# Patient Record
Sex: Male | Born: 1965
Health system: Southern US, Community
[De-identification: ages and names within clinical notes are randomized; demographics above are authoritative.]

## PROBLEM LIST (undated history)

## (undated) DIAGNOSIS — M1990 Unspecified osteoarthritis, unspecified site: Secondary | ICD-10-CM

## (undated) DIAGNOSIS — R079 Chest pain, unspecified: Secondary | ICD-10-CM

## (undated) DIAGNOSIS — F419 Anxiety disorder, unspecified: Secondary | ICD-10-CM

## (undated) DIAGNOSIS — Z8719 Personal history of other diseases of the digestive system: Secondary | ICD-10-CM

## (undated) DIAGNOSIS — R0600 Dyspnea, unspecified: Secondary | ICD-10-CM

## (undated) DIAGNOSIS — K219 Gastro-esophageal reflux disease without esophagitis: Secondary | ICD-10-CM

## (undated) HISTORY — PX: KNEE ARTHROSCOPY: SHX127

## (undated) HISTORY — PX: ANKLE SURGERY: SHX546

## (undated) HISTORY — PX: HIP SURGERY: SHX245

## (undated) HISTORY — PX: FOOT SURGERY: SHX648

## (undated) HISTORY — PX: URETHRA SURGERY: SHX824

---

## 1998-07-06 ENCOUNTER — Emergency Department (HOSPITAL_COMMUNITY): Admission: EM | Admit: 1998-07-06 | Discharge: 1998-07-06 | Payer: Self-pay | Admitting: Cardiology

## 1998-07-06 ENCOUNTER — Encounter: Payer: Self-pay | Admitting: Emergency Medicine

## 2000-09-18 ENCOUNTER — Encounter: Admission: RE | Admit: 2000-09-18 | Discharge: 2000-09-18 | Payer: Self-pay | Admitting: Family Medicine

## 2002-06-09 ENCOUNTER — Emergency Department (HOSPITAL_COMMUNITY): Admission: EM | Admit: 2002-06-09 | Discharge: 2002-06-10 | Payer: Self-pay | Admitting: Emergency Medicine

## 2002-08-18 ENCOUNTER — Emergency Department (HOSPITAL_COMMUNITY): Admission: EM | Admit: 2002-08-18 | Discharge: 2002-08-18 | Payer: Self-pay | Admitting: Emergency Medicine

## 2002-11-16 ENCOUNTER — Emergency Department (HOSPITAL_COMMUNITY): Admission: EM | Admit: 2002-11-16 | Discharge: 2002-11-16 | Payer: Self-pay | Admitting: Emergency Medicine

## 2002-11-16 ENCOUNTER — Encounter: Payer: Self-pay | Admitting: Emergency Medicine

## 2003-04-05 ENCOUNTER — Emergency Department (HOSPITAL_COMMUNITY): Admission: EM | Admit: 2003-04-05 | Discharge: 2003-04-05 | Payer: Self-pay | Admitting: Emergency Medicine

## 2003-04-05 ENCOUNTER — Encounter: Payer: Self-pay | Admitting: Emergency Medicine

## 2003-06-10 ENCOUNTER — Emergency Department (HOSPITAL_COMMUNITY): Admission: EM | Admit: 2003-06-10 | Discharge: 2003-06-10 | Payer: Self-pay | Admitting: *Deleted

## 2003-08-31 ENCOUNTER — Emergency Department (HOSPITAL_COMMUNITY): Admission: EM | Admit: 2003-08-31 | Discharge: 2003-08-31 | Payer: Self-pay | Admitting: Emergency Medicine

## 2003-10-09 ENCOUNTER — Ambulatory Visit (HOSPITAL_COMMUNITY): Admission: RE | Admit: 2003-10-09 | Discharge: 2003-10-09 | Payer: Self-pay | Admitting: *Deleted

## 2003-10-20 ENCOUNTER — Ambulatory Visit (HOSPITAL_COMMUNITY): Admission: RE | Admit: 2003-10-20 | Discharge: 2003-10-20 | Payer: Self-pay | Admitting: Orthopedic Surgery

## 2003-11-07 ENCOUNTER — Emergency Department (HOSPITAL_COMMUNITY): Admission: EM | Admit: 2003-11-07 | Discharge: 2003-11-07 | Payer: Self-pay | Admitting: Emergency Medicine

## 2003-11-09 ENCOUNTER — Ambulatory Visit (HOSPITAL_COMMUNITY): Admission: RE | Admit: 2003-11-09 | Discharge: 2003-11-09 | Payer: Self-pay | Admitting: Gastroenterology

## 2003-11-09 HISTORY — PX: ESOPHAGOGASTRODUODENOSCOPY: SHX1529

## 2003-11-11 ENCOUNTER — Ambulatory Visit (HOSPITAL_COMMUNITY): Admission: RE | Admit: 2003-11-11 | Discharge: 2003-11-11 | Payer: Self-pay | Admitting: Gastroenterology

## 2003-11-29 ENCOUNTER — Emergency Department (HOSPITAL_COMMUNITY): Admission: EM | Admit: 2003-11-29 | Discharge: 2003-11-29 | Payer: Self-pay | Admitting: Emergency Medicine

## 2004-03-01 ENCOUNTER — Emergency Department (HOSPITAL_COMMUNITY): Admission: EM | Admit: 2004-03-01 | Discharge: 2004-03-01 | Payer: Self-pay | Admitting: Emergency Medicine

## 2004-07-20 ENCOUNTER — Encounter: Admission: RE | Admit: 2004-07-20 | Discharge: 2004-07-20 | Payer: Self-pay | Admitting: Family Medicine

## 2005-01-03 ENCOUNTER — Emergency Department (HOSPITAL_COMMUNITY): Admission: EM | Admit: 2005-01-03 | Discharge: 2005-01-03 | Payer: Self-pay | Admitting: Emergency Medicine

## 2005-02-01 ENCOUNTER — Emergency Department (HOSPITAL_COMMUNITY): Admission: EM | Admit: 2005-02-01 | Discharge: 2005-02-01 | Payer: Self-pay | Admitting: Emergency Medicine

## 2006-04-07 ENCOUNTER — Encounter: Admission: RE | Admit: 2006-04-07 | Discharge: 2006-04-07 | Payer: Self-pay | Admitting: Gastroenterology

## 2006-09-08 ENCOUNTER — Encounter: Admission: RE | Admit: 2006-09-08 | Discharge: 2006-09-08 | Payer: Self-pay | Admitting: Family Medicine

## 2006-10-23 ENCOUNTER — Ambulatory Visit: Payer: Self-pay | Admitting: Internal Medicine

## 2006-10-24 ENCOUNTER — Ambulatory Visit (HOSPITAL_COMMUNITY): Admission: RE | Admit: 2006-10-24 | Discharge: 2006-10-24 | Payer: Self-pay | Admitting: Internal Medicine

## 2006-11-25 ENCOUNTER — Ambulatory Visit: Payer: Self-pay | Admitting: Internal Medicine

## 2006-12-16 ENCOUNTER — Ambulatory Visit: Payer: Self-pay | Admitting: Internal Medicine

## 2008-02-29 ENCOUNTER — Emergency Department (HOSPITAL_COMMUNITY): Admission: EM | Admit: 2008-02-29 | Discharge: 2008-02-29 | Payer: Self-pay | Admitting: Emergency Medicine

## 2008-04-16 ENCOUNTER — Emergency Department (HOSPITAL_COMMUNITY): Admission: EM | Admit: 2008-04-16 | Discharge: 2008-04-16 | Payer: Self-pay | Admitting: Emergency Medicine

## 2010-07-21 ENCOUNTER — Encounter: Payer: Self-pay | Admitting: Gastroenterology

## 2010-11-13 NOTE — Assessment & Plan Note (Signed)
[facility]                             PULMONARY OFFICE NOTE   NAME:MCCARTYLamarcus, [name]                       MRN:          [id number]  DATE:12/16/2006                            DOB:          [birth year]    PROBLEMS:  1. Chest and back pain.  2. Leg discomfort.  3. Dyspnea.   HISTORY:  He says he is sleeping poorly because crawling feeling in his  legs gets oppressive when he relaxes and keeps him awake at night.  Complains of being tired all the time, tossing and turning, catches naps  in his car.  Still has back and bilateral rib pains.   MEDICATION:  None.   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   OBJECTIVE:  Weight is 227 pounds, BP 112/62, pulse 92, room air  saturation 99%.  He is alert, maybe just a little bit depressed affect.  He is not restless, there is no tremor.  Heart sounds are regular, chest  is quiet and clear.   An MRI of his spine without contrast on October 24, 2006, showed  degenerative disc disease from T4-5 through T9-10 with no significant  abnormalities.  There was also some arthritic change in the sternum.  Pulmonary function tests on May 27 was normal except for mild slowing of  small airway flows which did respond significantly to bronchodilator,  suggesting an asthma/bronchitis consistent with his smoking.   IMPRESSION:  1. Musculoskeletal chest wall pain with degenerative disc disease.  2. Restless legs.  3. Insomnia.  4. History of bladder cancer.  5. History Legg Perthes disease with surgery.   PLAN:  1. Smoking cessation was emphasized.  2. Sleep hygiene.  3. He will follow up with his primary physicians about the      degenerative arthritis in his spine and sternum, which I think      explain the chest pains.  4. Try sample of ReQuip 0.25 mg from one to three tablets at h.s. as      discussed.  Watch for effect on daytime tiredness.   Schedule  return 3 months, earlier p.r.n.     Clinton D. Maple Hudson, MD, Tonny Bollman, FACP  Electronically Signed    CDY/MedQ  DD: 12/16/2006  DT: 12/17/2006  Job #: [id number]   cc:   Marjory Lies, M.D.

## 2010-11-16 NOTE — Op Note (Signed)
NAME:  Ryan [name], Ryan [name]                          ACCOUNT NO.:  [id number]   MEDICAL RECORD NO.:  [id number]                   PATIENT TYPE:  AMB   LOCATION:  ENDO                                 FACILITY:  [facility]   PHYSICIAN:  John C. Madilyn Fireman, M.D.                 DATE OF BIRTH:  [birth year]   DATE OF PROCEDURE:  11/09/2003  DATE OF DISCHARGE:                                 OPERATIVE REPORT   PROCEDURE:  Esophagogastroduodenoscopy.   INDICATION FOR PROCEDURE:  A 3 week history of atypical chest and abdominal  pain with apparent negative cardiac work-up and thus far failure to respond  to a proton pump inhibitor.   DESCRIPTION OF PROCEDURE:  The patient was placed in the left lateral  decubitus position and placed on the pulse monitor with continuous low-flow  oxygen delivered by nasal cannula.  He was sedated with 100 mcg IV fentanyl  and 10 mg IV Versed.  The Olympus video endoscope was advanced under direct  vision into the oropharynx and esophagus.  The esophagus was straight and of  normal caliber with the squamocolumnar line at 38 cm.  There was no visible  hiatal hernia, ring, stricture, or other abnormality of the GE junction.  The stomach was entered, and a small amount of liquid secretions were  suctioned from the fundus.  Retroflexed view of the cardia was unremarkable.  The fundus, body, antrum, and pylorus all appeared normal.  The duodenum was  entered, and both the bulb and second portion were well-inspected and  appeared to be within normal limits.  The scope was then withdrawn, and the  patient returned to the recovery room in stable condition.  He tolerated the  procedure well, and there were no immediate complications.   IMPRESSION:  Normal endoscopy.   PLAN:  Will obtain ultrasound of the liver, gallbladder, and pancreas and  obtain amylase, lipase, and liver function tests.                                               John C. Madilyn Fireman, M.D.    JCH/MEDQ  D:   11/09/2003  T:  11/09/2003  Job:  [id number]

## 2010-11-16 NOTE — Op Note (Signed)
NAME:  Ryan [name], Ryan [name]                          ACCOUNT NO.:  [id number]   MEDICAL RECORD NO.:  [id number]                   PATIENT TYPE:  AMB   LOCATION:  DAY                                  FACILITY:  [facility]   PHYSICIAN:  Georges Lynch. Gioffre, M.D.             DATE OF BIRTH:  03-02-1966   DATE OF PROCEDURE:  10/20/2003  DATE OF DISCHARGE:                                 OPERATIVE REPORT   PREOPERATIVE DIAGNOSES:  1. Possible tear of the lateral meniscus.  2. Possible tear of the medial meniscus of his left knee.   POSTOPERATIVE DIAGNOSES:  1. Complete tear of the lateral meniscus left knee.  2. Partial tear of the medial meniscus left knee.  3. 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   Note, these findings were definitely different from what the MRI showed.  He  complained about a painful popping over the lateral joint and I think it was  due to his lateral meniscus.   OPERATION:  1. Diagnostic arthroscopy, left knee.  2. Lateral meniscectomy, left knee.  3. Partial medial meniscectomy, left knee.  4. Abrasion chondroplasty medial femoral condyle, left knee.   SURGEON:  Georges Lynch. Darrelyn Hillock, M.D.   ASSISTANT:  Nurse.   DESCRIPTION OF PROCEDURE:  Under general anesthesia, routine orthopedic prep  and draping of the left lower extremity was carried out.  He was given 1 g  of IV Ancef preop.  A small punctate incision was made in the suprapatellar  pouch, inflow cannula was inserted and the knee was distended with saline.  Following that, another small punctate incision was made in the  anterolateral joint space, the arthroscope was entered and a complete  diagnostic arthroscopy was carried out.  The suprapatellar pouch was fine  except for some synovitis. Medial joint he had a peripheral tear of the  medial meniscus. I introduced the shaver suction device and did a medial  meniscectomy.  He also had chondromalacia of his femur.  I did a slight  abrasion  chondroplasty.  The cruciate's were intact, the lateral joint was  the major marked.  He had a marked overgrowth of proliferative synovium in  the lateral gutter.  I first cleaned that out and then noticed that the  reason for that was he had a complete detachment of his lateral meniscus.  I  introduced the shaver suction device and practically did a complete lateral  meniscectomy except for the posterior horn which was intact.  I thoroughly  irrigated out the knee, removed all the fluid, looked up underneath his knee  prior to doing this up under the patella and I saw no patellar defect. I  thoroughly irrigated the knee out, removed the fluid, closed all three  punctate incisions with 3-0 nylon suture. Sterile dressings were applied  after we injected 20 mL of 0.5% Marcaine and epinephrine into the knee  joint.  He was  also given 30 mg of IV Toradol.  The  patient left the operating room in satisfactory condition. He will be on  Bufferin 2 a day as an anticoagulant for two weeks and will be on Mepergan  fortis for pain.  I will see him in 12-14 days in the office for a followup  or prior to if there is a problem.                                               Ronald A. Darrelyn Hillock, M.D.    RAG/MEDQ  D:  10/20/2003  T:  10/21/2003  Job:  [id number]

## 2010-11-16 NOTE — Assessment & Plan Note (Signed)
[facility]                             PULMONARY OFFICE NOTE   NAME:[name], [name]                       MRN:          [id number]  DATE:10/23/2006                            DOB:          01-29-1966    PROBLEM:  This is a 45 year old heavy smoker referred through the  courtesy of Dr. Doristine Counter with concerns about anterior chest pain and  dyspnea.   HISTORY:  Office notes from Dr. Mellody Life office are informative. The  patient has a history of 2-3 pack per day smoking and has had complaints  of chest and epigastric pain on a chronic basis. Bone scan had indicated  costochondritis. His most recent CAT scan of the chest on 09/08/2006  showed no acute findings and no findings to explain the patient's  symptoms. Bilateral gynecomastia was mentioned. Ryan [name] says that  he has been having anterior chest pain for about two years, apparently  in two closely related patterns. Originally, it would recur as a spasm  as a twinge just left of sternum and at onset it persisted nearly two  weeks, taking him out of work. He was evaluated at the [facility] emergency  room then. Since then, this pain has occasionally been similar, but more  intense and it is usually localized across the anterior chest wall just  under the breast level. It is never fully gone, but there are spasm  episodes when it is more intense. Laying down when the pain is active  may make it worse. He feels himself tender to touch across the anterior  chest muscles. Spasms of more intense pain may last two or three hours.  Eating does not seem to affect it. It is not clear that twisting makes a  difference and no medications seem to have helped. It has never been,  again, quite as bad as it was during the first two weeks and he thinks  that something happened at that time. He notices shortness of breath and  a sense that he tires easily. It is hard to be sure whether that  complaint is directly tied  to the episodes of chest pains. He does have  some back pains, mostly at mid-thoracic level. 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 He  does physical work which involves bending, lifting, and stretching.   MEDICATIONS:  No routine medications.   ALLERGIES:  No medication allergies.   REVIEW OF SYSTEMS:  Back pains, anterior chest pains, shortness of  breath with activity and at rest, non-productive cough, sore throats,  headaches. He denies depression, significant weight change, blood,  fever, adenopathy, edema, or hot joints.   PAST HISTORY:  Malignant cells from bladder (Dr. Virgina Evener).  Costochondritis. Legperthes disease of the left hip around age 54 that  was treated surgically and leaving him prone to hip and back pains.  Surgery for ankle, knee, and hip. Urologic surgery, mostly involving  bladder lavage and follow up after identification of malignant cells.  Endoscopy for reflux evaluation.   SOCIAL HISTORY:  He  says that he quit smoking four months ago, but had  averaged two packs per day before that, sometimes three packs per day on  weekends. He is married with 3 children. 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 This is physical work.   FAMILY HISTORY:  Heart disease.   OBJECTIVE:  VITAL SIGNS:  Weight 231 pounds, blood pressure 108/62,  pulse regular 60, room air saturation 98%.  GENERAL:  He is an alert, cooperative, and rather subdued man. He is  tall, not overweight for his height.  SKIN:  No rash.  ADENOPATHY:  None found at the neck or axilla.  HEENT:  Some missing teeth, oral mucosa is clear, nasal airway and  pharynx are unremarkable, there is no neck vein distension or strider.  CHEST:  No cough, wheeze, rales, or rhonchi.  HEART:  Sounds are regular without murmur or gallop. No rub.  ABDOMEN:  He indicates some tenderness to light  pressure across the  upper epigastrium and lower bilateral anterior chest wall with no  palpable hepatosplenomegaly. I could not determine that costochondral  joints were tender.  EXTREMITIES:  No cyanosis, clubbing, or edema.   IMPRESSION:  1. Chest pains seem to be part of a pattern of recurrent chest and      abdominal pains. The temptation is to blame this on depression and      a variety of wear and tear arthritic changes. It is just possible      that the anterior chest pains reflect nerve root irritation from      degenerative arthritic changes in the spine and I have suggested an      MRI of the thoracic spine. Beyond that, he has been evaluated      enough that I doubt we would find anything specific. He may end up      benefitting most from evaluation at a pain clinic and by a      psychiatrist treated for depression.  2. Complaint of dyspnea is hardly surprising given his long standing      heavy smoking history. His chest CAT scan with contrast on      09/08/2006 was unremarkable by report. It is indeed unfortunate that      he indicates that he has stopped smoking.   PLAN:  1. We are scheduling formal PFTs and I have suggested that we wait on      medications until those are completed.  2. I suggested an MRI of the thoracic spine and this has been approved      by his insurance screener. He may have a fair amount of      degenerative change and spurring in the thoracic spine contributing      to some of his pain. Otherwise, he may benefit most from some kind      of a stretching and relaxing program.  3. We are scheduling return in about 3 weeks or whenever his study      gets completed. I appreciate the chance to meet him and hope that I      can be of help in some way.     Clinton D. Maple Hudson, MD, Tonny Bollman, FACP  Electronically Signed    CDY/MedQ  DD: 10/23/2006  DT: 10/24/2006  Job #: [id number]  cc:   Marjory Lies, M.D.

## 2011-04-14 ENCOUNTER — Inpatient Hospital Stay (INDEPENDENT_AMBULATORY_CARE_PROVIDER_SITE_OTHER)
Admission: RE | Admit: 2011-04-14 | Discharge: 2011-04-14 | Disposition: A | Discharge status: Home / Self Care | Payer: BC Managed Care – PPO | Source: Ambulatory Visit | Attending: Family Medicine | Admitting: Family Medicine

## 2011-04-14 ENCOUNTER — Ambulatory Visit (INDEPENDENT_AMBULATORY_CARE_PROVIDER_SITE_OTHER): Payer: BC Managed Care – PPO

## 2011-04-14 DIAGNOSIS — J40 Bronchitis, not specified as acute or chronic: Secondary | ICD-10-CM

## 2011-04-14 DIAGNOSIS — H6980 Other specified disorders of Eustachian tube, unspecified ear: Secondary | ICD-10-CM

## 2011-09-26 ENCOUNTER — Emergency Department (HOSPITAL_COMMUNITY): Payer: BC Managed Care – PPO

## 2011-09-26 ENCOUNTER — Emergency Department (HOSPITAL_COMMUNITY)
Admission: EM | Admit: 2011-09-26 | Discharge: 2011-09-26 | Disposition: A | Discharge status: Home / Self Care | Payer: BC Managed Care – PPO | Attending: Emergency Medicine | Admitting: Emergency Medicine

## 2011-09-26 ENCOUNTER — Encounter (HOSPITAL_COMMUNITY): Payer: Self-pay | Admitting: Emergency Medicine

## 2011-09-26 DIAGNOSIS — S92309A Fracture of unspecified metatarsal bone(s), unspecified foot, initial encounter for closed fracture: Secondary | ICD-10-CM | POA: Insufficient documentation

## 2011-09-26 DIAGNOSIS — S92353A Displaced fracture of fifth metatarsal bone, unspecified foot, initial encounter for closed fracture: Secondary | ICD-10-CM

## 2011-09-26 DIAGNOSIS — F17200 Nicotine dependence, unspecified, uncomplicated: Secondary | ICD-10-CM | POA: Insufficient documentation

## 2011-09-26 MED ORDER — IBUPROFEN 800 MG PO TABS: 800.0000 mg | ORAL_TABLET | Freq: Three times a day (TID) | ORAL | Status: AC

## 2011-09-26 MED ORDER — OXYCODONE-ACETAMINOPHEN 5-325 MG PO TABS: 1.0000 | ORAL_TABLET | ORAL | Status: AC | PRN
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
## 2011-09-26 NOTE — ED Provider Notes (Signed)
Medical screening examination/treatment/procedure(s) were performed by non-physician practitioner and as supervising physician I was immediately available for consultation/collaboration.  Donnetta Hutching, MD 09/26/11 [phone]

## 2011-09-26 NOTE — Discharge Instructions (Signed)
You have a fracture of your 5th metatarsal in your foot. This type of fracture may need a surgical repair in order to heal. Make sure to keep foot elevated, no bearing weight, use crutches. Ice it several times a day. Ibuprofen and percocet for pain. Call  orthopedics today for a close follow up appointment next week.   Foot Fracture Your caregiver has diagnosed you as having a foot fracture (broken bone). Your foot has many bones. You have a fracture, or break, in one of these bones. In some cases, your doctor may put on a splint or removable fracture boot until the swelling in your foot has lessened. A cast may or may not be required. HOME CARE INSTRUCTIONS  If you do not have a cast or splint:  You may bear weight on your injured foot as tolerated or advised.   Do not put any weight on your injured foot for as long as directed by your caregiver. Slowly increase the amount of time you walk on the foot as the pain and swelling allows or as advised.   Use crutches until you can bear weight without pain. A gradual increase in weight bearing may help.   Apply ice to the injury for 15 to 20 minutes each hour while awake for the first 2 days. Put the ice in a plastic bag and place a towel between the bag of ice and your skin.   If an ace bandage (stretchy, elastic wrapping bandage) was applied, you may re-wrap it if ankle is more painful or your toes become cold and swollen.  If you have a cast or splint:  Use your crutches for as long as directed by your caregiver.   To lessen the swelling, keep the injured foot elevated on pillows while lying down or sitting. Elevate your foot above your heart.   Apply ice to the injury for 15 to 20 minutes each hour while awake for the first 2 days. Put the ice in a plastic bag and place a thin towel between the bag of ice and your cast.   Plaster or fiberglass cast:   Do not try to scratch the skin under the cast using a sharp or pointed object  down the cast.   Check the skin around the cast every day. You may put lotion on any red or sore areas.   Keep your cast clean and dry.   Plaster splint:   Wear the splint until you are seen for a follow-up examination.   You may loosen the elastic around the splint if your toes become numb, tingle, or turn blue or cold. Do not rest it on anything harder than a pillow in the first 24 hours.   Do not put pressure on any part of your splint. Use your crutches as directed.   Keep your splint dry. It can be protected during bathing with a plastic bag. Do not lower the splint into water.   If you have a fracture boot you may remove it to shower. Bear weight only as instructed by your caregiver.   Only take over-the-counter or prescription medicines for pain, discomfort, or fever as directed by your caregiver.  SEEK IMMEDIATE MEDICAL CARE IF:   Your cast gets damaged or breaks.   You have continued severe pain or more swelling than you did before the cast was put on.   Your skin or nails of your casted foot turn blue, gray, feel cold or numb.   There is  a bad smell from your cast.   There is severe pain with movement of your toes.   There are new stains and/or drainage coming from under the cast.  MAKE SURE YOU:   Understand these instructions.   Will watch your condition.   Will get help right away if you are not doing well or get worse.  Document Released: 06/14/2000 Document Revised: 06/06/2011 Document Reviewed: 07/21/2008 [facility] Patient Information 2012 [place], [place].

## 2011-11-14 ENCOUNTER — Other Ambulatory Visit: Payer: Self-pay | Admitting: Orthopedic Surgery

## 2011-11-14 DIAGNOSIS — M1990 Unspecified osteoarthritis, unspecified site: Secondary | ICD-10-CM

## 2011-11-14 DIAGNOSIS — M25559 Pain in unspecified hip: Secondary | ICD-10-CM

## 2011-11-18 ENCOUNTER — Ambulatory Visit
Admission: RE | Admit: 2011-11-18 | Discharge: 2011-11-18 | Disposition: A | Discharge status: Home / Self Care | Payer: BC Managed Care – PPO | Source: Ambulatory Visit | Attending: Orthopedic Surgery | Admitting: Orthopedic Surgery

## 2011-11-18 DIAGNOSIS — M1990 Unspecified osteoarthritis, unspecified site: Secondary | ICD-10-CM

## 2011-11-18 DIAGNOSIS — M25559 Pain in unspecified hip: Secondary | ICD-10-CM

## 2011-11-18 MED ORDER — IOHEXOL 180 MG/ML  SOLN
1.0000 mL | Freq: Once | INTRAMUSCULAR | Status: AC | PRN
  Administered 2011-11-18: 1 mL via INTRA_ARTICULAR

## 2011-11-18 MED ORDER — METHYLPREDNISOLONE ACETATE 40 MG/ML INJ SUSP (RADIOLOG
120.0000 mg | Freq: Once | INTRAMUSCULAR | Status: AC
  Administered 2011-11-18: 120 mg via INTRA_ARTICULAR

## 2011-11-27 ENCOUNTER — Ambulatory Visit (INDEPENDENT_AMBULATORY_CARE_PROVIDER_SITE_OTHER): Payer: BC Managed Care – PPO | Admitting: Family Medicine

## 2011-11-27 VITALS — BP 137/80 | HR 80 | Temp 98.4°F | Resp 18 | Ht 74.0 in | Wt 230.0 lb

## 2011-11-27 DIAGNOSIS — T1590XA Foreign body on external eye, part unspecified, unspecified eye, initial encounter: Secondary | ICD-10-CM

## 2011-11-27 DIAGNOSIS — T1500XA Foreign body in cornea, unspecified eye, initial encounter: Secondary | ICD-10-CM

## 2011-11-27 MED ORDER — ERYTHROMYCIN 5 MG/GM OP OINT: TOPICAL_OINTMENT | Freq: Four times a day (QID) | OPHTHALMIC | Status: AC

## 2011-11-27 NOTE — Progress Notes (Signed)
  Patient Name: Ryan [name] Date of Birth: 17-Aug-1965 Medical Record Number: [id number] Gender: male Date of Encounter: 11/27/2011  History of Present Illness:  Ryan [name] is a 46 y.o. very pleasant male patient who presents with the following:  He was grinding an exhaust pipe and felt something go into his left eye- he was wearing his glasses but the piece went around the glasses frame.  No contacts used.  He thought he had washed out the FB, but then when he went out in the light he noted eye irritation and could still see a "tiny speck".  His vision seems normal, but he has photophobia to sunlight- indoor lights ok  There is no problem list on file for this patient.  No past medical history on file. No past surgical history on file. History  Substance Use Topics  . Smoking status: Former Smoker -- 1.0 packs/day    Types: Cigarettes    Quit date: 06/29/2011  . Smokeless tobacco: Not on file  . Alcohol Use: Yes   No family history on file. No Known Allergies  Medication list has been reviewed and updated.  Review of Systems: As per HPI- otherwise negative.   Physical Examination: Filed Vitals:   11/27/11 1344  BP: 137/80  Pulse: 80  Temp: 98.4 F (36.9 C)  TempSrc: Oral  Resp: 18  Height: 6\' 2"  (1.88 m)  Weight: 230 lb (104.327 kg)    Body mass index is 29.53 kg/(m^2).   GEN: WDWN, NAD, Non-toxic, Alert & Oriented x 3 HEENT: Atraumatic, Normocephalic.  PEERL, EOMI. Fundoscopic exam wnl. Placed proparacaine drops in left eye.  Examined- noted tiny speck of foreign matter on cornea over pupil.  Removed with q- tip.  Placed fluorescin dye- no other abnormality noted.   Ears and Nose: No external deformity. EXTR: No clubbing/cyanosis/edema NEURO: Normal gait.  PSYCH: Normally interactive. Conversant. Not depressed or anxious appearing.  Calm demeanor.    Assessment and Plan: 1. Eye foreign body  erythromycin [facility]) ophthalmic ointment   FB in eye-  removed as above.  Start erythromycin ointment to prevent infection and lubricate eye.  As it was a metallic FB encouraged him to RTC for a recheck in 24-48 hours, especially if symptoms not 100% resolved.  He is UTD on tetanus shot per his report.

## 2011-12-10 ENCOUNTER — Encounter (HOSPITAL_COMMUNITY): Payer: Self-pay | Admitting: *Deleted

## 2011-12-10 ENCOUNTER — Emergency Department (HOSPITAL_COMMUNITY)
Admission: EM | Admit: 2011-12-10 | Discharge: 2011-12-10 | Disposition: A | Discharge status: Home / Self Care | Payer: BC Managed Care – PPO | Attending: Emergency Medicine | Admitting: Emergency Medicine

## 2011-12-10 ENCOUNTER — Emergency Department (HOSPITAL_COMMUNITY): Payer: BC Managed Care – PPO

## 2011-12-10 DIAGNOSIS — M542 Cervicalgia: Secondary | ICD-10-CM

## 2011-12-10 DIAGNOSIS — M79643 Pain in unspecified hand: Secondary | ICD-10-CM

## 2011-12-10 DIAGNOSIS — M7710 Lateral epicondylitis, unspecified elbow: Secondary | ICD-10-CM

## 2011-12-10 DIAGNOSIS — M79609 Pain in unspecified limb: Secondary | ICD-10-CM | POA: Insufficient documentation

## 2011-12-10 DIAGNOSIS — Z87891 Personal history of nicotine dependence: Secondary | ICD-10-CM | POA: Insufficient documentation

## 2011-12-10 MED ORDER — HYDROCODONE-ACETAMINOPHEN 5-325 MG PO TABS: 1.0000 | ORAL_TABLET | Freq: Four times a day (QID) | ORAL | Status: AC | PRN

## 2011-12-10 MED ORDER — NAPROXEN 375 MG PO TABS: 375.0000 mg | ORAL_TABLET | Freq: Two times a day (BID) | ORAL | Status: AC

## 2011-12-10 MED ORDER — KETOROLAC TROMETHAMINE 60 MG/2ML IM SOLN
60.0000 mg | Freq: Once | INTRAMUSCULAR | Status: AC
  Administered 2011-12-10: 60 mg via INTRAMUSCULAR
  Filled 2011-12-10: qty 2

## 2011-12-10 NOTE — ED Notes (Signed)
[id number]<YQ> Expected date:<BR> Expected time:<BR> Means of arrival:<BR> Comments:<BR> 55 yom ankle pain

## 2011-12-10 NOTE — ED Provider Notes (Signed)
Medical screening examination/treatment/procedure(s) were performed by non-physician practitioner and as supervising physician I was immediately available for consultation/collaboration.   Lyanne Co, MD 12/10/11 1310

## 2011-12-10 NOTE — ED Notes (Signed)
Pt states "I've had carpal tunnel problems with this hand before but for the last 3 days, I've been meaning to call the orthopaedic but haven't, it hurts from the elbow down"; pt indicates right arm, CMS <2 sec, warm; pt states "it's hard to move my fingers"

## 2011-12-10 NOTE — Discharge Instructions (Signed)
Lateral Epicondylitis (Tennis Elbow)  Lateral epicondylitis involves inflammation and pain around the outer portion of the elbow. The pain is caused by inflammation of the tendons in the forearm that bring back (extend) the wrist. Lateral epicondylittis is also called tennis elbow, because it is very common in tennis players. However, it may occur in any individual who extends the wrist repetitively. If lateral epicondylitis is left untreated, it may become a chronic problem. SYMPTOMS   Pain, tenderness, and inflammation on the outer (lateral) side of the elbow.   Pain or weakness with gripping activities.   Pain that increases with wrist twisting motions (playing tennis, using a screwdriver, opening a door or a jar).   Pain with lifting objects, including a coffee cup.  CAUSES  Lateral epicondylitis is caused by inflammation of the tendons that extend the wrist. Causes of injury may include:  Repetitive stress and strain on the muscles and tendons that extend the wrist.   Sudden change in activity level or intensity.   Incorrect grip in racquet sports.   Incorrect grip size of racquet (often too large).   Incorrect hitting position or technique (usually backhand, leading with the elbow).   Using a racket that is too heavy.  RISK INCREASES WITH:  Sports or occupations that require repetitive and/or strenuous forearm and wrist movements (tennis, squash, racquetball, carpentry).   Poor wrist and forearm strength and flexibility.   Failure to warm up properly before activity.   Resuming activity before healing, rehabilitation, and conditioning are complete.  PREVENTION   Warm up and stretch properly before activity.   Maintain physical fitness:   Strength, flexibility, and endurance.   Cardiovascular fitness.   Wear and use properly fitted equipment.   Learn and use proper technique and have a coach correct improper technique.   Wear a tennis elbow (counterforce) brace.    PROGNOSIS  The course of this condition depends on the degree of the injury. If treated properly, acute cases (symptoms lasting less than 4 weeks) are often resolved in 2 to 6 weeks. Chronic (longer lasting cases) often resolve in 3 to 6 months, but may require physical therapy. RELATED COMPLICATIONS   Frequently recurring symptoms, resulting in a chronic problem. Properly treating the problem the first time decreases frequency of recurrence.   Chronic inflammation, scarring tendon degeneration, and partial tendon tear, requiring surgery.   Delayed healing or resolution of symptoms.  TREATMENT  Treatment first involves the use of ice and medicine, to reduce pain and inflammation. Strengthening and stretching exercises may help reduce discomfort, if performed regularly. These exercises may be performed at home, if the condition is an acute injury. Chronic cases may require a referral to a physical therapist for evaluation and treatment. Your caregiver may advise a corticosteroid injection, to help reduce inflammation. Rarely, surgery is needed. MEDICATION  If pain medicine is needed, nonsteroidal anti-inflammatory medicines (aspirin and ibuprofen), or other minor pain relievers (acetaminophen), are often advised.   Do not take pain medicine for 7 days before surgery.   Prescription pain relievers may be given, if your caregiver thinks they are needed. Use only as directed and only as much as you need.   Corticosteroid injections may be recommended. These injections should be reserved only for the most severe cases, because they can only be given a certain number of times.  HEAT AND COLD  Cold treatment (icing) should be applied for 10 to 15 minutes every 2 to 3 hours for inflammation and pain, and immediately  after activity that aggravates your symptoms. Use ice packs or an ice massage.   Heat treatment may be used before performing stretching and strengthening activities prescribed by your  caregiver, physical therapist, or athletic trainer. Use a heat pack or a warm water soak.  SEEK MEDICAL CARE IF: Symptoms get worse or do not improve in 2 weeks, despite treatment.

## 2011-12-10 NOTE — ED Provider Notes (Signed)
History     CSN: [id number]  Arrival date & time 12/10/11  1055   First MD Initiated Contact with Patient 12/10/11 1125      Chief Complaint  Patient presents with  . Arm Pain    (Consider location/radiation/quality/duration/timing/severity/associated sxs/prior treatment) HPI Comments: Patient with a history of multiple orthopedic problems including hip knee ankle and foot surgery presents emergency department with a chief complaint of right arm pain.  Patient states that his elbow kills him with any range of motion activities and that he has numbness and tingling of the extremity as well as pain in his right hand.  Patient has a history of carpal tunnel syndrome of the right hand has never been operated on.  Patient denies any weakness of the extremity but states that he's in able to do things do to pain.  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  Onset of symptoms have been 2-3 years however symptoms became unbearable today while at work this morning.  Patient denies any fevers, night sweats, chills, swelling, erythema, warmth of joint, or weakness.  Patient states that he has some cervical neck pain as well but that this has been occurring for the last 8 years and is being followed by [facility] orthopedics.  Patient is a 46 y.o. male presenting with arm pain. The history is provided by the patient.  Arm Pain Associated symptoms include arthralgias and myalgias. Pertinent negatives include no joint swelling.    History reviewed. No pertinent past medical history.  Past Surgical History  Procedure Date  . Foot surgery     right  . Ankle surgery     reconstruction to muscle  . Knee surgery     left arthroscopy  . Hip surgery     left    No family history on file.  History  Substance Use Topics  . Smoking status: Former Smoker -- 1.0 packs/day    Quit date: 06/29/2011  . Smokeless tobacco: Not on file  . Alcohol Use:  Yes     ocassionally      Review of Systems  Musculoskeletal: Positive for myalgias, back pain and arthralgias. Negative for joint swelling and gait problem.  All other systems reviewed and are negative.    Allergies  Review of patient's allergies indicates no known allergies.  Home Medications   Current Outpatient Rx  Name Route Sig Dispense Refill  . HYDROCODONE-ACETAMINOPHEN 5-325 MG PO TABS Oral Take 1 tablet by mouth every 6 (six) hours as needed. For pain    . ZOLPIDEM TARTRATE 5 MG PO TABS Oral Take 5 mg by mouth at bedtime as needed.    Marland Kitchen HYDROCODONE-ACETAMINOPHEN 5-325 MG PO TABS Oral Take 1 tablet by mouth every 6 (six) hours as needed for pain. 15 tablet 0  . NAPROXEN 375 MG PO TABS Oral Take 1 tablet (375 mg total) by mouth 2 (two) times daily. 20 tablet 0    BP 116/79  Pulse 66  Temp(Src) 98.1 F (36.7 C) (Oral)  Resp 19  Wt 240 lb (108.863 kg)  SpO2 98%  Physical Exam  Nursing note and vitals reviewed. Constitutional: He is oriented to person, place, and time. He appears well-developed and well-nourished. No distress.  HENT:  Head: Normocephalic and atraumatic.  Eyes: Conjunctivae and EOM are normal.  Neck: Normal range of motion.  Pulmonary/Chest: Effort normal.  Musculoskeletal:       Right elbow: He exhibits no swelling, no  effusion, no deformity and no laceration. tenderness found. Lateral epicondyle tenderness noted.       Right wrist: Normal. He exhibits no tenderness (negative Phalen sign), no bony tenderness, no swelling and no effusion.       Right hand: He exhibits decreased range of motion. He exhibits no tenderness, no bony tenderness, normal capillary refill and no swelling. normal sensation noted. Normal strength noted.  Neurological: He is alert and oriented to person, place, and time.  Skin: Skin is warm and dry. No rash noted. He is not diaphoretic.  Psychiatric: He has a normal mood and affect. His behavior is normal.    ED Course    Procedures (including critical care time)  Labs Reviewed - No data to display Dg Cervical Spine Complete  12/10/2011  *RADIOLOGY REPORT*  Clinical Data: Pain on right side of neck for year and a half. Pain travels into right arm and fingers.  No injury.  CERVICAL SPINE - COMPLETE 4+ VIEW  Comparison: None.  Findings: Cervical spine is imaged from the skull base through the inferior endplate of C6.  The C7 and T1 vertebral bodies are obscured by the patient's shoulders on the lateral and swimmers views.  The cervical spine is normally aligned from the skull base through the C6 vertebral body.  There is moderate disc height narrowing with prominent osteophyte formation at C5-C6.  There is slight disc height narrowing especially posteriorly at C3-4.  The lateral masses of C1 and C2 are aligned.  There is mild bony neural foraminal narrowing on the right and C3- 4, C5-6 and C6-7.  There is mild bony neural foraminal narrowing on the left at C5-6 and C6-7.  No fracture is identified.  The prevertebral soft tissue contour is normal.  IMPRESSION:  1.  The C7 and T1 vertebral bodies are not visualized; they are obscured by the patient's shoulders. 2. Moderate disc height loss and osteophyte formation at C5-C6 and mild disc height loss at C3-C4. 3.  Bony neural foraminal narrowing on the right at C3-4, C5-6, and C6-7. 4.  Bony neural foraminal narrowing on the left at C5-6 and C6-7.  Original Report Authenticated By: Britta Mccreedy, M.D.     1. Lateral epicondylitis  of elbow   2. Hand pain       MDM  Lateral epicondylitis   Patient X-Ray negative for obvious fracture or dislocation of cervical spine. Pain managed in ED. Pt d/t chronic wrist, neck & elbow pain. Compression band recommended. Conservative therapy recommended and discussed. Patient will be dc home & is agreeable with above plan.         Jaci Carrel, [place] 12/10/11 1301

## 2012-03-20 ENCOUNTER — Ambulatory Visit (HOSPITAL_COMMUNITY)
Admission: RE | Admit: 2012-03-20 | Discharge: 2012-03-20 | Disposition: A | Discharge status: Home / Self Care | Payer: BC Managed Care – PPO | Source: Ambulatory Visit | Attending: Orthopedic Surgery | Admitting: Orthopedic Surgery

## 2012-03-20 ENCOUNTER — Other Ambulatory Visit (HOSPITAL_COMMUNITY): Payer: Self-pay | Admitting: Orthopedic Surgery

## 2012-03-20 DIAGNOSIS — M25562 Pain in left knee: Secondary | ICD-10-CM

## 2012-03-20 DIAGNOSIS — M25569 Pain in unspecified knee: Secondary | ICD-10-CM | POA: Insufficient documentation

## 2012-03-20 DIAGNOSIS — Z1389 Encounter for screening for other disorder: Secondary | ICD-10-CM | POA: Insufficient documentation

## 2013-02-09 ENCOUNTER — Encounter (HOSPITAL_COMMUNITY): Payer: Self-pay | Admitting: Pharmacy Technician

## 2013-02-15 NOTE — Progress Notes (Signed)
LOV note 11/02/12 Dr. Link Snuffer on chart, Chest x-ray 11/02/12 on chart

## 2013-02-15 NOTE — Patient Instructions (Addendum)
20 HARRIE CAZAREZ  02/15/2013   Your procedure is scheduled on:02/23/13   Report to [facility] at 5:15 AM.  Call this number if you have problems the morning of surgery [phone]   Remember:   Do not eat food or drink liquids After Midnight.     Take these medicines the morning of surgery with A SIP OF WATER: no meds to take   Do not wear jewelry, make-up or nail polish.  Do not wear lotions, powders, or perfumes. You may wear deodorant.  Do not shave 48 hours prior to surgery. Men may shave face and neck.  Do not bring valuables to the hospital.  Contacts, dentures or bridgework may not be worn into surgery.  Leave suitcase in the car. After surgery it may be brought to your room.  For patients admitted to the hospital, checkout time is 11:00 AM the day of discharge.    Please read over the following fact sheets that you were given: MRSA Information, incentive spirometry fact sheet, blood fact sheet. Cain Sieve, RN  pre op nurse call if needed [phone]    FAILURE TO FOLLOW THESE INSTRUCTIONS MAY RESULT IN CANCELLATION OF YOUR SURGERY   Patient Signature: ___________________________________________

## 2013-02-17 ENCOUNTER — Encounter (HOSPITAL_COMMUNITY): Payer: Self-pay

## 2013-02-17 ENCOUNTER — Encounter (HOSPITAL_COMMUNITY)
Admission: RE | Admit: 2013-02-17 | Discharge: 2013-02-17 | Disposition: A | Discharge status: Home / Self Care | Payer: BC Managed Care – PPO | Source: Ambulatory Visit | Attending: Orthopedic Surgery | Admitting: Orthopedic Surgery

## 2013-02-17 DIAGNOSIS — Z01812 Encounter for preprocedural laboratory examination: Secondary | ICD-10-CM | POA: Insufficient documentation

## 2013-02-17 DIAGNOSIS — I498 Other specified cardiac arrhythmias: Secondary | ICD-10-CM | POA: Insufficient documentation

## 2013-02-17 DIAGNOSIS — Z01810 Encounter for preprocedural cardiovascular examination: Secondary | ICD-10-CM | POA: Insufficient documentation

## 2013-02-17 HISTORY — DX: Chest pain, unspecified: R07.9

## 2013-02-17 HISTORY — DX: Unspecified osteoarthritis, unspecified site: M19.90

## 2013-02-17 HISTORY — DX: Gastro-esophageal reflux disease without esophagitis: K21.9

## 2013-02-17 HISTORY — DX: Personal history of other diseases of the digestive system: Z87.19

## 2013-02-17 LAB — URINALYSIS, ROUTINE W REFLEX MICROSCOPIC
Bilirubin Urine: NEGATIVE
Hgb urine dipstick: NEGATIVE
Protein, ur: NEGATIVE mg/dL
Urobilinogen, UA: 0.2 mg/dL (ref 0.0–1.0)

## 2013-02-17 LAB — BASIC METABOLIC PANEL
Calcium: 9.9 mg/dL (ref 8.4–10.5)
GFR calc non Af Amer: >90 mL/min (ref >90)
Glucose, Bld: 117 mg/dL — ABNORMAL HIGH (ref 70–99)
Sodium: 140 mEq/L (ref 135–145)

## 2013-02-17 LAB — CBC
MCH: 32.2 pg (ref 26.0–34.0)
Platelets: 279 10*3/uL (ref 150–400)
RBC: 4.56 MIL/uL (ref 4.22–5.81)
WBC: 8.2 10*3/uL (ref 4.0–10.5)

## 2013-02-17 LAB — APTT: aPTT: 29 seconds (ref 24–37)

## 2013-02-17 LAB — PROTIME-INR: Prothrombin Time: 12.9 seconds (ref 11.6–15.2)

## 2013-02-17 LAB — SURGICAL PCR SCREEN: MRSA, PCR: NEGATIVE

## 2013-02-18 NOTE — H&P (Signed)
TOTAL HIP ADMISSION H&P  Patient is admitted for left total hip arthroplasty, anterior approach.  Subjective:  Chief Complaint: Left hip OA / pain  HPI: Ryan [name], 47 y.o. male, has a history of pain and functional disability in the left hip(s) due to arthritis and patient has failed non-surgical conservative treatments for greater than 12 weeks to include NSAID's and/or analgesics, corticosteriod injections, use of assistive devices and activity modification.  Onset of symptoms was gradual starting >10 years ago with rapidlly worsening for the last 2 years. The patient noted prior procedures of the hip to include multiple procedures as a child on the left hip(s).  Patient currently rates pain in the left hip at 10 out of 10 with activity. Patient has night pain, worsening of pain with activity and weight bearing, trendelenberg gait, pain that interfers with activities of daily living, pain with passive range of motion and crepitus. Patient has evidence of periarticular osteophytes and joint space narrowing by imaging studies. This condition presents safety issues increasing the risk of falls.  There is no current active signs of infection.  Risks, benefits and expectations were discussed with the patient. Patient understand the risks, benefits and expectations and wishes to proceed with surgery.   D/C Plans:   Home with HHPT  Post-op Meds:     Rx given for ASA, Robaxin, Iron, Colace and MiraLax  Tranexamic Acid:   To be given  Decadron:    To be given  FYI:    ASA post-op  Norco post-op    Past Medical History  Diagnosis Date  . Chest pain since 02-11-2013    pt thinks associated with reflux  . H/O hiatal hernia   . GERD (gastroesophageal reflux disease)   . Arthritis     oa    Past Surgical History  Procedure Laterality Date  . Foot surgery      right  . Ankle surgery Right     reconstruction to muscle  . Knee surgery      left arthroscopy x 2  . Hip surgery  age 16   left, done x 2  . Urethra surgery   38yrs ago    for scar tissue    No Known Allergies   History  Substance Use Topics  . Smoking status: Current Every Day Smoker -- 1.50 packs/day for 25 years    Types: Cigarettes  . Smokeless tobacco: Never Used  . Alcohol Use: Yes     Comment: ocassionally      Review of Systems  Constitutional: Negative.   HENT: Negative.   Eyes: Negative.   Respiratory: Negative.   Cardiovascular: Negative.   Gastrointestinal: Positive for heartburn.  Genitourinary: Negative.   Musculoskeletal: Positive for joint pain.  Skin: Negative.   Neurological: Negative.   Endo/Heme/Allergies: Negative.   Psychiatric/Behavioral: Negative.     Objective:  Physical Exam  Constitutional: He is oriented to person, place, and time. He appears well-developed and well-nourished.  HENT:  Head: Normocephalic and atraumatic.  Mouth/Throat: Oropharynx is clear and moist.  Eyes: Pupils are equal, round, and reactive to light.  Neck: Neck supple. No JVD present. No tracheal deviation present. No thyromegaly present.  Cardiovascular: Normal rate, regular rhythm, normal heart sounds and intact distal pulses.   Respiratory: Effort normal and breath sounds normal. No stridor. No respiratory distress. He has no wheezes.  GI: Soft. There is no tenderness. There is no guarding.  Musculoskeletal:       Left hip: He  exhibits decreased range of motion, decreased strength, tenderness, bony tenderness and crepitus. He exhibits no swelling, no deformity and no laceration.  Lymphadenopathy:    He has no cervical adenopathy.  Neurological: He is alert and oriented to person, place, and time.  Skin: Skin is warm and dry.  Psychiatric: He has a normal mood and affect.     Labs:  Estimated body mass index is 30.80 kg/(m^2) as calculated from the following:   Height as of 11/27/11: 6\' 2"  (1.88 m).   Weight as of 12/10/11: 108.863 kg (240 lb).   Imaging Review Plain radiographs  demonstrate severe degenerative joint disease of the left hip(s). The bone quality appears to be good for age and reported activity level.  Assessment/Plan:  End stage arthritis, left hip(s)  The patient history, physical examination, clinical judgement of the provider and imaging studies are consistent with end stage degenerative joint disease of the left hip(s) and total hip arthroplasty is deemed medically necessary. The treatment options including medical management, injection therapy, arthroscopy and arthroplasty were discussed at length. The risks and benefits of total hip arthroplasty were presented and reviewed. The risks due to aseptic loosening, infection, stiffness, dislocation/subluxation,  thromboembolic complications and other imponderables were discussed.  The patient acknowledged the explanation, agreed to proceed with the plan and consent was signed. Patient is being admitted for inpatient treatment for surgery, pain control, PT, OT, prophylactic antibiotics, VTE prophylaxis, progressive ambulation and ADL's and discharge planning.The patient is planning to be discharged home with home health services.     Anastasio Auerbach Ineze Serrao   PAC  02/18/2013, 12:07 PM

## 2013-02-22 NOTE — Anesthesia Preprocedure Evaluation (Addendum)
Anesthesia Evaluation  Patient identified by MRN, date of birth, ID band Patient awake    Reviewed: Allergy & Precautions, H&P , NPO status , Patient's Chart, lab work & pertinent test results  Airway Mallampati: II TM Distance: >3 FB Neck ROM: Full    Dental no notable dental hx.    Pulmonary neg pulmonary ROS,  breath sounds clear to auscultation  Pulmonary exam normal       Cardiovascular Exercise Tolerance: Good negative cardio ROS  Rhythm:Regular Rate:Normal  ECG: SB 52   Neuro/Psych negative neurological ROS  negative psych ROS   GI/Hepatic Neg liver ROS, hiatal hernia, GERD-  Medicated,  Endo/Other  negative endocrine ROS  Renal/GU negative Renal ROS  negative genitourinary   Musculoskeletal negative musculoskeletal ROS (+)   Abdominal   Peds negative pediatric ROS (+)  Hematology negative hematology ROS (+)   Anesthesia Other Findings   Reproductive/Obstetrics negative OB ROS                          Anesthesia Physical Anesthesia Plan  ASA: I  Anesthesia Plan: General   Post-op Pain Management:    Induction: Intravenous  Airway Management Planned: Oral ETT  Additional Equipment:   Intra-op Plan:   Post-operative Plan: Extubation in OR  Informed Consent: I have reviewed the patients History and Physical, chart, labs and discussed the procedure including the risks, benefits and alternatives for the proposed anesthesia with the patient or authorized representative who has indicated his/her understanding and acceptance.   Dental advisory given  Plan Discussed with: CRNA  Anesthesia Plan Comments: (Discussed risks/benefits of general versus spinal. Patient prefers general.)      Anesthesia Quick Evaluation

## 2013-02-23 ENCOUNTER — Inpatient Hospital Stay (HOSPITAL_COMMUNITY): Payer: BC Managed Care – PPO

## 2013-02-23 ENCOUNTER — Encounter (HOSPITAL_COMMUNITY): Payer: Self-pay | Admitting: Anesthesiology

## 2013-02-23 ENCOUNTER — Ambulatory Visit (HOSPITAL_COMMUNITY): Payer: BC Managed Care – PPO | Admitting: Anesthesiology

## 2013-02-23 ENCOUNTER — Encounter (HOSPITAL_COMMUNITY): Payer: Self-pay | Admitting: *Deleted

## 2013-02-23 ENCOUNTER — Inpatient Hospital Stay (HOSPITAL_COMMUNITY)
Admission: RE | Admit: 2013-02-23 | Discharge: 2013-02-24 | DRG: 818 | Disposition: A | Discharge status: Home Health | Payer: BC Managed Care – PPO | Source: Ambulatory Visit | Attending: Orthopedic Surgery | Admitting: Orthopedic Surgery

## 2013-02-23 ENCOUNTER — Ambulatory Visit (HOSPITAL_COMMUNITY): Payer: BC Managed Care – PPO

## 2013-02-23 ENCOUNTER — Encounter (HOSPITAL_COMMUNITY)
Admission: RE | Disposition: A | Discharge status: Home Health | Payer: Self-pay | Source: Ambulatory Visit | Attending: Orthopedic Surgery

## 2013-02-23 DIAGNOSIS — D500 Iron deficiency anemia secondary to blood loss (chronic): Secondary | ICD-10-CM | POA: Diagnosis not present

## 2013-02-23 DIAGNOSIS — Z96649 Presence of unspecified artificial hip joint: Secondary | ICD-10-CM

## 2013-02-23 DIAGNOSIS — F17200 Nicotine dependence, unspecified, uncomplicated: Secondary | ICD-10-CM | POA: Diagnosis present

## 2013-02-23 DIAGNOSIS — M169 Osteoarthritis of hip, unspecified: Principal | ICD-10-CM | POA: Diagnosis present

## 2013-02-23 DIAGNOSIS — D62 Acute posthemorrhagic anemia: Secondary | ICD-10-CM | POA: Diagnosis not present

## 2013-02-23 DIAGNOSIS — Z01810 Encounter for preprocedural cardiovascular examination: Secondary | ICD-10-CM

## 2013-02-23 DIAGNOSIS — E663 Overweight: Secondary | ICD-10-CM

## 2013-02-23 DIAGNOSIS — K219 Gastro-esophageal reflux disease without esophagitis: Secondary | ICD-10-CM | POA: Diagnosis present

## 2013-02-23 DIAGNOSIS — Z6828 Body mass index (BMI) 28.0-28.9, adult: Secondary | ICD-10-CM

## 2013-02-23 DIAGNOSIS — M1610 Unilateral primary osteoarthritis, unspecified hip: Principal | ICD-10-CM | POA: Diagnosis present

## 2013-02-23 DIAGNOSIS — Z01812 Encounter for preprocedural laboratory examination: Secondary | ICD-10-CM

## 2013-02-23 HISTORY — PX: TOTAL HIP ARTHROPLASTY: SHX124

## 2013-02-23 LAB — TYPE AND SCREEN: Antibody Screen: NEGATIVE

## 2013-02-23 SURGERY — ARTHROPLASTY, HIP, TOTAL, ANTERIOR APPROACH
Anesthesia: General | Site: Hip | Laterality: Left | Wound class: Clean

## 2013-02-23 MED ORDER — ALUM & MAG HYDROXIDE-SIMETH 200-200-20 MG/5ML PO SUSP: 30.0000 mL | ORAL | Status: DC | PRN

## 2013-02-23 MED ORDER — MENTHOL 3 MG MT LOZG: 1.0000 | LOZENGE | OROMUCOSAL | Status: DC | PRN

## 2013-02-23 MED ORDER — LIDOCAINE HCL (CARDIAC) 20 MG/ML IV SOLN
INTRAVENOUS | Status: DC | PRN
  Administered 2013-02-23: 50 mg via INTRAVENOUS

## 2013-02-23 MED ORDER — DIPHENHYDRAMINE HCL 12.5 MG/5ML PO ELIX
25.0000 mg | ORAL_SOLUTION | Freq: Four times a day (QID) | ORAL | Status: DC | PRN
  Administered 2013-02-23 (×2): 25 mg via ORAL
  Filled 2013-02-23 (×2): qty 5
  Filled 2013-02-23: qty 10

## 2013-02-23 MED ORDER — ZOLPIDEM TARTRATE 5 MG PO TABS
5.0000 mg | ORAL_TABLET | Freq: Every evening | ORAL | Status: DC | PRN
  Administered 2013-02-23: 5 mg via ORAL
  Filled 2013-02-23: qty 1

## 2013-02-23 MED ORDER — TRANEXAMIC ACID 100 MG/ML IV SOLN
1000.0000 mg | Freq: Once | INTRAVENOUS | Status: AC
  Administered 2013-02-23: 1000 mg via INTRAVENOUS
  Filled 2013-02-23: qty 10

## 2013-02-23 MED ORDER — FERROUS SULFATE 325 (65 FE) MG PO TABS
325.0000 mg | ORAL_TABLET | Freq: Three times a day (TID) | ORAL | Status: DC
  Administered 2013-02-23 – 2013-02-24 (×2): 325 mg via ORAL
  Filled 2013-02-23 (×6): qty 1

## 2013-02-23 MED ORDER — HYDROMORPHONE HCL PF 1 MG/ML IJ SOLN
INTRAMUSCULAR | Status: AC
  Administered 2013-02-23: 1 mg via INTRAVENOUS
  Filled 2013-02-23: qty 1

## 2013-02-23 MED ORDER — METHOCARBAMOL 500 MG PO TABS
500.0000 mg | ORAL_TABLET | Freq: Four times a day (QID) | ORAL | Status: DC | PRN
  Administered 2013-02-23 – 2013-02-24 (×3): 500 mg via ORAL
  Filled 2013-02-23 (×3): qty 1

## 2013-02-23 MED ORDER — SENNA 8.6 MG PO TABS
1.0000 | ORAL_TABLET | Freq: Two times a day (BID) | ORAL | Status: DC
  Administered 2013-02-23 – 2013-02-24 (×2): 8.6 mg via ORAL
  Filled 2013-02-23 (×3): qty 1

## 2013-02-23 MED ORDER — DEXAMETHASONE SODIUM PHOSPHATE 10 MG/ML IJ SOLN
10.0000 mg | Freq: Once | INTRAMUSCULAR | Status: AC
  Administered 2013-02-24: 10 mg via INTRAVENOUS
  Filled 2013-02-23: qty 1

## 2013-02-23 MED ORDER — DEXAMETHASONE SODIUM PHOSPHATE 10 MG/ML IJ SOLN
10.0000 mg | Freq: Once | INTRAMUSCULAR | Status: AC
  Administered 2013-02-23: 10 mg via INTRAVENOUS

## 2013-02-23 MED ORDER — NICOTINE 21 MG/24HR TD PT24
21.0000 mg | MEDICATED_PATCH | Freq: Every day | TRANSDERMAL | Status: DC
  Administered 2013-02-23 – 2013-02-24 (×2): 21 mg via TRANSDERMAL
  Filled 2013-02-23 (×2): qty 1

## 2013-02-23 MED ORDER — PANTOPRAZOLE SODIUM 40 MG PO TBEC
40.0000 mg | DELAYED_RELEASE_TABLET | Freq: Every day | ORAL | Status: DC
  Administered 2013-02-23 – 2013-02-24 (×2): 40 mg via ORAL
  Filled 2013-02-23 (×3): qty 1

## 2013-02-23 MED ORDER — HYDROMORPHONE HCL PF 1 MG/ML IJ SOLN
0.5000 mg | INTRAMUSCULAR | Status: DC | PRN
  Administered 2013-02-23 – 2013-02-24 (×4): 1 mg via INTRAVENOUS
  Filled 2013-02-23 (×4): qty 1

## 2013-02-23 MED ORDER — STERILE WATER FOR IRRIGATION IR SOLN
Status: DC | PRN
  Administered 2013-02-23: 3000 mL

## 2013-02-23 MED ORDER — CEFAZOLIN SODIUM-DEXTROSE 2-3 GM-% IV SOLR
2.0000 g | Freq: Four times a day (QID) | INTRAVENOUS | Status: AC
  Administered 2013-02-23 (×2): 2 g via INTRAVENOUS
  Filled 2013-02-23 (×2): qty 50

## 2013-02-23 MED ORDER — DOCUSATE SODIUM 100 MG PO CAPS
100.0000 mg | ORAL_CAPSULE | Freq: Two times a day (BID) | ORAL | Status: DC
  Administered 2013-02-23 – 2013-02-24 (×2): 100 mg via ORAL

## 2013-02-23 MED ORDER — ONDANSETRON HCL 4 MG PO TABS: 4.0000 mg | ORAL_TABLET | Freq: Four times a day (QID) | ORAL | Status: DC | PRN

## 2013-02-23 MED ORDER — LACTATED RINGERS IV SOLN: INTRAVENOUS | Status: DC

## 2013-02-23 MED ORDER — 0.9 % SODIUM CHLORIDE (POUR BTL) OPTIME
TOPICAL | Status: DC | PRN
  Administered 2013-02-23: 1000 mL

## 2013-02-23 MED ORDER — LACTATED RINGERS IV SOLN
INTRAVENOUS | Status: DC | PRN
  Administered 2013-02-23 (×3): via INTRAVENOUS

## 2013-02-23 MED ORDER — HYDROMORPHONE HCL PF 1 MG/ML IJ SOLN
INTRAMUSCULAR | Status: DC | PRN
  Administered 2013-02-23: 0.5 mg via INTRAVENOUS
  Administered 2013-02-23: 1 mg via INTRAVENOUS
  Administered 2013-02-23 (×3): 0.5 mg via INTRAVENOUS
  Administered 2013-02-23: 1 mg via INTRAVENOUS

## 2013-02-23 MED ORDER — MIDAZOLAM HCL 5 MG/5ML IJ SOLN
INTRAMUSCULAR | Status: DC | PRN
  Administered 2013-02-23: 2 mg via INTRAVENOUS

## 2013-02-23 MED ORDER — HYDROCODONE-ACETAMINOPHEN 7.5-325 MG PO TABS
1.0000 | ORAL_TABLET | ORAL | Status: DC
  Administered 2013-02-23: 2 via ORAL
  Filled 2013-02-23: qty 2
  Filled 2013-02-23: qty 1

## 2013-02-23 MED ORDER — CEFAZOLIN SODIUM-DEXTROSE 2-3 GM-% IV SOLR
2.0000 g | INTRAVENOUS | Status: AC
  Administered 2013-02-23: 2 g via INTRAVENOUS

## 2013-02-23 MED ORDER — FENTANYL CITRATE 0.05 MG/ML IJ SOLN
INTRAMUSCULAR | Status: DC | PRN
  Administered 2013-02-23: 100 ug via INTRAVENOUS
  Administered 2013-02-23 (×5): 50 ug via INTRAVENOUS

## 2013-02-23 MED ORDER — PROMETHAZINE HCL 25 MG/ML IJ SOLN: 6.2500 mg | INTRAMUSCULAR | Status: DC | PRN

## 2013-02-23 MED ORDER — SODIUM CHLORIDE 0.9 % IV SOLN
INTRAVENOUS | Status: DC
  Administered 2013-02-23 – 2013-02-24 (×2): via INTRAVENOUS
  Filled 2013-02-23 (×4): qty 1000

## 2013-02-23 MED ORDER — ROCURONIUM BROMIDE 100 MG/10ML IV SOLN
INTRAVENOUS | Status: DC | PRN
  Administered 2013-02-23: 40 mg via INTRAVENOUS

## 2013-02-23 MED ORDER — ASPIRIN EC 325 MG PO TBEC
325.0000 mg | DELAYED_RELEASE_TABLET | Freq: Two times a day (BID) | ORAL | Status: DC
  Administered 2013-02-23 – 2013-02-24 (×2): 325 mg via ORAL
  Filled 2013-02-23 (×5): qty 1

## 2013-02-23 MED ORDER — PHENOL 1.4 % MT LIQD
1.0000 | OROMUCOSAL | Status: DC | PRN
  Administered 2013-02-23: 1 via OROMUCOSAL
  Filled 2013-02-23: qty 177

## 2013-02-23 MED ORDER — POLYETHYLENE GLYCOL 3350 17 G PO PACK: 17.0000 g | PACK | Freq: Every day | ORAL | Status: DC | PRN

## 2013-02-23 MED ORDER — DEXTROSE 5 % IV SOLN
500.0000 mg | Freq: Four times a day (QID) | INTRAVENOUS | Status: DC | PRN
  Administered 2013-02-23: 500 mg via INTRAVENOUS
  Filled 2013-02-23: qty 5

## 2013-02-23 MED ORDER — ONDANSETRON HCL 4 MG/2ML IJ SOLN: 4.0000 mg | Freq: Four times a day (QID) | INTRAMUSCULAR | Status: DC | PRN

## 2013-02-23 MED ORDER — CEFAZOLIN SODIUM-DEXTROSE 2-3 GM-% IV SOLR
INTRAVENOUS | Status: AC
  Filled 2013-02-23: qty 50

## 2013-02-23 MED ORDER — HYDROMORPHONE HCL PF 1 MG/ML IJ SOLN
0.2500 mg | INTRAMUSCULAR | Status: DC | PRN
  Administered 2013-02-23 (×3): 0.5 mg via INTRAVENOUS

## 2013-02-23 MED ORDER — OXYCODONE-ACETAMINOPHEN 5-325 MG PO TABS
1.0000 | ORAL_TABLET | ORAL | Status: DC
Start: 2013-02-23 — End: 2013-02-24
  Administered 2013-02-23 – 2013-02-24 (×3): 2 via ORAL
  Filled 2013-02-23 (×3): qty 2

## 2013-02-23 MED ORDER — PROPOFOL 10 MG/ML IV BOLUS
INTRAVENOUS | Status: DC | PRN
  Administered 2013-02-23: 200 mg via INTRAVENOUS

## 2013-02-23 SURGICAL SUPPLY — 40 items
ADH SKN CLS APL DERMABOND .7 (GAUZE/BANDAGES/DRESSINGS) ×1
BAG SPEC THK2 15X12 ZIP CLS (MISCELLANEOUS) ×2
BAG ZIPLOCK 12X15 (MISCELLANEOUS) ×4 IMPLANT
BLADE SAW SGTL 18X1.27X75 (BLADE) ×2 IMPLANT
CAPT HIP PF COP ×1 IMPLANT
CLOTH BEACON ORANGE TIMEOUT ST (SAFETY) ×2 IMPLANT
DERMABOND ADVANCED (GAUZE/BANDAGES/DRESSINGS) ×1
DERMABOND ADVANCED .7 DNX12 (GAUZE/BANDAGES/DRESSINGS) ×1 IMPLANT
DRAPE C-ARM 42X120 X-RAY (DRAPES) ×2 IMPLANT
DRAPE STERI IOBAN 125X83 (DRAPES) ×2 IMPLANT
DRAPE U-SHAPE 47X51 STRL (DRAPES) ×6 IMPLANT
DRSG AQUACEL AG ADV 3.5X10 (GAUZE/BANDAGES/DRESSINGS) ×2 IMPLANT
DRSG TEGADERM 4X4.75 (GAUZE/BANDAGES/DRESSINGS) IMPLANT
DURAPREP 26ML APPLICATOR (WOUND CARE) ×2 IMPLANT
ELECT BLADE TIP CTD 4 INCH (ELECTRODE) ×2 IMPLANT
ELECT REM PT RETURN 9FT ADLT (ELECTROSURGICAL) ×2
ELECTRODE REM PT RTRN 9FT ADLT (ELECTROSURGICAL) ×1 IMPLANT
EVACUATOR 1/8 PVC DRAIN (DRAIN) IMPLANT
FACESHIELD LNG OPTICON STERILE (SAFETY) ×8 IMPLANT
GAUZE SPONGE 2X2 8PLY STRL LF (GAUZE/BANDAGES/DRESSINGS) ×1 IMPLANT
GLOVE BIOGEL PI IND STRL 7.5 (GLOVE) ×1 IMPLANT
GLOVE BIOGEL PI IND STRL 8 (GLOVE) ×1 IMPLANT
GLOVE BIOGEL PI INDICATOR 7.5 (GLOVE) ×1
GLOVE BIOGEL PI INDICATOR 8 (GLOVE) ×1
GLOVE ECLIPSE 8.0 STRL XLNG CF (GLOVE) ×2 IMPLANT
GLOVE ORTHO TXT STRL SZ7.5 (GLOVE) ×4 IMPLANT
GOWN BRE IMP PREV XXLGXLNG (GOWN DISPOSABLE) ×2 IMPLANT
GOWN STRL NON-REIN LRG LVL3 (GOWN DISPOSABLE) ×2 IMPLANT
KIT BASIN OR (CUSTOM PROCEDURE TRAY) ×2 IMPLANT
PACK TOTAL JOINT (CUSTOM PROCEDURE TRAY) ×2 IMPLANT
PADDING CAST COTTON 6X4 STRL (CAST SUPPLIES) ×2 IMPLANT
SPONGE GAUZE 2X2 STER 10/PKG (GAUZE/BANDAGES/DRESSINGS) ×1
SUCTION FRAZIER 12FR DISP (SUCTIONS) ×2 IMPLANT
SUT MNCRL AB 4-0 PS2 18 (SUTURE) ×2 IMPLANT
SUT VIC AB 1 CT1 36 (SUTURE) ×8 IMPLANT
SUT VIC AB 2-0 CT1 27 (SUTURE) ×4
SUT VIC AB 2-0 CT1 TAPERPNT 27 (SUTURE) ×2 IMPLANT
SUT VLOC 180 0 24IN GS25 (SUTURE) ×2 IMPLANT
TOWEL OR 17X26 10 PK STRL BLUE (TOWEL DISPOSABLE) ×4 IMPLANT
TRAY FOLEY CATH 14FRSI W/METER (CATHETERS) ×2 IMPLANT

## 2013-02-23 NOTE — Progress Notes (Signed)
X-ray results noted 

## 2013-02-23 NOTE — Interval H&P Note (Signed)
History and Physical Interval Note:  02/23/2013 6:54 AM  Ryan [name]  has presented today for surgery, with the diagnosis of LEFT HIP OA  The various methods of treatment have been discussed with the patient and family. After consideration of risks, benefits and other options for treatment, the patient has consented to  Procedure(s): LEFT TOTAL HIP ARTHROPLASTY ANTERIOR APPROACH (Left) as a surgical intervention .  The patient's history has been reviewed, patient examined, no change in status, stable for surgery.  I have reviewed the patient's chart and labs.  Questions were answered to the patient's satisfaction.     Shelda Pal

## 2013-02-23 NOTE — Transfer of Care (Signed)
Immediate Anesthesia Transfer of Care Note  Patient: Ryan [name]  Procedure(s) Performed: Procedure(s) (LRB): LEFT TOTAL HIP ARTHROPLASTY ANTERIOR APPROACH (Left)  Patient Location: PACU  Anesthesia Type: General  Level of Consciousness: sedated, patient cooperative and responds to stimulaton  Airway & Oxygen Therapy: Patient Spontanous Breathing and Patient connected to face mask oxgen  Post-op Assessment: Report given to PACU RN and Post -op Vital signs reviewed and stable  Post vital signs: Reviewed and stable  Complications: No apparent anesthesia complications

## 2013-02-23 NOTE — Progress Notes (Signed)
Utilization review completed.  

## 2013-02-23 NOTE — Op Note (Signed)
NAME:  Ryan [name]                ACCOUNT NO.: [id number]      MEDICAL RECORD NO.: [id number]      FACILITY:  [facility]      PHYSICIAN:  Durene Romans D  DATE OF BIRTH:  06-28-1966     DATE OF PROCEDURE:  02/23/2013                                 OPERATIVE REPORT         PREOPERATIVE DIAGNOSIS: Left  hip osteoarthritis.      POSTOPERATIVE DIAGNOSIS:  Left hip osteoarthritis.      PROCEDURE:  Left total hip replacement through an anterior approach   utilizing DePuy THR system, component size 58mm Gription pinnacle cup, a size 36+4 neutral   Altrex liner, a size 6 high offset Tri Lock stem with a 36+1.5 delta ceramic   ball.      SURGEON:  Madlyn Frankel. Charlann Boxer, M.D.      ASSISTANT:  Leilani Able, PA-C     ANESTHESIA:  General.      SPECIMENS:  None.      COMPLICATIONS:  None.      BLOOD LOSS:  400 cc     DRAINS:  One Hemovac.      INDICATION OF THE PROCEDURE:  Ryan [name] is a 47 y.o. male who had   presented to office for evaluation of left hip pain.  Radiographs revealed   progressive degenerative changes with bone-on-bone   articulation to the  hip joint.  He has had a history of previous left hip surgery with resultant coxa breva and coxa vara.  He has developed a painful limited range of   motion significantly affecting his overall quality of life.  The patient was failing to    respond to conservative measures, and at this point was ready   to proceed with more definitive measures.  The patient has noted progressive   degenerative changes in his hip, progressive problems and dysfunction   with regarding the hip prior to surgery.  Consent was obtained for   benefit of pain relief.  Specific risk of infection, DVT, component   failure, dislocation, need for revision surgery, as well discussion of   the anterior versus posterior approach were reviewed.  Consent was   obtained for benefit of anterior pain relief through an anterior   approach.     PROCEDURE IN DETAIL:  The patient was brought to operative theater.   Once adequate anesthesia, preoperative antibiotics, 2gm Ancef administered.   The patient was positioned supine on the OSI Hanna table.  Once adequate   padding of boney process was carried out, we had predraped out the hip, and  used fluoroscopy to confirm orientation of the pelvis and position.      The left hip was then prepped and draped from proximal iliac crest to   mid thigh with shower curtain technique.      Time-out was performed identifying the patient, planned procedure, and   extremity.     An incision was then made 2 cm distal and lateral to the   anterior superior iliac spine extending over the orientation of the   tensor fascia lata muscle and sharp dissection was carried down to the   fascia of the muscle and protractor placed in the soft  tissues.      The fascia was then incised.  The muscle belly was identified and swept   laterally and retractor placed along the superior neck.  Following   cauterization of the circumflex vessels and removing some pericapsular   fat, a second cobra retractor was placed on the inferior neck.  A third   retractor was placed on the anterior acetabulum after elevating the   anterior rectus.  A L-capsulotomy was along the line of the   superior neck to the trochanteric fossa, then extended proximally and   distally.  Tag sutures were placed and the retractors were then placed   intracapsular.  We then identified the trochanteric fossa and   orientation of my neck cut, confirmed this radiographically   and then made a neck osteotomy with the femur on traction.  The femoral   head was removed without difficulty or complication.  Traction was let   off and retractors were placed posterior and anterior around the   acetabulum.      The labrum and foveal tissue were debrided.  I began reaming with a 45mm   reamer and reamed up to 57 reamer with good bony bed preparation  and a 58 Gription    cup was chosen based a very sclerotic acetabular bone stock and the fact that he already was partially protrused.  The final 58mm Gription Pinnacle cup was then impacted under fluoroscopy  to confirm the depth of penetration and orientation with respect to   abduction.  A screw was placed followed by the hole eliminator.  The final   36+4 neutral Altrex liner was impacted with good visualized rim fit.  The cup was positioned anatomically within the acetabular portion of the pelvis.      At this point, the femur was rolled at 80 degrees.  Further capsule was   released off the inferior aspect of the femoral neck.  I then   released the superior capsule proximally.  The hook was placed laterally   along the femur and elevated manually and held in position with the bed   hook.  The leg was then extended and adducted with the leg rolled to 100   degrees of external rotation.  Once the proximal femur was fully   exposed, I used a box osteotome to set orientation.  I then began   broaching with the starting chili pepper broach and passed this by hand and then broached up to 6.  With the 6 broach in place I chose a high offset neck and did a trial reduction.  The offset was appropriate, leg lengths   appeared to be equal, confirmed radiographically.   Given these findings, I went ahead and dislocated the hip, repositioned all   retractors and positioned the right hip in the extended and abducted position.  The final 6 high offset Tri Lock stem was   chosen and it was impacted down to the level of neck cut.  Based on this   and the trial reduction, a 36+1.5 delta ceramic ball was chosen and   impacted onto a clean and dry trunnion, and the hip was reduced.  The   hip had been irrigated throughout the case again at this point.  I did   reapproximate the superior capsular leaflet to the anterior leaflet   using #1 Vicryl, placed a medium Hemovac drain deep.  The fascia of the    tensor fascia lata muscle was then reapproximated using #1  Vicryl.  The   remaining wound was closed with 2-0 Vicryl and running 4-0 Monocryl.   The hip was cleaned, dried, and dressed sterilely using Dermabond and   Aquacel dressing.  Drain site dressed separately.  She was then brought   to recovery room in stable condition tolerating the procedure well.    Leilani Able, PA-C was present for the entirety of the case involved from   preoperative positioning, perioperative retractor management, general   facilitation of the case, as well as primary wound closure as assistant.            Madlyn Frankel Charlann Boxer, M.D.            MDO/MEDQ  D:  04/23/2011  T:  04/23/2011  Job:  [id number]      Electronically Signed by Durene Romans M.D. on 04/29/2011 09:15:38 AM

## 2013-02-23 NOTE — Anesthesia Postprocedure Evaluation (Signed)
  Anesthesia Post-op Note  Patient: Ryan [name]  Procedure(s) Performed: Procedure(s) (LRB): LEFT TOTAL HIP ARTHROPLASTY ANTERIOR APPROACH (Left)  Patient Location: PACU  Anesthesia Type: General  Level of Consciousness: awake and alert   Airway and Oxygen Therapy: Patient Spontanous Breathing  Post-op Pain: mild  Post-op Assessment: Post-op Vital signs reviewed, Patient's Cardiovascular Status Stable, Respiratory Function Stable, Patent Airway and No signs of Nausea or vomiting  Last Vitals:  Filed Vitals:   02/23/13 1430  BP: 111/70  Pulse: 72  Temp: 37 C  Resp: 18    Post-op Vital Signs: stable   Complications: No apparent anesthesia complications

## 2013-02-23 NOTE — Progress Notes (Signed)
Portable AP Pelvis and Lateral Left Hip X-rays done. 

## 2013-02-23 NOTE — Progress Notes (Signed)
[facility] is providing the following services: RW and Commode  If patient discharges after hours, please call [phone].   Ryan [name] 02/23/2013, 2:30 PM

## 2013-02-23 NOTE — Evaluation (Signed)
Physical Therapy Evaluation Patient Details Name: Ryan [name] MRN: [id number] DOB: 1965-08-20 Today's Date: 02/23/2013 Time: 4098-1191 PT Time Calculation (min): 37 min  PT Assessment / Plan / Recommendation History of Present Illness  L THA direct anterior approach with ongoin history of pain for 10 years.   Clinical Impression  Pt present with decreased mobility status after LTHA to benefit from PT to help improve safety and return home at [facility] level and PRN S/MinA level from family.     PT Assessment  Patient needs continued PT services    Follow Up Recommendations  No PT follow up (may transition to OP per MD recommendation when necessary)    Does the patient have the potential to tolerate intense rehabilitation      Barriers to Discharge        Equipment Recommendations  Rolling walker with 5" wheels    Recommendations for Other Services     Frequency 7X/week    Precautions / Restrictions Precautions Precautions: None (direct anteriro hip replacement) Restrictions Weight Bearing Restrictions: No (WBAT LLE)   Pertinent Vitals/Pain Pt had received pain meds prior to [place] working with him, however pt anticipated a lot of pain with initial movment wincing quit a bit , however once we started moving pt was much more relaxed and appeared in less pain. When asked to rate pain he would not give a number and stated it doesn't hurt that much.       Mobility  Bed Mobility Bed Mobility: Supine to Sit;Sit to Supine Supine to Sit: 4: Min assist;With rails;HOB elevated Sit to Supine: Not Tested (comment) Details for Bed Mobility Assistance: assistance needed for LLE movment and pulling UE to upright position Transfers Transfers: Sit to Stand;Stand to Sit Sit to Stand: 3: Mod assist;From elevated surface;With upper extremity assist;From bed Stand to Sit: 4: Min guard;With armrests;To chair/3-in-1;With upper extremity assist Details for Transfer Assistance: cues for safety adn  hand placement with RW Ambulation/Gait Ambulation/Gait Assistance: 4: Min guard Ambulation Distance (Feet): 45 Feet Assistive device: Rolling walker Ambulation/Gait Assistance Details: cues for RW safety adn step to pattern with use of RW Gait Pattern: Step-to pattern Gait velocity: slow    Exercises Total Joint Exercises Ankle Circles/Pumps: AROM;Left;Supine;10 reps Quad Sets: AROM;Left;Supine;5 reps Heel Slides: AAROM;Left;Supine;10 reps (slow due to pain with intial few)   PT Diagnosis: Difficulty walking;Generalized weakness  PT Problem List: Decreased strength;Decreased activity tolerance;Decreased balance;Decreased knowledge of use of DME;Decreased mobility PT Treatment Interventions: DME instruction;Gait training;Stair training;Functional mobility training;Therapeutic activities;Therapeutic exercise;Patient/family education     PT Goals(Current goals can be found in the care plan section) Acute Rehab PT Goals Patient Stated Goal: to return home as soon as possible PT Goal Formulation: With patient Time For Goal Achievement: 03/02/13 Potential to Achieve Goals: Good  Visit Information  Last PT Received On: 02/23/13 Assistance Needed: +1 History of Present Illness: L THA direct anterior approach with ongoin history of pain for 10 years.        Prior Functioning  Home Living Family/patient expects to be discharged to:: Private residence Living Arrangements: Spouse/significant other;Children Available Help at Discharge: Family;Available PRN/intermittently Home Access: Stairs to enter Entrance Stairs-Number of Steps: 3 Entrance Stairs-Rails: None (can reach railing on porch to assist and have done them 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 ) Patient left: in chair;with call bell/phone within reach;with family/visitor present Nurse Communication: Mobility status  GP     Marella Bile 02/23/2013, 6:17 PM Marella Bile, PT Pager: [phone] 02/23/2013

## 2013-02-24 ENCOUNTER — Encounter (HOSPITAL_COMMUNITY): Payer: Self-pay | Admitting: Orthopedic Surgery

## 2013-02-24 DIAGNOSIS — D500 Iron deficiency anemia secondary to blood loss (chronic): Secondary | ICD-10-CM | POA: Diagnosis not present

## 2013-02-24 DIAGNOSIS — E663 Overweight: Secondary | ICD-10-CM

## 2013-02-24 LAB — BASIC METABOLIC PANEL
CO2: 25 mEq/L (ref 19–32)
Calcium: 8.6 mg/dL (ref 8.4–10.5)
Creatinine, Ser: 0.82 mg/dL (ref 0.50–1.35)
GFR calc Af Amer: >90 mL/min (ref >90)
GFR calc non Af Amer: >90 mL/min (ref >90)
Sodium: 139 mEq/L (ref 135–145)

## 2013-02-24 LAB — CBC
MCH: 32.5 pg (ref 26.0–34.0)
MCHC: 34.1 g/dL (ref 30.0–36.0)
MCV: 95.2 fL (ref 78.0–100.0)
Platelets: 242 10*3/uL (ref 150–400)
RBC: 3.57 MIL/uL — ABNORMAL LOW (ref 4.22–5.81)
RDW: 13 % (ref 11.5–15.5)

## 2013-02-24 MED ORDER — POLYETHYLENE GLYCOL 3350 17 G PO PACK: 17.0000 g | PACK | Freq: Every day | ORAL | Status: DC | PRN

## 2013-02-24 MED ORDER — HYDROMORPHONE HCL 2 MG PO TABS
2.0000 mg | ORAL_TABLET | ORAL | Status: DC | PRN
  Administered 2013-02-24 (×2): 2 mg via ORAL
  Filled 2013-02-24 (×2): qty 1

## 2013-02-24 MED ORDER — DSS 100 MG PO CAPS: 100.0000 mg | ORAL_CAPSULE | Freq: Two times a day (BID) | ORAL | Status: DC

## 2013-02-24 MED ORDER — HYDROMORPHONE HCL 2 MG PO TABS: 2.0000 mg | ORAL_TABLET | ORAL | Status: DC | PRN

## 2013-02-24 MED ORDER — ASPIRIN 325 MG PO TBEC: 325.0000 mg | DELAYED_RELEASE_TABLET | Freq: Two times a day (BID) | ORAL | Status: AC

## 2013-02-24 MED ORDER — FERROUS SULFATE 325 (65 FE) MG PO TABS: 325.0000 mg | ORAL_TABLET | Freq: Three times a day (TID) | ORAL | Status: DC

## 2013-02-24 MED ORDER — METHOCARBAMOL 500 MG PO TABS: 500.0000 mg | ORAL_TABLET | Freq: Four times a day (QID) | ORAL | Status: DC | PRN

## 2013-02-24 NOTE — Care Management Note (Signed)
    Page 1 of 1   02/24/2013     1:30:18 PM   CARE MANAGEMENT NOTE 02/24/2013  Patient:  Ryan [name], Ryan [name]   Account Number:  [id number]  Date Initiated:  02/24/2013  Documentation initiated by:  Colleen Can  Subjective/Objective Assessment:   dx left hip replacement-anterior approach    [facility]ev[facility]lander will provide HHpt services with start date of tomorrow     Action/Plan:   Home with HHpt services   Anticipated DC Date:  02/24/2013   Anticipated DC Plan:  HOME W HOME HEALTH SERVICES      DC Planning Services  CM consult      [facility] Choice  HOME HEALTH   Choice offered to / List presented to:          [facility] arranged  HH-2 PT      [facility] agency  [facility]   Status of service:  Completed, signed off Medicare Important Message given?   (If response is "NO", the following Medicare IM given date fields will be blank) Date Medicare IM given:   Date Additional Medicare IM given:    Discharge Disposition:  HOME W HOME HEALTH SERVICES  Per UR Regulation:  Reviewed for med. necessity/level of care/duration of stay  If discussed at Long Length of Stay Meetings, dates discussed:    Comments:

## 2013-02-24 NOTE — Progress Notes (Signed)
   Subjective: 1 Day Post-Op Procedure(s) (LRB): LEFT TOTAL HIP ARTHROPLASTY ANTERIOR APPROACH (Left)   Patient reports pain as mild, pain well controlled. No events throughout the night. Ready to be discharge home.   Objective:   VITALS:   Filed Vitals:   02/24/13 0542  BP: 103/69  Pulse: 74  Temp: 98.3 F (36.8 C)  Resp: 16    Neurovascular intact Dorsiflexion/Plantar flexion intact Incision: dressing C/[name]/I No cellulitis present Compartment soft  LABS  Recent Labs  02/24/13 0500  HGB 11.6*  HCT 34.0*  WBC 15.0*  PLT 242     Recent Labs  02/24/13 0500  NA 139  K 4.0  BUN 10  CREATININE 0.82  GLUCOSE 139*     Assessment/Plan: 1 Day Post-Op Procedure(s) (LRB): LEFT TOTAL HIP ARTHROPLASTY ANTERIOR APPROACH (Left) Foley cath [name]/c'ed Advance diet Up with therapy [name]/C IV fluids Discharge home with home health Follow up in 2 weeks at [facility]. Follow up with OLIN,Ryan [name] in 2 weeks.  Contact information:  [facility] [address] [place] [address] [phone]    Expected ABLA Treated with iron and will observe  Overweight (BMI 25-29.9) Estimated body mass index is 28.12 kg/(m^2) as calculated from the following:   Height as of this encounter: 6\' 3"  (1.905 m).   Weight as of this encounter: 102.059 kg (225 lb). Patient also counseled that weight may inhibit the healing process Patient counseled that losing weight will help with future health issues     Ryan [name]. Ryan [name]   PAC  02/24/2013, 10:14 AM

## 2013-02-24 NOTE — Progress Notes (Signed)
Physical Therapy Treatment Patient Details Name: Ryan [name] MRN: [id number] DOB: Apr 27, 1966 Today's Date: 02/24/2013 Time: 4098-1191 PT Time Calculation (min): 28 min  PT Assessment / Plan / Recommendation  History of Present Illness L THA direct anterior approach with ongoing history of pain for 10 years.    PT Comments   Pt doing well; DME delivered; pt ready for D/C from our standpoint  Follow Up Recommendations  Home health PT     Does the patient have the potential to tolerate intense rehabilitation     Barriers to Discharge        Equipment Recommendations       Recommendations for Other Services    Frequency 7X/week   Progress towards PT Goals Progress towards PT goals: Progressing toward goals  Plan Current plan remains appropriate    Precautions / Restrictions Precautions Precautions: None Restrictions Weight Bearing Restrictions: No   Pertinent Vitals/Pain No c/o pain    Mobility  Bed Mobility Bed Mobility: Not assessed Transfers Transfers: Sit to Stand;Stand to Sit Sit to Stand: 5: Supervision;4: Min guard Stand to Sit: 5: Supervision;6: Modified independent (Device/Increase time) Ambulation/Gait Ambulation/Gait Assistance: 5: Supervision Ambulation Distance (Feet): 250 Feet Assistive device: Rolling walker Ambulation/Gait Assistance Details: occasional cues for RW safety Gait Pattern: Step-to pattern;Step-through pattern General Gait Details: pt concerned about LLE feeling longer;     Exercises Total Joint Exercises Ankle Circles/Pumps: AROM;Both;10 reps Quad Sets: AROM;10 reps;Left Heel Slides: AAROM;AROM;Left;10 reps Hip ABduction/ADduction: AROM;Left;10 reps;AAROM   PT Diagnosis:    PT Problem List:   PT Treatment Interventions:     PT Goals (current goals can now be found in the care plan section) Acute Rehab PT Goals Patient Stated Goal: to return home as soon as possible Time For Goal Achievement: 03/02/13 Potential to Achieve  Goals: Good  Visit Information  Last PT Received On: 02/24/13 Assistance Needed: +1 History of Present Illness: L THA direct anterior approach with ongoing history of pain for 10 years.     Subjective Data  Patient Stated Goal: to return home as soon as possible   Cognition  Cognition Arousal/Alertness: Awake/alert Behavior During Therapy: WFL for tasks assessed/performed Overall Cognitive Status: Within Functional Limits for tasks assessed    Balance     End of Session PT - End of Session Activity Tolerance: Patient tolerated treatment well Patient left: in chair;with call bell/phone within reach;with family/visitor present Nurse Communication: Mobility status   GP     [facility] 02/24/2013, 10:30 AM

## 2013-02-24 NOTE — Progress Notes (Signed)
Utilization review completed.  

## 2013-02-26 NOTE — Discharge Summary (Signed)
Physician Discharge Summary  Patient ID: Ryan [name] MRN: [id number] DOB/AGE: 1965-10-24 47 y.o.  Admit date: 02/23/2013 Discharge date: 02/24/2013   Procedures:  Procedure(s) (LRB): LEFT TOTAL HIP ARTHROPLASTY ANTERIOR APPROACH (Left)  Attending Physician:  Dr. Durene Romans   Admission Diagnoses:   Left hip OA / pain  Discharge Diagnoses:  Principal Problem:   S/P left THA, AA Active Problems:   Expected blood loss anemia   Overweight (BMI 25.0-29.9)  Past Medical History  Diagnosis Date  . Chest pain since 02-11-2013    pt thinks associated with reflux  . H/O hiatal hernia   . GERD (gastroesophageal reflux disease)   . Arthritis     oa    HPI: Ryan [name], 47 y.o. male, has a history of pain and functional disability in the left hip(s) due to arthritis and patient has failed non-surgical conservative treatments for greater than 12 weeks to include NSAID's and/or analgesics, corticosteriod injections, use of assistive devices and activity modification. Onset of symptoms was gradual starting >10 years ago with rapidlly worsening for the last 2 years. The patient noted prior procedures of the hip to include multiple procedures as a child on the left hip(s). Patient currently rates pain in the left hip at 10 out of 10 with activity. Patient has night pain, worsening of pain with activity and weight bearing, trendelenberg gait, pain that interfers with activities of daily living, pain with passive range of motion and crepitus. Patient has evidence of periarticular osteophytes and joint space narrowing by imaging studies. This condition presents safety issues increasing the risk of falls. There is no current active signs of infection. Risks, benefits and expectations were discussed with the patient. Patient understand the risks, benefits and expectations and wishes to proceed with surgery.  PCP: Alysia Penna, MD   Discharged Condition: good  Hospital Course:  Patient  underwent the above stated procedure on 02/23/2013. Patient tolerated the procedure well and brought to the recovery room in good condition and subsequently to the floor.  POD #1 BP: 103/69 ; Pulse: 74 ; Temp: 98.3 F (36.8 C) ; Resp: 16 Pt's foley was removed, as well as the hemovac drain removed. IV was changed to a saline lock. Patient reports pain as mild, pain well controlled. No events throughout the night. Ready to be discharge home.  Neurovascular intact, dorsiflexion/plantar flexion intact, incision: dressing C/D/I, no cellulitis present and compartment soft.   LABS  Basename    HGB  11.6  HCT  34.0    Discharge Exam: General appearance: alert, cooperative and no distress Extremities: Homans sign is negative, no sign of DVT, no edema, redness or tenderness in the calves or thighs and no ulcers, gangrene or trophic changes  Disposition:   Home-Health Care Svc with follow up in 2 weeks   Follow-up Information   Follow up with Shelda Pal, MD. Schedule an appointment as soon as possible for a visit in 2 weeks.   Specialty:  Orthopedic Surgery   Contact information:   [address] [place] [address] [phone]       Discharge Orders   Future Orders Complete By Expires   Call MD / Call 911  As directed    Comments:     If you experience chest pain or shortness of breath, CALL 911 and be transported to the hospital emergency room.  If you develope a fever above 101 F, pus (white drainage) or increased drainage or redness at the wound,  or calf pain, call your surgeon's office.   Change dressing  As directed    Comments:     Maintain surgical dressing for 10-14 days, then replace with 4x4 guaze and tape. Keep the area dry and clean.   Constipation Prevention  As directed    Comments:     Drink plenty of fluids.  Prune juice may be helpful.  You may use a stool softener, such as Colace (over the counter) 100 mg twice a day.  Use MiraLax (over the  counter) for constipation as needed.   Diet - low sodium heart healthy  As directed    Discharge instructions  As directed    Comments:     Maintain surgical dressing for 10-14 days, then replace with gauze and tape. Keep the area dry and clean until follow up. Follow up in 2 weeks at [facility]. Call with any questions or concerns.   Increase activity slowly as tolerated  As directed    TED hose  As directed    Comments:     Use stockings (TED hose) for 2 weeks on both leg(s).  You may remove them at night for sleeping.   Weight bearing as tolerated  As directed         Medication List         aspirin 325 MG EC tablet  Take 1 tablet (325 mg total) by mouth 2 (two) times daily.     DSS 100 MG Caps  Take 100 mg by mouth 2 (two) times daily.     ferrous sulfate 325 (65 FE) MG tablet  Take 1 tablet (325 mg total) by mouth 3 (three) times daily after meals.     HYDROmorphone 2 MG tablet  Commonly known as:  DILAUDID  Take 1-2 tablets (2-4 mg total) by mouth every 4 (four) hours as needed for pain.     methocarbamol 500 MG tablet  Commonly known as:  ROBAXIN  Take 1 tablet (500 mg total) by mouth every 6 (six) hours as needed (muscle spasms).     omeprazole 20 MG capsule  Commonly known as:  PRILOSEC  Take 20 mg by mouth as needed.     polyethylene glycol packet  Commonly known as:  MIRALAX / GLYCOLAX  Take 17 g by mouth daily as needed.         Signed: Anastasio Auerbach. [employer]   PAC  02/26/2013, 11:51 AM

## 2013-09-13 ENCOUNTER — Encounter: Payer: Self-pay | Admitting: Emergency Medicine

## 2013-09-13 ENCOUNTER — Ambulatory Visit (INDEPENDENT_AMBULATORY_CARE_PROVIDER_SITE_OTHER): Payer: BC Managed Care – PPO | Admitting: Emergency Medicine

## 2013-09-13 VITALS — BP 126/78 | HR 80 | Temp 98.2°F | Resp 16 | Ht 75.0 in | Wt 232.0 lb

## 2013-09-13 DIAGNOSIS — T1501XA Foreign body in cornea, right eye, initial encounter: Secondary | ICD-10-CM

## 2013-09-13 DIAGNOSIS — H5710 Ocular pain, unspecified eye: Secondary | ICD-10-CM

## 2013-09-13 DIAGNOSIS — T1500XA Foreign body in cornea, unspecified eye, initial encounter: Secondary | ICD-10-CM

## 2013-09-13 DIAGNOSIS — T1590XA Foreign body on external eye, part unspecified, unspecified eye, initial encounter: Secondary | ICD-10-CM

## 2013-09-13 MED ORDER — ACETAMINOPHEN-CODEINE #3 300-30 MG PO TABS: 1.0000 | ORAL_TABLET | ORAL | Status: DC | PRN

## 2013-09-13 MED ORDER — TOBRAMYCIN 0.3 % OP SOLN: 1.0000 [drp] | OPHTHALMIC | Status: DC

## 2013-09-13 NOTE — Progress Notes (Signed)
[facility] [address], [place] [address] [phone]  Date:  09/13/2013   Name:  Ryan [name]   DOB:  [birth year]   MRN:  [id number]  PCP:  Alysia PennaHOLWERDA, SCOTT, MD    Chief Complaint: No chief complaint on file.   History of Present Illness:  Ryan [name] is a 48 y.o. very pleasant male patient who presents with the following:  Cutting metal with a torch this morning and has pain in right eye.  No visual symptoms. No other complaints.  Denies other complaint or health concern today.   Patient Active Problem List   Diagnosis Date Noted  . Expected blood loss anemia 02/24/2013  . Overweight (BMI 25.0-29.9) 02/24/2013  . S/P left THA, AA 02/23/2013    Past Medical History  Diagnosis Date  . Chest pain since 02-11-2013    pt thinks associated with reflux  . H/O hiatal hernia   . GERD (gastroesophageal reflux disease)   . Arthritis     oa    Past Surgical History  Procedure Laterality Date  . Foot surgery      right  . Ankle surgery Right     reconstruction to muscle  . Knee surgery      left arthroscopy x 2  . Hip surgery  age 9    left, done x 2  . Urethra surgery   4669yrs ago    for scar tissue  . Total hip arthroplasty Left 02/23/2013    Procedure: LEFT TOTAL HIP ARTHROPLASTY ANTERIOR APPROACH;  Surgeon: Shelda PalMatthew D Olin, MD;  Location: [facility];  Service: Orthopedics;  Laterality: Left;    History  Substance Use Topics  . Smoking status: Current Every Day Smoker -- 1.50 packs/day for 25 years    Types: Cigarettes  . Smokeless tobacco: Never Used  . Alcohol Use: Yes     Comment: ocassionally    No family history on file.  No Known Allergies  Medication list has been reviewed and updated.  Current Outpatient Prescriptions on File Prior to Visit  Medication Sig Dispense Refill  . docusate sodium 100 MG CAPS Take 100 mg by mouth 2 (two) times daily.  10 capsule  0  . ferrous sulfate 325 (65 FE) MG tablet Take 1 tablet (325 mg total)  by mouth 3 (three) times daily after meals.    3  . HYDROmorphone (DILAUDID) 2 MG tablet Take 1-2 tablets (2-4 mg total) by mouth every 4 (four) hours as needed for pain.  80 tablet  0  . methocarbamol (ROBAXIN) 500 MG tablet Take 1 tablet (500 mg total) by mouth every 6 (six) hours as needed (muscle spasms).      Marland Kitchen. omeprazole (PRILOSEC) 20 MG capsule Take 20 mg by mouth as needed.      . polyethylene glycol (MIRALAX / GLYCOLAX) packet Take 17 g by mouth daily as needed.  14 each  0   No current facility-administered medications on file prior to visit.    Review of Systems:  As per HPI, otherwise negative.    Physical Examination: There were no vitals filed for this visit. There were no vitals filed for this visit. There is no weight on file to calculate BMI. Ideal Body Weight:     GEN: WDWN, NAD, Non-toxic, Alert & Oriented x 3 HEENT: Atraumatic, Normocephalic.  Ears and Nose: No external deformity. EXTR: No clubbing/cyanosis/edema NEURO: Normal gait.  PSYCH: Normally interactive. Conversant. Not depressed or anxious appearing.  Calm demeanor.  RIGHT EYE:  Metallic embedded FB right cornea at 7:00.  No hyphema.    Assessment and Plan: Eye pain Embedded FB cornea Removed with 25 ga needle atraumatically Follow up tomorrow if continued pain tobrex  Signed,  Phillips Odor, MD

## 2013-09-13 NOTE — Patient Instructions (Signed)
Corneal Abrasion  The cornea is the clear covering at the front and center of the eye. When looking at the colored portion of the eye (iris), you are looking through the cornea. This very thin tissue is made up of many layers. The surface layer is a single layer of cells (corneal epithelium) and is one of the most sensitive tissues in the body. If a scratch or injury causes the corneal epithelium to come off, it is called a corneal abrasion. If the injury extends to the tissues below the epithelium, the condition is called a corneal ulcer.  CAUSES    Scratches.   Trauma.   Foreign body in the eye.  Some people have recurrences of abrasions in the area of the original injury even after it has healed (recurrent erosion syndrome). Recurrent erosion syndrome generally improves and goes away with time.  SYMPTOMS    Eye pain.   Difficulty or inability to keep the injured eye open.   The eye becomes very sensitive to light.   Recurrent erosions tend to happen suddenly, first thing in the morning, usually after waking up and opening the eye.  DIAGNOSIS   Your health care provider can diagnose a corneal abrasion during an eye exam. Dye is usually placed in the eye using a drop or a small paper strip moistened by your tears. When the eye is examined with a special light, the abrasion shows up clearly because of the dye.  TREATMENT    Small abrasions may be treated with antibiotic drops or ointment alone.   Usually a pressure patch is specially applied. Pressure patches prevent the eye from blinking, allowing the corneal epithelium to heal. A pressure patch also reduces the amount of pain present in the eye during healing. Most corneal abrasions heal within 2 3 days with no effect on vision.  If the abrasion becomes infected and spreads to the deeper tissues of the cornea, a corneal ulcer can result. This is serious because it can cause corneal scarring. Corneal scars interfere with light passing through the cornea  and cause a loss of vision in the involved eye.  HOME CARE INSTRUCTIONS   Use medicine or ointment as directed. Only take over-the-counter or prescription medicines for pain, discomfort, or fever as directed by your health care provider.   Do not drive or operate machinery while your eye is patched. Your ability to judge distances is impaired.   If your health care provider has given you a follow-up appointment, it is very important to keep that appointment. Not keeping the appointment could result in a severe eye infection or permanent loss of vision. If there is any problem keeping the appointment, let your health care provider know.  SEEK MEDICAL CARE IF:    You have pain, light sensitivity, and a scratchy feeling in one eye or both eyes.   Your pressure patch keeps loosening up, and you can blink your eye under the patch after treatment.   Any kind of discharge develops from the eye after treatment or if the lids stick together in the morning.   You have the same symptoms in the morning as you did with the original abrasion days, weeks, or months after the abrasion healed.  MAKE SURE YOU:    Understand these instructions.   Will watch your condition.   Will get help right away if you are not doing well or get worse.  Document Released: 06/14/2000 Document Revised: 04/07/2013 Document Reviewed: 02/22/2013  ExitCare Patient Information   2014 [employer].

## 2013-11-16 ENCOUNTER — Emergency Department (HOSPITAL_COMMUNITY): Payer: BC Managed Care – PPO

## 2013-11-16 ENCOUNTER — Emergency Department (HOSPITAL_COMMUNITY)
Admission: EM | Admit: 2013-11-16 | Discharge: 2013-11-16 | Disposition: A | Discharge status: Home / Self Care | Payer: BC Managed Care – PPO | Attending: Emergency Medicine | Admitting: Emergency Medicine

## 2013-11-16 ENCOUNTER — Encounter (HOSPITAL_COMMUNITY): Payer: Self-pay | Admitting: Emergency Medicine

## 2013-11-16 DIAGNOSIS — Z79899 Other long term (current) drug therapy: Secondary | ICD-10-CM | POA: Insufficient documentation

## 2013-11-16 DIAGNOSIS — IMO0002 Reserved for concepts with insufficient information to code with codable children: Secondary | ICD-10-CM | POA: Insufficient documentation

## 2013-11-16 DIAGNOSIS — K219 Gastro-esophageal reflux disease without esophagitis: Secondary | ICD-10-CM | POA: Insufficient documentation

## 2013-11-16 DIAGNOSIS — F17200 Nicotine dependence, unspecified, uncomplicated: Secondary | ICD-10-CM | POA: Insufficient documentation

## 2013-11-16 DIAGNOSIS — Z792 Long term (current) use of antibiotics: Secondary | ICD-10-CM | POA: Insufficient documentation

## 2013-11-16 DIAGNOSIS — J029 Acute pharyngitis, unspecified: Secondary | ICD-10-CM

## 2013-11-16 DIAGNOSIS — R0789 Other chest pain: Secondary | ICD-10-CM | POA: Insufficient documentation

## 2013-11-16 DIAGNOSIS — M1990 Unspecified osteoarthritis, unspecified site: Secondary | ICD-10-CM | POA: Insufficient documentation

## 2013-11-16 LAB — COMPREHENSIVE METABOLIC PANEL
ALBUMIN: 3.7 g/dL (ref 3.5–5.2)
ALT: 20 U/L (ref 0–53)
AST: 13 U/L (ref 0–37)
Alkaline Phosphatase: 73 U/L (ref 39–117)
BUN: 14 mg/dL (ref 6–23)
CHLORIDE: 104 meq/L (ref 96–112)
CO2: 23 meq/L (ref 19–32)
Calcium: 9.3 mg/dL (ref 8.4–10.5)
Creatinine, Ser: 0.83 mg/dL (ref 0.50–1.35)
GFR calc Af Amer: >90 mL/min (ref >90)
Glucose, Bld: 121 mg/dL — ABNORMAL HIGH (ref 70–99)
Potassium: 4.2 mEq/L (ref 3.7–5.3)
SODIUM: 140 meq/L (ref 137–147)
Total Protein: 6.6 g/dL (ref 6.0–8.3)

## 2013-11-16 LAB — CBC WITH DIFFERENTIAL/PLATELET
BASOS ABS: 0 10*3/uL (ref 0.0–0.1)
BASOS PCT: 0 % (ref 0–1)
Eosinophils Absolute: 0.2 10*3/uL (ref 0.0–0.7)
Eosinophils Relative: 2 % (ref 0–5)
HCT: 41.2 % (ref 39.0–52.0)
Hemoglobin: 14.4 g/dL (ref 13.0–17.0)
LYMPHS PCT: 20 % (ref 12–46)
Lymphs Abs: 1.9 10*3/uL (ref 0.7–4.0)
MCH: 32.8 pg (ref 26.0–34.0)
MCHC: 35 g/dL (ref 30.0–36.0)
MCV: 93.8 fL (ref 78.0–100.0)
Monocytes Absolute: 1.5 10*3/uL — ABNORMAL HIGH (ref 0.1–1.0)
Monocytes Relative: 16 % — ABNORMAL HIGH (ref 3–12)
NEUTROS ABS: 5.8 10*3/uL (ref 1.7–7.7)
NEUTROS PCT: 62 % (ref 43–77)
PLATELETS: 262 10*3/uL (ref 150–400)
RBC: 4.39 MIL/uL (ref 4.22–5.81)
RDW: 12.3 % (ref 11.5–15.5)
WBC: 9.3 10*3/uL (ref 4.0–10.5)

## 2013-11-16 LAB — I-STAT TROPONIN, ED: TROPONIN I, POC: 0.01 ng/mL (ref 0.00–0.08)

## 2013-11-16 LAB — RAPID STREP SCREEN (MED CTR MEBANE ONLY): Streptococcus, Group A Screen (Direct): NEGATIVE

## 2013-11-16 LAB — LIPASE, BLOOD: Lipase: 43 U/L (ref 11–59)

## 2013-11-16 MED ORDER — ESOMEPRAZOLE MAGNESIUM 40 MG PO CPDR: 40.0000 mg | DELAYED_RELEASE_CAPSULE | Freq: Every day | ORAL | Status: DC

## 2013-11-16 MED ORDER — GI COCKTAIL ~~LOC~~
30.0000 mL | Freq: Once | ORAL | Status: AC
  Administered 2013-11-16: 30 mL via ORAL
  Filled 2013-11-16: qty 30

## 2013-11-16 NOTE — ED Provider Notes (Signed)
CSN: [id number]     Arrival date & time 11/16/13  0701 History   First MD Initiated Contact with Patient 11/16/13 [phone]     Chief Complaint  Patient presents with  . Chest Pain  . Sore Throat     (Consider location/radiation/quality/duration/timing/severity/associated sxs/prior Treatment) HPI Comments: Patient is a 48 year old male with no significant past medical history. He presents with complaints of burning in the chest that has been worsening over the past 2 weeks. States this is worse at night and frequently wakes him up. He feels as though he is being smothered. He is now reporting a burning in his mouth and sore throat. He denies fevers or chills. He has a history of a hiatal hernia however this feels different. There are no exertional symptoms.  Patient is a 48 y.o. male presenting with chest pain. The history is provided by the patient.  Chest Pain Pain location:  Substernal area Pain quality: burning   Pain radiates to:  Does not radiate Pain radiates to the back: no   Pain severity:  Moderate Onset quality:  Gradual Duration:  2 weeks Timing:  Constant Progression:  Worsening Chronicity:  New Relieved by:  Nothing Worsened by:  Nothing tried Ineffective treatments:  None tried Associated symptoms: no fever and no palpitations     Past Medical History  Diagnosis Date  . Chest pain since 02-11-2013    pt thinks associated with reflux  . H/O hiatal hernia   . GERD (gastroesophageal reflux disease)   . Arthritis     oa   Past Surgical History  Procedure Laterality Date  . Foot surgery      right  . Ankle surgery Right     reconstruction to muscle  . Knee surgery      left arthroscopy x 2  . Hip surgery  age 17    left, done x 2  . Urethra surgery   489yrs ago    for scar tissue  . Total hip arthroplasty Left 02/23/2013    Procedure: LEFT TOTAL HIP ARTHROPLASTY ANTERIOR APPROACH;  Surgeon: Shelda PalMatthew D Olin, MD;  Location: [facility];  Service: Orthopedics;  Laterality:  Left;   No family history on file. History  Substance Use Topics  . Smoking status: Current Every Day Smoker -- 1.50 packs/day for 25 years    Types: Cigarettes  . Smokeless tobacco: Never Used  . Alcohol Use: Yes     Comment: ocassionally    Review of Systems  Constitutional: Negative for fever.  Cardiovascular: Positive for chest pain. Negative for palpitations.  All other systems reviewed and are negative.     Allergies  Review of patient's allergies indicates no known allergies.  Home Medications   Prior to Admission medications   Medication Sig Start Date End Date Taking? Authorizing Provider  acetaminophen-codeine (TYLENOL #3) 300-30 MG per tablet Take 1-2 tablets by mouth every 4 (four) hours as needed. 09/13/13   Phillips OdorJeffery Anderson, MD  docusate sodium 100 MG CAPS Take 100 mg by mouth 2 (two) times daily. 02/24/13   Genelle GatherMatthew Scott Babish, PA-C  ferrous sulfate 325 (65 FE) MG tablet Take 1 tablet (325 mg total) by mouth 3 (three) times daily after meals. 02/24/13   Genelle GatherMatthew Scott Babish, PA-C  HYDROmorphone (DILAUDID) 2 MG tablet Take 1-2 tablets (2-4 mg total) by mouth every 4 (four) hours as needed for pain. 02/24/13   Genelle GatherMatthew Scott Babish, PA-C  methocarbamol (ROBAXIN) 500 MG tablet Take 1 tablet (500 mg total)  by mouth every 6 (six) hours as needed (muscle spasms). 02/24/13   Genelle GatherMatthew Scott Babish, PA-C  omeprazole (PRILOSEC) 20 MG capsule Take 20 mg by mouth as needed.    Historical Provider, MD  polyethylene glycol (MIRALAX / GLYCOLAX) packet Take 17 g by mouth daily as needed. 02/24/13   Genelle GatherMatthew Scott Babish, PA-C  predniSONE (DELTASONE) 10 MG tablet Take 10 mg by mouth daily with breakfast.    Historical Provider, MD  tobramycin (TOBREX) 0.3 % ophthalmic solution Place 1 drop into the right eye every 4 (four) hours. 09/13/13   Phillips OdorJeffery Anderson, MD   BP 117/68  Temp(Src) 97.6 F (36.4 C) (Oral)  Resp 19  Ht 6\' 3"  (1.905 m)  Wt 228 lb (103.42 kg)  BMI 28.50 kg/m2  SpO2  99% Physical Exam  Nursing note and vitals reviewed. Constitutional: He is oriented to person, place, and time. He appears well-developed and well-nourished. No distress.  HENT:  Head: Normocephalic and atraumatic.  There is mild erythema of the posterior oropharynx without exudates.  Neck: Normal range of motion. Neck supple.  Cardiovascular: Normal rate, regular rhythm and normal heart sounds.   No murmur heard. Pulmonary/Chest: Effort normal and breath sounds normal. No respiratory distress. He has no wheezes.  Abdominal: Soft. Bowel sounds are normal.  Musculoskeletal: Normal range of motion. He exhibits no edema.  Lymphadenopathy:    He has no cervical adenopathy.  Neurological: He is alert and oriented to person, place, and time.  Skin: Skin is warm and dry. He is not diaphoretic.    ED Course  Procedures (including critical care time) Labs Review Labs Reviewed  RAPID STREP SCREEN  CBC WITH DIFFERENTIAL  COMPREHENSIVE METABOLIC PANEL  LIPASE, BLOOD  I-STAT TROPOININ, ED    Imaging Review No results found.   EKG Interpretation   Date/Time:  Tuesday Nov 16 2013 07:07:27 EDT Ventricular Rate:  62 PR Interval:  165 QRS Duration: 87 QT Interval:  380 QTC Calculation: 386 R Axis:   67 Text Interpretation:  Sinus rhythm ST elev, probable normal early repol  pattern No significant change since 02/17/13 Confirmed by DELOS  MD,  Lavar Rosenzweig ([id number]) on 11/16/2013 8:32:18 AM      MDM   Final diagnoses:  None    Patient is a 48 year old male who presents with complaints of intermittent chest pains for 2 weeks. He is now having burning in his throat and chest. Workup reveals no evidence for pneumonia, no evidence for a cardiac etiology, and negative strep test. He states his symptoms are worse at night and I believe this is related to acid reflux. He takes Prilosec intermittently however this does not seem to be helping. My recommendations are to start Nexium and followup  with gastroenterology if not improving in the next week. I see no other emergent pathology. Patient understands to return if his symptoms worsen or change.    Geoffery Lyonsouglas Aiyonna Lucado, MD 11/16/13 [phone]

## 2013-11-16 NOTE — ED Notes (Signed)
Pt states that he has been having intermittent chest pain x 2 weeks; pt states that the pain has been progressive and that it wakes him up sometimes at night; pt states that he feels like he has "cold in his chest" and also c/o sharp stabbing sensation that wakes him up at night with shortness of breath; pt states that he has to get up and lean over in order to regain his breath when these episodes happen; pt states that he also began to have a sore throat this am; pt also c/o abd pain with tenderness to umbilical area.

## 2013-11-16 NOTE — Discharge Instructions (Signed)
Nexium as prescribed.  Followup with gastroenterology if not improving in the next several days. The contact information has been provided in this discharge summary.  Return to the emergency department if you experience significant worsening of your symptoms, difficulty breathing or swallowing, or any other new and concerning symptoms.   Pharyngitis Pharyngitis is redness, pain, and swelling (inflammation) of your pharynx.  CAUSES  Pharyngitis is usually caused by infection. Most of the time, these infections are from viruses (viral) and are part of a cold. However, sometimes pharyngitis is caused by bacteria (bacterial). Pharyngitis can also be caused by allergies. Viral pharyngitis may be spread from person to person by coughing, sneezing, and personal items or utensils (cups, forks, spoons, toothbrushes). Bacterial pharyngitis may be spread from person to person by more intimate contact, such as kissing.  SIGNS AND SYMPTOMS  Symptoms of pharyngitis include:   Sore throat.   Tiredness (fatigue).   Low-grade fever.   Headache.  Joint pain and muscle aches.  Skin rashes.  Swollen lymph nodes.  Plaque-like film on throat or tonsils (often seen with bacterial pharyngitis). DIAGNOSIS  Your health care provider will ask you questions about your illness and your symptoms. Your medical history, along with a physical exam, is often all that is needed to diagnose pharyngitis. Sometimes, a rapid strep test is done. Other lab tests may also be done, depending on the suspected cause.  TREATMENT  Viral pharyngitis will usually get better in 3 4 days without the use of medicine. Bacterial pharyngitis is treated with medicines that kill germs (antibiotics).  HOME CARE INSTRUCTIONS   Drink enough water and fluids to keep your urine clear or pale yellow.   Only take over-the-counter or prescription medicines as directed by your health care provider:   If you are prescribed antibiotics,  make sure you finish them even if you start to feel better.   Do not take aspirin.   Get lots of rest.   Gargle with 8 oz of salt water ( tsp of salt per 1 qt of water) as often as every 1 2 hours to soothe your throat.   Throat lozenges (if you are not at risk for choking) or sprays may be used to soothe your throat. SEEK MEDICAL CARE IF:   You have large, tender lumps in your neck.  You have a rash.  You cough up green, yellow-brown, or bloody spit. SEEK IMMEDIATE MEDICAL CARE IF:   Your neck becomes stiff.  You drool or are unable to swallow liquids.  You vomit or are unable to keep medicines or liquids down.  You have severe pain that does not go away with the use of recommended medicines.  You have trouble breathing (not caused by a stuffy nose). MAKE SURE YOU:   Understand these instructions.  Will watch your condition.  Will get help right away if you are not doing well or get worse. Document Released: 06/17/2005 Document Revised: 04/07/2013 Document Reviewed: 02/22/2013 [facility] Patient Information 2014 [employer], [place].  Chest Pain (Nonspecific) It is often hard to give a specific diagnosis for the cause of chest pain. There is always a chance that your pain could be related to something serious, such as a heart attack or a blood clot in the lungs. You need to follow up with your caregiver for further evaluation. CAUSES   Heartburn.  Pneumonia or bronchitis.  Anxiety or stress.  Inflammation around your heart (pericarditis) or lung (pleuritis or pleurisy).  A blood clot in the  lung.  A collapsed lung (pneumothorax). It can develop suddenly on its own (spontaneous pneumothorax) or from injury (trauma) to the chest.  Shingles infection (herpes zoster virus). The chest wall is composed of bones, muscles, and cartilage. Any of these can be the source of the pain.  The bones can be bruised by injury.  The muscles or cartilage can be strained by  coughing or overwork.  The cartilage can be affected by inflammation and become sore (costochondritis). DIAGNOSIS  Lab tests or other studies, such as X-rays, electrocardiography, stress testing, or cardiac imaging, may be needed to find the cause of your pain.  TREATMENT   Treatment depends on what may be causing your chest pain. Treatment may include:  Acid blockers for heartburn.  Anti-inflammatory medicine.  Pain medicine for inflammatory conditions.  Antibiotics if an infection is present.  You may be advised to change lifestyle habits. This includes stopping smoking and avoiding alcohol, caffeine, and chocolate.  You may be advised to keep your head raised (elevated) when sleeping. This reduces the chance of acid going backward from your stomach into your esophagus.  Most of the time, nonspecific chest pain will improve within 2 to 3 days with rest and mild pain medicine. HOME CARE INSTRUCTIONS   If antibiotics were prescribed, take your antibiotics as directed. Finish them even if you start to feel better.  For the next few days, avoid physical activities that bring on chest pain. Continue physical activities as directed.  Do not smoke.  Avoid drinking alcohol.  Only take over-the-counter or prescription medicine for pain, discomfort, or fever as directed by your caregiver.  Follow your caregiver's suggestions for further testing if your chest pain does not go away.  Keep any follow-up appointments you made. If you do not go to an appointment, you could develop lasting (chronic) problems with pain. If there is any problem keeping an appointment, you must call to reschedule. SEEK MEDICAL CARE IF:   You think you are having problems from the medicine you are taking. Read your medicine instructions carefully.  Your chest pain does not go away, even after treatment.  You develop a rash with blisters on your chest. SEEK IMMEDIATE MEDICAL CARE IF:   You have increased  chest pain or pain that spreads to your arm, neck, jaw, back, or abdomen.  You develop shortness of breath, an increasing cough, or you are coughing up blood.  You have severe back or abdominal pain, feel nauseous, or vomit.  You develop severe weakness, fainting, or chills.  You have a fever. THIS IS AN EMERGENCY. Do not wait to see if the pain will go away. Get medical help at once. Call your local emergency services (911 in [place] Do not drive yourself to the hospital. MAKE SURE YOU:   Understand these instructions.  Will watch your condition.  Will get help right away if you are not doing well or get worse. Document Released: 03/27/2005 Document Revised: 09/09/2011 Document Reviewed: 01/21/2008 [facility] Patient Information 2014 [place], [place].

## 2013-11-18 LAB — CULTURE, GROUP A STREP

## 2014-04-15 ENCOUNTER — Other Ambulatory Visit: Payer: Self-pay | Admitting: Internal Medicine

## 2014-04-15 DIAGNOSIS — R0789 Other chest pain: Secondary | ICD-10-CM

## 2014-04-18 ENCOUNTER — Ambulatory Visit
Admission: RE | Admit: 2014-04-18 | Discharge: 2014-04-18 | Disposition: A | Discharge status: Home / Self Care | Payer: BC Managed Care – PPO | Source: Ambulatory Visit | Attending: Internal Medicine | Admitting: Internal Medicine

## 2014-04-18 DIAGNOSIS — R0789 Other chest pain: Secondary | ICD-10-CM

## 2014-04-18 MED ORDER — IOHEXOL 350 MG/ML SOLN
125.0000 mL | Freq: Once | INTRAVENOUS | Status: AC | PRN
  Administered 2014-04-18: 125 mL via INTRAVENOUS

## 2014-05-13 ENCOUNTER — Ambulatory Visit: Payer: BC Managed Care – PPO | Admitting: Cardiovascular Disease

## 2014-06-01 ENCOUNTER — Encounter: Payer: Self-pay | Admitting: Cardiovascular Disease

## 2014-06-15 ENCOUNTER — Telehealth: Payer: Self-pay | Admitting: Cardiovascular Disease

## 2014-06-15 NOTE — Telephone Encounter (Signed)
[facility] appointment with Dr. Elease HashimotoNahser on 12.17.15 at 9:45 a.m: Given to the chart prep team on 12.16.15:djc

## 2014-06-16 ENCOUNTER — Ambulatory Visit: Payer: BC Managed Care – PPO | Admitting: Cardiovascular Disease

## 2014-06-29 ENCOUNTER — Encounter: Payer: Self-pay | Admitting: Cardiovascular Disease

## 2014-08-11 ENCOUNTER — Encounter: Payer: Self-pay | Admitting: Physician Assistant

## 2014-08-11 ENCOUNTER — Ambulatory Visit (INDEPENDENT_AMBULATORY_CARE_PROVIDER_SITE_OTHER): Payer: Self-pay | Admitting: Family Medicine

## 2014-08-11 VITALS — BP 140/86 | HR 80 | Temp 98.2°F | Resp 16

## 2014-08-11 DIAGNOSIS — T1501XA Foreign body in cornea, right eye, initial encounter: Secondary | ICD-10-CM

## 2014-08-11 MED ORDER — POLYMYXIN B-TRIMETHOPRIM 10000-0.1 UNIT/ML-% OP SOLN: 1.0000 [drp] | Freq: Four times a day (QID) | OPHTHALMIC | Status: DC

## 2014-08-11 MED ORDER — CIPROFLOXACIN HCL 0.3 % OP SOLN: 1.0000 [drp] | OPHTHALMIC | Status: DC

## 2014-08-11 NOTE — Progress Notes (Signed)
   Subjective:    Patient ID: Particia Latherroy S [name], male    DOB: Mar 09, 1966, 49 y.o.   MRN: [id number]  No chief complaint on file.  Medications, allergies, past medical history, surgical history, family history, social history and problem list reviewed and updated.  HPI  [age] yom presents with foreign body sensation right eye.   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 Did not notice any loss of vision. The next morning he awoke and pain/irritation right eye was still there. Went to work today but eye continued to bother him so presented here.   Has had moderate amnt photophobia past day.   Review of Systems No double vision, loss of vision, blurry vision, ha.     Objective:   Physical Exam  Constitutional: He is oriented to person, place, and time. He appears well-developed and well-nourished.  Non-toxic appearance. He does not have a sickly appearance. He does not appear ill. No distress.  BP 140/86 mmHg  Pulse 80  Temp(Src) 98.2 F (36.8 C) (Oral)  Resp 16  SpO2 96%   Eyes: EOM are normal. Pupils are equal, round, and reactive to light. Lids are everted and swept, no foreign bodies found.  Slit lamp exam:      The right eye shows corneal abrasion, foreign body and fluorescein uptake. The right eye shows no anterior chamber bulge.  Microscopic piece foreign body material just medial to pupil. Slight stromal reaction around foreign body. 2mm linear line increased uptake distal to pupil. No rust ring after removal.   Neurological: He is alert and oriented to person, place, and time.  Psychiatric: He has a normal mood and affect. His speech is normal and behavior is normal.   Pre-exam vision screen:  Visual Acuity Screening   Right eye Left eye Both eyes  Without correction: 20/40 20/15-2 20/15-2  With correction:      Foreign body removed with gentle swiping motion with no complication.      Assessment & Plan:   [age] yom presents with foreign  body sensation right eye.   Foreign body of cornea, right, initial encounter - Plan: trimethoprim-polymyxin b (POLYTRIM) ophthalmic solution, Ambulatory referral to Ophthalmology --foreign body removed --slight stromal reaction around foreign body after removal, no rust ring --decreased visual acuity right eye prior to removal --abx drops (polytrim dc, changed to cipro drops) --plan to refer to Dr Lucious [place]' tomorrow am to see pt tomorrow  Ryan [name] M. Alizah Sills, PA-C Physician Assistant-Certified [facility]  08/11/2014 7:32 PM

## 2014-08-11 NOTE — Patient Instructions (Signed)
Please use the eyedrops 4 times per day.  Dr Lucious [place] office will be in contact with you tomorrow to see you to ensure everything looks ok after today. Please let us know if they do not get in touch with you.

## 2014-08-12 NOTE — Progress Notes (Signed)
Patient discussed and examined with Mr. Agustin CreeMcVeigh. Procedure reviewed and discussed with pt with consent obtained.  No complications with removal of likley metallic FB, and although stromal reaction noted, I did not see apparent rust ring, but optho to see next day for closer look and with decreased visual acuity.  Agree with assessment and plan of care per note by Mr. McVeigh.

## 2015-02-07 ENCOUNTER — Encounter (HOSPITAL_COMMUNITY): Payer: Self-pay | Admitting: Emergency Medicine

## 2015-02-07 ENCOUNTER — Emergency Department (HOSPITAL_COMMUNITY): Payer: 59

## 2015-02-07 ENCOUNTER — Emergency Department (HOSPITAL_COMMUNITY)
Admission: EM | Admit: 2015-02-07 | Discharge: 2015-02-07 | Disposition: A | Discharge status: Home / Self Care | Payer: 59 | Attending: Emergency Medicine | Admitting: Emergency Medicine

## 2015-02-07 DIAGNOSIS — R079 Chest pain, unspecified: Secondary | ICD-10-CM | POA: Diagnosis present

## 2015-02-07 DIAGNOSIS — R042 Hemoptysis: Secondary | ICD-10-CM | POA: Diagnosis not present

## 2015-02-07 DIAGNOSIS — K219 Gastro-esophageal reflux disease without esophagitis: Secondary | ICD-10-CM | POA: Insufficient documentation

## 2015-02-07 DIAGNOSIS — Z8739 Personal history of other diseases of the musculoskeletal system and connective tissue: Secondary | ICD-10-CM | POA: Insufficient documentation

## 2015-02-07 DIAGNOSIS — Z720 Tobacco use: Secondary | ICD-10-CM | POA: Diagnosis not present

## 2015-02-07 DIAGNOSIS — R0602 Shortness of breath: Secondary | ICD-10-CM | POA: Diagnosis not present

## 2015-02-07 LAB — BASIC METABOLIC PANEL
ANION GAP: 9 (ref 5–15)
BUN: 14 mg/dL (ref 6–20)
CALCIUM: 9.5 mg/dL (ref 8.9–10.3)
CHLORIDE: 108 mmol/L (ref 101–111)
CO2: 22 mmol/L (ref 22–32)
Creatinine, Ser: 0.92 mg/dL (ref 0.61–1.24)
GFR calc non Af Amer: >60 mL/min (ref >60)
Glucose, Bld: 117 mg/dL — ABNORMAL HIGH (ref 65–99)
Potassium: 4.3 mmol/L (ref 3.5–5.1)
SODIUM: 139 mmol/L (ref 135–145)

## 2015-02-07 LAB — CBC
HEMATOCRIT: 41.1 % (ref 39.0–52.0)
Hemoglobin: 14.1 g/dL (ref 13.0–17.0)
MCH: 32.2 pg (ref 26.0–34.0)
MCHC: 34.3 g/dL (ref 30.0–36.0)
MCV: 93.8 fL (ref 78.0–100.0)
PLATELETS: 238 10*3/uL (ref 150–400)
RBC: 4.38 MIL/uL (ref 4.22–5.81)
RDW: 12.5 % (ref 11.5–15.5)
WBC: 6.4 10*3/uL (ref 4.0–10.5)

## 2015-02-07 LAB — I-STAT TROPONIN, ED: TROPONIN I, POC: 0.02 ng/mL (ref 0.00–0.08)

## 2015-02-07 MED ORDER — ALBUTEROL SULFATE HFA 108 (90 BASE) MCG/ACT IN AERS
2.0000 | INHALATION_SPRAY | Freq: Once | RESPIRATORY_TRACT | Status: AC
  Administered 2015-02-07: 2 via RESPIRATORY_TRACT
  Filled 2015-02-07: qty 6.7

## 2015-02-07 MED ORDER — OMEPRAZOLE 20 MG PO CPDR: 20.0000 mg | DELAYED_RELEASE_CAPSULE | Freq: Two times a day (BID) | ORAL | Status: DC

## 2015-02-07 NOTE — ED Notes (Signed)
Pt long HX of chest pain. Seen by PCP for presenting complaint however today worse. Epigastric pain only. Increased with eating. Increased nausea. States depression with chronic illness

## 2015-02-07 NOTE — ED Notes (Signed)
Pt c/o chest pain, SOB, hemoptysis, and hoarseness of voice onset this morning.

## 2015-02-07 NOTE — ED Notes (Signed)
Went to collect blood samples,RN was in the process of collecting them

## 2015-02-07 NOTE — ED Provider Notes (Signed)
CSN: [id number]     Arrival date & time 02/07/15  0822 History   First MD Initiated Contact with Patient 02/07/15 [phone]     Chief Complaint  Patient presents with  . Chest Pain  . Hemoptysis     (Consider location/radiation/quality/duration/timing/severity/associated sxs/prior Treatment) HPI Comments: 49yo M w/ PMH including GERD who p/w chest pain and cough. Patient states that he has a two-year history of daily, constant chest pain that is central, worse in the morning, and improves throughout the day. He has been seen by his PCP several times and he is currently on a PPI. He states that he occasionally feels short of breath and this is chronic. He stopped smoking 2 weeks ago and has had a dry cough. The cough has been worsening despite stopping smoking. The cough has been productive of clear phlegm but it was pink tinged at this morning. No large volume hemoptysis. He states that he woke up with hoarseness today but he denies any sore throat or other upper respiratory infection symptoms. No fevers or weight loss. He is frustrated because he continues to have this chest pain and has reflux symptoms immediately after he eats despite taking his medications. He was on Nexium but it became very expensive so he switched to twice daily over-the-counter Prilosec.   Patient is a 49 y.o. male presenting with chest pain. The history is provided by the patient.  Chest Pain   Past Medical History  Diagnosis Date  . Chest pain since 02-11-2013    pt thinks associated with reflux  . H/O hiatal hernia   . GERD (gastroesophageal reflux disease)   . Arthritis     oa   Past Surgical History  Procedure Laterality Date  . Foot surgery      right  . Ankle surgery Right     reconstruction to muscle  . Knee surgery      left arthroscopy x 2  . Hip surgery  age 8    left, done x 2  . Urethra surgery   53yrs ago    for scar tissue  . Total hip arthroplasty Left 02/23/2013    Procedure: LEFT TOTAL HIP  ARTHROPLASTY ANTERIOR APPROACH;  Surgeon: Shelda Pal, MD;  Location: [facility];  Service: Orthopedics;  Laterality: Left;   No family history on file. History  Substance Use Topics  . Smoking status: Current Every Day Smoker -- 1.50 packs/day for 25 years    Types: Cigarettes  . Smokeless tobacco: Never Used  . Alcohol Use: Yes     Comment: ocassionally    Review of Systems  Cardiovascular: Positive for chest pain.    10 Systems reviewed and are negative for acute change except as noted in the HPI.   Allergies  Review of patient's allergies indicates no known allergies.  Home Medications   Prior to Admission medications   Medication Sig Start Date End Date Taking? Authorizing Provider  ciprofloxacin (CILOXAN) 0.3 % ophthalmic solution Place 1 drop into the right eye every 4 (four) hours while awake. Patient not taking: Reported on 02/07/2015 08/11/14   Raelyn Ensign, PA  omeprazole (PRILOSEC) 20 MG capsule Take 1 capsule (20 mg total) by mouth 2 (two) times daily before a meal. 02/07/15   Laurence Spates, MD   BP 122/69 mmHg  Pulse 73  Resp 18  Ht  (1.88 m)  Wt 230 lb (104.327 kg)  BMI 29.52 kg/m2  SpO2 100% Physical Exam  Constitutional: He is  oriented to person, place, and time. He appears well-developed and well-nourished.  Tearful but NAD  HENT:  Head: Normocephalic and atraumatic.  Moist mucous membranes  Eyes: Conjunctivae are normal. Pupils are equal, round, and reactive to light.  Neck: Neck supple.  Cardiovascular: Normal rate, regular rhythm and normal heart sounds.   No murmur heard. Pulmonary/Chest: Effort normal.  Occasional faint crackles but normal WOB, no wheezes  Abdominal: Soft. Bowel sounds are normal. He exhibits no distension. There is no tenderness.  Musculoskeletal: He exhibits no edema.  Neurological: He is alert and oriented to person, place, and time.  Fluent speech  Skin: Skin is warm and dry.  Psychiatric: Judgment normal.   Tearful, frustrated but calm  Nursing note and vitals reviewed.   ED Course  Procedures (including critical care time) Labs Review Labs Reviewed  BASIC METABOLIC PANEL - Abnormal; Notable for the following:    Glucose, Bld 117 (*)    All other components within normal limits  CBC  I-STAT TROPOININ, ED    Imaging Review Dg Chest 2 View  02/07/2015   CLINICAL DATA:  49 year old male with mid chest pain and hemoptysis this morning. Recently stopped smoking. Initial encounter.  EXAM: CHEST  2 VIEW  COMPARISON:  Chest CTA 04/18/2014 and earlier.  FINDINGS: Lung volumes remain normal. Normal cardiac size and mediastinal contours. No pneumothorax, pulmonary edema, pleural effusion or confluent pulmonary opacity. No acute osseous abnormality identified. Visualized tracheal air column is within normal limits.  IMPRESSION: Negative, no acute cardiopulmonary abnormality.   Electronically Signed   By: Odessa Fleming M.D.   On: 02/07/2015 08:50     EKG Interpretation   Date/Time:  Tuesday February 07 2015 08:34:02 EDT Ventricular Rate:  67 PR Interval:  161 QRS Duration: 81 QT Interval:  374 QTC Calculation: 395 R Axis:   74 Text Interpretation:  Sinus rhythm Baseline wander in lead(s) III aVL aVF  No significant change since last tracing Confirmed by Crystale Giannattasio MD, Vishwa Dais  ([id number]) on 02/07/2015 9:30:25 AM      MDM   Final diagnoses:  Gastroesophageal reflux disease, esophagitis presence not specified    49yo M who p/w 2 years of constant chest pain and reflux symptoms as well as worsening cough despite quitting smoking 2 weeks ago. No fevers or viral symptoms. Pt with normal respiratory effort and no significant wheezing on exam. EKG unremarkable w/ no ischemic changes and CXR normal. Obtained labs listed above. Labwork unremarkable.  Patient has no fevers and given that he quit smoking 2 weeks ago and now has worsening cough, I suspect that his cough is related to smoking cessation rather than  infectious etiology. His normal VS and WOB and normal pulm exam is reassuring. Regarding chest pain, it has been constant for 2 years and is associated with several other features suggesting poorly controlled GERD, including hoarseness, more discomfort waking up in the morning, and reflux symptoms immediately after eating. Given chronic nature of constant CP, I feel ACS is extremely unlikely. I have instructed patient on diet modification and taking medication prior to eating. I have also instructed him to f/u with PCP for further testing and GI referral if sx don't improve. Reviewed return precautions including change in his chest pain, sudden onset of chest pain or shortness of breath, or fever. Patient discharged in satisfactory condition.  Laurence Spates, MD 02/07/15 [phone]

## 2015-02-07 NOTE — ED Notes (Signed)
Patient transported to X-ray 

## 2015-04-17 ENCOUNTER — Encounter (HOSPITAL_COMMUNITY)
Admission: RE | Disposition: A | Discharge status: Home / Self Care | Payer: Self-pay | Source: Ambulatory Visit | Attending: Gastroenterology

## 2015-04-17 ENCOUNTER — Ambulatory Visit (HOSPITAL_COMMUNITY)
Admission: RE | Admit: 2015-04-17 | Discharge: 2015-04-17 | Disposition: A | Discharge status: Home / Self Care | Payer: 59 | Source: Ambulatory Visit | Attending: Gastroenterology | Admitting: Gastroenterology

## 2015-04-17 DIAGNOSIS — K219 Gastro-esophageal reflux disease without esophagitis: Secondary | ICD-10-CM | POA: Insufficient documentation

## 2015-04-17 HISTORY — PX: PH IMPEDANCE STUDY: SHX5565

## 2015-04-17 HISTORY — PX: ESOPHAGEAL MANOMETRY: SHX5429

## 2015-04-17 SURGERY — IMPEDANCE PH STUDY, ESOPHAGUS

## 2015-04-17 MED ORDER — LIDOCAINE VISCOUS 2 % MT SOLN
OROMUCOSAL | Status: AC
  Filled 2015-04-17: qty 15

## 2015-04-17 SURGICAL SUPPLY — 2 items
FACESHIELD LNG OPTICON STERILE (SAFETY) IMPLANT
GLOVE BIO SURGEON STRL SZ8 (GLOVE) ×4 IMPLANT

## 2015-04-18 ENCOUNTER — Encounter (HOSPITAL_COMMUNITY): Payer: Self-pay | Admitting: Gastroenterology

## 2015-05-04 NOTE — H&P (Signed)
  Ryan [name] S Mode HPI: This is a 49 year old male with complaints chest pain and GERD despite treatment with PPI.  He is here today for a manometry and pH Impedence.  Past Medical History  Diagnosis Date  . Chest pain since 02-11-2013    pt thinks associated with reflux  . H/O hiatal hernia   . GERD (gastroesophageal reflux disease)   . Arthritis     oa    Past Surgical History  Procedure Laterality Date  . Foot surgery      right  . Ankle surgery Right     reconstruction to muscle  . Knee surgery      left arthroscopy x 2  . Hip surgery  age 58    left, done x 2  . Urethra surgery   2053yrs ago    for scar tissue  . Total hip arthroplasty Left 02/23/2013    Procedure: LEFT TOTAL HIP ARTHROPLASTY ANTERIOR APPROACH;  Surgeon: Shelda PalMatthew D Olin, MD;  Location: [facility];  Service: Orthopedics;  Laterality: Left;  . Ph impedance study N/A 04/17/2015    Procedure: PH IMPEDANCE STUDY;  Surgeon: Charna ElizabethJyothi Mann, MD;  Location: [facility];  Service: Endoscopy;  Laterality: N/A;  . Esophageal manometry N/A 04/17/2015    Procedure: ESOPHAGEAL MANOMETRY (EM);  Surgeon: Charna ElizabethJyothi Mann, MD;  Location: [facility];  Service: Endoscopy;  Laterality: N/A;    No family history on file.  Social History:  reports that he has been smoking Cigarettes.  He has a 37.5 pack-year smoking history. He has never used smokeless tobacco. He reports that he drinks alcohol. He reports that he does not use illicit drugs.  Allergies: No Known Allergies  Medications: Scheduled: Continuous:  No results found for this or any previous visit (from the past 24 hour(s)).   No results found.  ROS:  As stated above in the HPI otherwise negative.  Height 6\' 2"  (1.88 m).    PE: Nursing procedure.  No PE performed.  Assessment/Plan: 1) GERD - Manometry and pH Impedence.  Zebbie Ace D 05/04/2015, 12:38 PM

## 2015-08-08 DIAGNOSIS — M722 Plantar fascial fibromatosis: Secondary | ICD-10-CM | POA: Insufficient documentation

## 2015-08-22 ENCOUNTER — Encounter (HOSPITAL_BASED_OUTPATIENT_CLINIC_OR_DEPARTMENT_OTHER): Payer: Self-pay | Admitting: *Deleted

## 2015-08-23 ENCOUNTER — Encounter (HOSPITAL_BASED_OUTPATIENT_CLINIC_OR_DEPARTMENT_OTHER): Payer: Self-pay | Admitting: *Deleted

## 2015-08-23 ENCOUNTER — Other Ambulatory Visit: Payer: Self-pay | Admitting: Otolaryngology

## 2015-08-29 ENCOUNTER — Ambulatory Visit (HOSPITAL_BASED_OUTPATIENT_CLINIC_OR_DEPARTMENT_OTHER)
Admission: RE | Admit: 2015-08-29 | Discharge: 2015-08-29 | Disposition: A | Discharge status: Home / Self Care | Payer: BLUE CROSS/BLUE SHIELD | Source: Ambulatory Visit | Attending: Otolaryngology | Admitting: Otolaryngology

## 2015-08-29 ENCOUNTER — Encounter (HOSPITAL_BASED_OUTPATIENT_CLINIC_OR_DEPARTMENT_OTHER)
Admission: RE | Disposition: A | Discharge status: Home / Self Care | Payer: Self-pay | Source: Ambulatory Visit | Attending: Otolaryngology

## 2015-08-29 ENCOUNTER — Ambulatory Visit (HOSPITAL_BASED_OUTPATIENT_CLINIC_OR_DEPARTMENT_OTHER): Payer: BLUE CROSS/BLUE SHIELD | Admitting: Anesthesiology

## 2015-08-29 ENCOUNTER — Encounter (HOSPITAL_BASED_OUTPATIENT_CLINIC_OR_DEPARTMENT_OTHER): Payer: Self-pay | Admitting: Anesthesiology

## 2015-08-29 DIAGNOSIS — J3489 Other specified disorders of nose and nasal sinuses: Secondary | ICD-10-CM | POA: Diagnosis not present

## 2015-08-29 DIAGNOSIS — J343 Hypertrophy of nasal turbinates: Secondary | ICD-10-CM | POA: Insufficient documentation

## 2015-08-29 DIAGNOSIS — H6983 Other specified disorders of Eustachian tube, bilateral: Secondary | ICD-10-CM | POA: Diagnosis not present

## 2015-08-29 DIAGNOSIS — J310 Chronic rhinitis: Secondary | ICD-10-CM | POA: Diagnosis not present

## 2015-08-29 DIAGNOSIS — F419 Anxiety disorder, unspecified: Secondary | ICD-10-CM | POA: Diagnosis not present

## 2015-08-29 DIAGNOSIS — J342 Deviated nasal septum: Secondary | ICD-10-CM | POA: Diagnosis present

## 2015-08-29 DIAGNOSIS — H908 Mixed conductive and sensorineural hearing loss, unspecified: Secondary | ICD-10-CM | POA: Insufficient documentation

## 2015-08-29 DIAGNOSIS — E669 Obesity, unspecified: Secondary | ICD-10-CM | POA: Diagnosis not present

## 2015-08-29 DIAGNOSIS — Z87891 Personal history of nicotine dependence: Secondary | ICD-10-CM | POA: Diagnosis not present

## 2015-08-29 HISTORY — PX: TURBINATE REDUCTION: SHX6157

## 2015-08-29 HISTORY — PX: SEPTOPLASTY: SHX2393

## 2015-08-29 HISTORY — DX: Anxiety disorder, unspecified: F41.9

## 2015-08-29 HISTORY — PX: MYRINGOTOMY WITH TUBE PLACEMENT: SHX5663

## 2015-08-29 SURGERY — SEPTOPLASTY, NOSE
Anesthesia: General | Site: Nose | Laterality: Right

## 2015-08-29 MED ORDER — MIDAZOLAM HCL 2 MG/2ML IJ SOLN
INTRAMUSCULAR | Status: AC
  Filled 2015-08-29: qty 2

## 2015-08-29 MED ORDER — DEXAMETHASONE SODIUM PHOSPHATE 4 MG/ML IJ SOLN
INTRAMUSCULAR | Status: DC | PRN
  Administered 2015-08-29: 10 mg via INTRAVENOUS

## 2015-08-29 MED ORDER — DEXAMETHASONE SODIUM PHOSPHATE 10 MG/ML IJ SOLN
INTRAMUSCULAR | Status: AC
  Filled 2015-08-29: qty 1

## 2015-08-29 MED ORDER — PHENYLEPHRINE HCL 10 MG/ML IJ SOLN
INTRAMUSCULAR | Status: AC
  Filled 2015-08-29: qty 1

## 2015-08-29 MED ORDER — OXYMETAZOLINE HCL 0.05 % NA SOLN
NASAL | Status: DC | PRN
  Administered 2015-08-29: 1 via TOPICAL

## 2015-08-29 MED ORDER — ONDANSETRON HCL 4 MG/2ML IJ SOLN
INTRAMUSCULAR | Status: AC
  Filled 2015-08-29: qty 2

## 2015-08-29 MED ORDER — MIDAZOLAM HCL 2 MG/2ML IJ SOLN
1.0000 mg | INTRAMUSCULAR | Status: DC | PRN
  Administered 2015-08-29: 2 mg via INTRAVENOUS

## 2015-08-29 MED ORDER — FENTANYL CITRATE (PF) 100 MCG/2ML IJ SOLN
25.0000 ug | INTRAMUSCULAR | Status: DC | PRN
  Administered 2015-08-29 (×2): 50 ug via INTRAVENOUS
  Administered 2015-08-29: 100 ug via INTRAVENOUS

## 2015-08-29 MED ORDER — FENTANYL CITRATE (PF) 100 MCG/2ML IJ SOLN
INTRAMUSCULAR | Status: AC
  Filled 2015-08-29: qty 2

## 2015-08-29 MED ORDER — NEOSTIGMINE METHYLSULFATE 10 MG/10ML IV SOLN
INTRAVENOUS | Status: AC
  Filled 2015-08-29: qty 1

## 2015-08-29 MED ORDER — LIDOCAINE HCL (CARDIAC) 20 MG/ML IV SOLN
INTRAVENOUS | Status: AC
  Filled 2015-08-29: qty 5

## 2015-08-29 MED ORDER — OXYCODONE-ACETAMINOPHEN 5-325 MG PO TABS: 1.0000 | ORAL_TABLET | ORAL | Status: DC | PRN

## 2015-08-29 MED ORDER — SCOPOLAMINE 1 MG/3DAYS TD PT72: 1.0000 | MEDICATED_PATCH | Freq: Once | TRANSDERMAL | Status: DC | PRN

## 2015-08-29 MED ORDER — PROPOFOL 10 MG/ML IV BOLUS
INTRAVENOUS | Status: DC | PRN
  Administered 2015-08-29: 250 mg via INTRAVENOUS

## 2015-08-29 MED ORDER — FENTANYL CITRATE (PF) 100 MCG/2ML IJ SOLN
25.0000 ug | INTRAMUSCULAR | Status: DC | PRN
  Administered 2015-08-29: 50 ug via INTRAVENOUS
  Administered 2015-08-29 (×2): 25 ug via INTRAVENOUS

## 2015-08-29 MED ORDER — GLYCOPYRROLATE 0.2 MG/ML IJ SOLN
INTRAMUSCULAR | Status: AC
  Filled 2015-08-29: qty 2

## 2015-08-29 MED ORDER — LACTATED RINGERS IV SOLN
INTRAVENOUS | Status: DC
  Administered 2015-08-29 (×2): via INTRAVENOUS

## 2015-08-29 MED ORDER — LIDOCAINE HCL (CARDIAC) 20 MG/ML IV SOLN
INTRAVENOUS | Status: DC | PRN
  Administered 2015-08-29: 100 mg via INTRAVENOUS

## 2015-08-29 MED ORDER — SUCCINYLCHOLINE CHLORIDE 20 MG/ML IJ SOLN
INTRAMUSCULAR | Status: DC | PRN
  Administered 2015-08-29: 100 mg via INTRAVENOUS

## 2015-08-29 MED ORDER — AMOXICILLIN 875 MG PO TABS: 875.0000 mg | ORAL_TABLET | Freq: Two times a day (BID) | ORAL | Status: DC

## 2015-08-29 MED ORDER — GLYCOPYRROLATE 0.2 MG/ML IJ SOLN: 0.2000 mg | Freq: Once | INTRAMUSCULAR | Status: DC | PRN

## 2015-08-29 MED ORDER — ONDANSETRON HCL 4 MG/2ML IJ SOLN: 4.0000 mg | Freq: Once | INTRAMUSCULAR | Status: DC | PRN

## 2015-08-29 MED ORDER — FENTANYL CITRATE (PF) 100 MCG/2ML IJ SOLN: 50.0000 ug | INTRAMUSCULAR | Status: DC | PRN

## 2015-08-29 MED ORDER — CIPROFLOXACIN-DEXAMETHASONE 0.3-0.1 % OT SUSP
OTIC | Status: AC
  Filled 2015-08-29: qty 7.5

## 2015-08-29 MED ORDER — LIDOCAINE-EPINEPHRINE 1 %-1:100000 IJ SOLN
INTRAMUSCULAR | Status: DC | PRN
  Administered 2015-08-29: 1.5 mL

## 2015-08-29 MED ORDER — MUPIROCIN 2 % EX OINT
TOPICAL_OINTMENT | CUTANEOUS | Status: AC
  Filled 2015-08-29: qty 22

## 2015-08-29 MED ORDER — BACITRACIN ZINC 500 UNIT/GM EX OINT
TOPICAL_OINTMENT | CUTANEOUS | Status: DC | PRN
  Administered 2015-08-29: 1 via TOPICAL

## 2015-08-29 MED ORDER — ONDANSETRON HCL 4 MG/2ML IJ SOLN
4.0000 mg | Freq: Once | INTRAMUSCULAR | Status: AC | PRN
  Administered 2015-08-29: 4 mg via INTRAVENOUS

## 2015-08-29 SURGICAL SUPPLY — 35 items
ATTRACTOMAT 16X20 MAGNETIC DRP (DRAPES) IMPLANT
BLADE SURG 15 STRL LF DISP TIS (BLADE) IMPLANT
BLADE SURG 15 STRL SS (BLADE)
CANISTER SUCT 1200ML W/VALVE (MISCELLANEOUS) ×4 IMPLANT
COAGULATOR SUCT 8FR VV (MISCELLANEOUS) ×4 IMPLANT
DECANTER SPIKE VIAL GLASS SM (MISCELLANEOUS) IMPLANT
DRSG NASOPORE 8CM (GAUZE/BANDAGES/DRESSINGS) IMPLANT
DRSG TELFA 3X8 NADH (GAUZE/BANDAGES/DRESSINGS) IMPLANT
ELECT REM PT RETURN 9FT ADLT (ELECTROSURGICAL) ×4
ELECTRODE REM PT RTRN 9FT ADLT (ELECTROSURGICAL) ×3 IMPLANT
GLOVE BIO SURGEON STRL SZ7.5 (GLOVE) ×4 IMPLANT
GLOVE SURG SS PI 7.0 STRL IVOR (GLOVE) ×1 IMPLANT
GOWN STRL REUS W/ TWL LRG LVL3 (GOWN DISPOSABLE) ×6 IMPLANT
GOWN STRL REUS W/TWL LRG LVL3 (GOWN DISPOSABLE) ×8
NDL HYPO 25X1 1.5 SAFETY (NEEDLE) ×3 IMPLANT
NEEDLE HYPO 25X1 1.5 SAFETY (NEEDLE) ×4 IMPLANT
NS IRRIG 1000ML POUR BTL (IV SOLUTION) ×4 IMPLANT
PACK BASIN DAY SURGERY FS (CUSTOM PROCEDURE TRAY) ×4 IMPLANT
PACK ENT DAY SURGERY (CUSTOM PROCEDURE TRAY) ×4 IMPLANT
PACKING NASAL EPIS 4X2.4 XEROG (MISCELLANEOUS) IMPLANT
PAD DRESSING TELFA 3X8 NADH (GAUZE/BANDAGES/DRESSINGS) IMPLANT
PATTIES SURGICAL .5 X3 (DISPOSABLE) IMPLANT
SLEEVE SCD COMPRESS KNEE MED (MISCELLANEOUS) ×4 IMPLANT
SOLUTION BUTLER CLEAR DIP (MISCELLANEOUS) ×4 IMPLANT
SPLINT NASAL AIRWAY SILICONE (MISCELLANEOUS) ×4 IMPLANT
SPONGE GAUZE 2X2 8PLY STRL LF (GAUZE/BANDAGES/DRESSINGS) ×4 IMPLANT
SPONGE NEURO XRAY DETECT 1X3 (DISPOSABLE) ×4 IMPLANT
SUT CHROMIC 4 0 P 3 18 (SUTURE) ×4 IMPLANT
SUT PLAIN 4 0 ~~LOC~~ 1 (SUTURE) ×4 IMPLANT
SUT PROLENE 3 0 PS 2 (SUTURE) ×4 IMPLANT
SUT VIC AB 4-0 P-3 18XBRD (SUTURE) IMPLANT
SUT VIC AB 4-0 P3 18 (SUTURE)
TOWEL OR 17X24 6PK STRL BLUE (TOWEL DISPOSABLE) ×4 IMPLANT
TUBE SALEM SUMP 16 FR W/ARV (TUBING) IMPLANT
YANKAUER SUCT BULB TIP NO VENT (SUCTIONS) IMPLANT

## 2015-08-29 NOTE — H&P (Signed)
Cc: Chronic nasal obstruction, right ear hearing loss  HPI: The patient is a 50 year old male who presents today complaining of pressure sensation in the right ear and right ear hearing loss.  He also complains of chronic nasal obstruction for more than 20 years.  According to the patient, he has a history of eustachian tube dysfunction.  He was previously seen by Dr. Ezzard Standing 4 years ago.  He was noted to have right middle ear effusion and conductive hearing loss.  He underwent a myringotomy procedure with temporary improvement in his right ear hearing.  Over the past 2 months, he has been experiencing increasing pressure sensation in the right ear.  He also noted muffled hearing on the right side.  He denies any significant otalgia, otorrhea or vertigo.  The patient also has a long history of chronic nasal obstruction.  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  His nasal obstruction is worse on the right side.  He was previously treated with steroid nasal sprays without significant improvement in his symptoms.  Currently he denies any facial pain, fever, or visual change.    The patient's review of systems (constitutional, eyes, ENT, cardiovascular, respiratory, GI, musculoskeletal, skin, neurologic, psychiatric, endocrine, hematologic, allergic) is noted in the ROS questionnaire.  It is reviewed with the patient.  Allergies: NKDA.   Major events: Hip and ankle surgery.   Ongoing medical problems: Reflux.   Social history: The patient is married. He is a former smoker. He drinks alcohol few times a year. He denies the use of illegal drugs.   Exam General: Communicates without difficulty, well nourished, no acute distress. Head: Normocephalic, no evidence injury, no tenderness, facial buttresses intact without stepoff. Eyes: PERRL, EOMI. No scleral icterus, conjunctivae clear. Neuro: CN II exam reveals vision grossly intact.  No nystagmus at any point of gaze. Ears:  Auricles well formed without lesions.  Ear canals are intact without mass or lesion.  No erythema or edema is appreciated.  The right tympanic membrane is significantly retracted.  Nose: External evaluation reveals normal support and skin without lesions.  Dorsum is intact.  Anterior rhinoscopy reveals severely congested mucosa over anterior aspect of inferior turbinates and intact septum.  No purulence noted. Severe NSD to the right. Oral:  Oral cavity and oropharynx are intact, symmetric, without erythema or edema.  Mucosa is moist without lesions. Neck: Full range of motion without pain.  There is no significant lymphadenopathy.  No masses palpable.  Thyroid bed within normal limits to palpation.  Parotid glands and submandibular glands equal bilaterally without mass.  Trachea is midline. Neuro:  CN 2-12 grossly intact. Gait normal.   Procedure:  Flexible Nasal Endoscopy: Risks, benefits, and alternatives of flexible endoscopy were explained to the patient.  Specific mention was made of the risk of throat numbness with difficulty swallowing, possible bleeding from the nose and mouth, and pain from the procedure.  The patient gave oral consent to proceed.  The nasal cavities were decongested and anesthetised with a combination of oxymetazoline and 4% lidocaine solution.  The flexible scope was inserted into the right nasal cavity.  Endoscopy of the inferior and middle meatus was performed.  The edematous mucosa was as described above.  No polyp, mass, or lesion was appreciated.  Severe NSD to the right. Olfactory cleft was clear.  Nasopharynx was clear.  Turbinates were hypertrophied but without mass.  Incomplete response to decongestion.  The procedure was repeated on the contralateral  side with similar findings.  The patient tolerated the procedure well.  Instructions were given to avoid eating or drinking for 2 hours.   AUDIOMETRIC TESTING:  Shows right ear mixed hearing loss. The speech reception threshold  is 15dB AD and 10dB AS. The discrimination score is 96% AD and 100% AS. The tympanogram is flat on the right.   Assessment 1.  Right eustachian tube dysfunction with right middle ear effusion.  The right tympanic membrane is significantly retracted.   2.  Right ear mixed hearing loss.  3.  Chronic right rhinitis with significant mucosal congestion.  His nasal septum is severely deviated to the right.   4.  Bilateral inferior turbinate hypertrophy is also noted.  More than 95% of his nasal passageway is obstructed bilaterally.    Plan  1. The physical exam and nasal endoscopy findings are reviewed with the patient.  The hearing test results are also reviewed.  2.  Prednisone Dosepak for 6 days.  The patient is encouraged to use the Flonase nasal spray.   3.  The patient will return for re-evaluation in 1 week.  If his middle ear effusion and eustachian tube dysfunction do not improve, he may need to undergo the right myringotomy and tube placement procedure.  4.  Based on the nasal endoscopy findings, the patient is also a candidate to undergo septoplasty and bilateral inferior turbinate reduction to improve his nasal passageway.  The risks, benefits, alternatives and details of the procedures are extensively discussed.   5.  The patient would like to proceed with the procedures.  6.  We will schedule the procedures in accordance with the patient's schedule.

## 2015-08-29 NOTE — Discharge Instructions (Addendum)

## 2015-08-29 NOTE — Anesthesia Postprocedure Evaluation (Signed)
Anesthesia Post Note  Patient: Ryan [name]  Procedure(s) Performed: Procedure(s) (LRB): SEPTOPLASTY (N/A) TURBINATE REDUCTION (Bilateral) MYRINGOTOMY WITH TUBE PLACEMENT (Right)  Patient location during evaluation: PACU Anesthesia Type: General Level of consciousness: awake and alert Pain management: pain level controlled Vital Signs Assessment: post-procedure vital signs reviewed and stable Respiratory status: spontaneous breathing, nonlabored ventilation, respiratory function stable and patient connected to nasal cannula oxygen Cardiovascular status: blood pressure returned to baseline and stable Postop Assessment: no signs of nausea or vomiting Anesthetic complications: no    Last Vitals:  Filed Vitals:   08/29/15 1300 08/29/15 1315  BP: 151/99 146/95  Pulse: 69 68  Temp:    Resp: 17 10    Last Pain:  Filed Vitals:   08/29/15 1324  PainSc: 3                  Kyla Duffy JENNETTE

## 2015-08-29 NOTE — Anesthesia Procedure Notes (Signed)
Procedure Name: Intubation Date/Time: 08/29/2015 11:04 AM Performed by: Caren Macadam Pre-anesthesia Checklist: Patient identified, Emergency Drugs available, Suction available and Patient being monitored Patient Re-evaluated:Patient Re-evaluated prior to inductionOxygen Delivery Method: Circle System Utilized Preoxygenation: Pre-oxygenation with 100% oxygen Intubation Type: IV induction Ventilation: Mask ventilation without difficulty Laryngoscope Size: Miller and 2 Grade View: Grade I Tube type: Oral Tube size: 8.0 mm Number of attempts: 1 Airway Equipment and Method: Stylet and Oral airway Placement Confirmation: ETT inserted through vocal cords under direct vision,  positive ETCO2 and breath sounds checked- equal and bilateral Secured at: 26 cm Tube secured with: Tape Dental Injury: Teeth and Oropharynx as per pre-operative assessment

## 2015-08-29 NOTE — Op Note (Signed)
DATE OF PROCEDURE: 08/29/2015  OPERATIVE REPORT   SURGEON: Newman Pies, MD   PREOPERATIVE DIAGNOSES:  1. Severe nasal septal deviation.  2. Bilateral inferior turbinate hypertrophy.  3. Chronic nasal obstruction. 4. Right eustachian tube dysfunction with right middle ear effusion.  POSTOPERATIVE DIAGNOSES:  1. Severe nasal septal deviation.  2. Bilateral inferior turbinate hypertrophy.  3. Chronic nasal obstruction. 4. Right eustachian tube dysfunction with right middle ear effusion.  PROCEDURE PERFORMED:  1. Septoplasty.  2. Bilateral partial inferior turbinate resection.  3. Right myringotomy with placement of T-tube.  ANESTHESIA: General endotracheal tube anesthesia.   COMPLICATIONS: None.   ESTIMATED BLOOD LOSS: Less than 50 mL.   INDICATION FOR PROCEDURE: Ryan [name] is a 50 y.o. male with a history of chronic nasal obstruction. The patient was  treated with antihistamine, decongestant, steroid nasal spray, and systemic steroids. However, the patient continues to be symptomatic. On examination, the patient was noted to have bilateral severe inferior turbinate hypertrophy and significant nasal septal deviation, causing significant nasal obstruction. In addition, the patient was also noted to have right eustachian tube dysfunction and right middle ear effusion. It resulted in significant right ear hearing loss. Based on the above findings, the decision was made for the patient to undergo the above-stated procedure. The risks, benefits, alternatives, and details of the procedure were discussed with the patient. Questions were invited and answered. Informed consent was obtained.   DESCRIPTION OF PROCEDURE: The patient was taken to the operating room and placed supine on the operating table. General endotracheal tube anesthesia was administered by the anesthesiologist. The patient was positioned, and prepped and draped in the standard fashion for nasal surgery. Pledgets soaked with Afrin  were placed in both nasal cavities for decongestion. The pledgets were subsequently removed. The above mentioned severe septal deviation was again noted. 1% lidocaine with 1:100,000 epinephrine was injected onto the nasal septum bilaterally. A hemitransfixion incision was made on the left side. The mucosal flap was carefully elevated on the left side. A cartilaginous incision was made 1 cm superior to the caudal margin of the nasal septum. Mucosal flap was also elevated on the right side in the similar fashion. It should be noted that due to the severe septal deviation, the deviated portion of the cartilaginous and bony septum had to be removed in piecemeal fashion. Once the deviated portions were removed, a straight midline septum was achieved. The septum was then quilted with 4-0 plain gut sutures. The hemitransfixion incision was closed with interrupted 4-0 chromic sutures. Doyle splints were applied.   Prior to the [facility] splint application, the inferior one half of both hypertrophied inferior turbinate was crossclamped with a Kelly clamp. The inferior one half of each inferior turbinate was then resected with a pair of cross cutting scissors. Hemostasis was achieved with a suction cautery device.   Under the operating microscope, the right ear canal was cleaned of all cerumen. The right tympanic membrane was noted to be significantly retracted. A standard myringotomy incision was made at the anterior-inferior quadrant on the tympanic membrane. A copious amount of serous fluid was suctioned from behind the tympanic membrane. A T tube was placed, followed by antibiotic eardrops in the ear canal.  The care of the patient was turned over to the anesthesiologist. The patient was awakened from anesthesia without difficulty. The patient was extubated and transferred to the recovery room in good condition.   OPERATIVE FINDINGS: Severe nasal septal deviation and bilateral inferior turbinate hypertrophy. Right  middle  ear effusion  SPECIMEN: None.   FOLLOWUP CARE: The patient be discharged home once he is awake and alert. The patient will be placed on Percocet 1-2 tablets p.o. q.6 hours p.r.n. pain, and amoxicillin 875 mg p.o. b.i.d. for 5 days. The patient will follow up in my office in approximately 1 week for splint removal.   Ryan Silliman Philomena Doheny, MD

## 2015-08-29 NOTE — Anesthesia Preprocedure Evaluation (Signed)
Anesthesia Evaluation  Patient identified by MRN, date of birth, ID band Patient awake    Reviewed: Allergy & Precautions, NPO status , Patient's Chart, lab work & pertinent test results  History of Anesthesia Complications Negative for: history of anesthetic complications  Airway Mallampati: II  TM Distance: >3 FB Neck ROM: Full    Dental no notable dental hx. (+) Dental Advisory Given   Pulmonary former smoker,    Pulmonary exam normal breath sounds clear to auscultation       Cardiovascular negative cardio ROS Normal cardiovascular exam Rhythm:Regular Rate:Normal     Neuro/Psych PSYCHIATRIC DISORDERS Anxiety negative neurological ROS     GI/Hepatic Neg liver ROS, GERD  Medicated and Controlled,  Endo/Other  obesity  Renal/GU negative Renal ROS  negative genitourinary   Musculoskeletal  (+) Arthritis ,   Abdominal   Peds negative pediatric ROS (+)  Hematology negative hematology ROS (+)   Anesthesia Other Findings   Reproductive/Obstetrics negative OB ROS                             Anesthesia Physical Anesthesia Plan  ASA: II  Anesthesia Plan: General   Post-op Pain Management:    Induction: Intravenous  Airway Management Planned: Oral ETT  Additional Equipment:   Intra-op Plan:   Post-operative Plan: Extubation in OR  Informed Consent: I have reviewed the patients History and Physical, chart, labs and discussed the procedure including the risks, benefits and alternatives for the proposed anesthesia with the patient or authorized representative who has indicated his/her understanding and acceptance.   Dental advisory given  Plan Discussed with: CRNA  Anesthesia Plan Comments:         Anesthesia Quick Evaluation

## 2015-08-29 NOTE — Transfer of Care (Signed)
Immediate Anesthesia Transfer of Care Note  Patient: Ryan [name]  Procedure(s) Performed: Procedure(s): SEPTOPLASTY (N/A) TURBINATE REDUCTION (Bilateral) MYRINGOTOMY WITH TUBE PLACEMENT (Right)  Patient Location: PACU  Anesthesia Type:General  Level of Consciousness: awake and alert   Airway & Oxygen Therapy: Patient Spontanous Breathing and Patient connected to face mask oxygen  Post-op Assessment: Report given to RN and Post -op Vital signs reviewed and stable  Post vital signs: Reviewed and stable  Last Vitals:  Filed Vitals:   08/29/15 1213  Pulse: 79  Resp: 14    Complications: No apparent anesthesia complications

## 2015-08-30 ENCOUNTER — Encounter (HOSPITAL_BASED_OUTPATIENT_CLINIC_OR_DEPARTMENT_OTHER): Payer: Self-pay | Admitting: Otolaryngology

## 2015-11-07 DIAGNOSIS — E785 Hyperlipidemia, unspecified: Secondary | ICD-10-CM | POA: Insufficient documentation

## 2015-11-14 ENCOUNTER — Ambulatory Visit (HOSPITAL_COMMUNITY)
Admission: EM | Admit: 2015-11-14 | Discharge: 2015-11-14 | Disposition: A | Discharge status: Home / Self Care | Payer: BLUE CROSS/BLUE SHIELD | Attending: Family Medicine | Admitting: Family Medicine

## 2015-11-14 ENCOUNTER — Encounter (HOSPITAL_COMMUNITY): Payer: Self-pay | Admitting: Emergency Medicine

## 2015-11-14 DIAGNOSIS — L03012 Cellulitis of left finger: Secondary | ICD-10-CM | POA: Diagnosis not present

## 2015-11-14 MED ORDER — CEPHALEXIN 500 MG PO CAPS: 500.0000 mg | ORAL_CAPSULE | Freq: Four times a day (QID) | ORAL | Status: DC

## 2015-11-14 NOTE — ED Provider Notes (Signed)
CSN: [id number]     Arrival date & time 11/14/15  1317 History   First MD Initiated Contact with Patient 11/14/15 1410     Chief Complaint  Patient presents with  . Nail Problem   (Consider location/radiation/quality/duration/timing/severity/associated sxs/prior Treatment) HPI  History obtained from patient: Location: Left great toe  Context/Duration: 1 week patient had a pedicure approximately 1-1/2 weeks ago.  Severity: 4  Quality: Aching, throbbing Timing: Constant           Home Treatment: Attempted to poke a hole with a needle Associated symptoms:  Hurts to walk on foot Family History: Social History:   Past Medical History  Diagnosis Date  . Chest pain since 02-11-2013    pt thinks associated with reflux  . H/O hiatal hernia   . GERD (gastroesophageal reflux disease)   . Arthritis     oa  . Anxiety    Past Surgical History  Procedure Laterality Date  . Foot surgery      right  . Ankle surgery Right     reconstruction to muscle  . Hip surgery  age 74    left, done x 2  . Urethra surgery   2644yrs ago    for scar tissue  . Total hip arthroplasty Left 02/23/2013    Procedure: LEFT TOTAL HIP ARTHROPLASTY ANTERIOR APPROACH;  Surgeon: Shelda PalMatthew D Olin, MD;  Location: [facility];  Service: Orthopedics;  Laterality: Left;  . Ph impedance study N/A 04/17/2015    Procedure: PH IMPEDANCE STUDY;  Surgeon: Charna ElizabethJyothi Mann, MD;  Location: [facility];  Service: Endoscopy;  Laterality: N/A;  . Esophageal manometry N/A 04/17/2015    Procedure: ESOPHAGEAL MANOMETRY (EM);  Surgeon: Charna ElizabethJyothi Mann, MD;  Location: [facility];  Service: Endoscopy;  Laterality: N/A;  . Knee arthroscopy Left 10/20/2003  . Esophagogastroduodenoscopy  11/09/2003  . Septoplasty N/A 08/29/2015    Procedure: SEPTOPLASTY;  Surgeon: Newman PiesSu Teoh, MD;  Location: [facility];  Service: ENT;  Laterality: N/A;  . Turbinate reduction Bilateral 08/29/2015    Procedure: TURBINATE REDUCTION;  Surgeon: Newman PiesSu Teoh, MD;   Location: [facility];  Service: ENT;  Laterality: Bilateral;  . Myringotomy with tube placement Right 08/29/2015    Procedure: MYRINGOTOMY WITH TUBE PLACEMENT;  Surgeon: Newman PiesSu Teoh, MD;  Location: [facility];  Service: ENT;  Laterality: Right;   History reviewed. No pertinent family history. Social History  Substance Use Topics  . Smoking status: Former Smoker -- 1.50 packs/day for 25 years    Types: Cigarettes  . Smokeless tobacco: Former NeurosurgeonUser    Quit date: 05/10/2015  . Alcohol Use: Yes     Comment: ocassionally    Review of Systems ROS +'ve left great toe infection  Denies: HEADACHE, NAUSEA, ABDOMINAL PAIN, CHEST PAIN, CONGESTION, DYSURIA, SHORTNESS OF BREATH  Allergies  Review of patient's allergies indicates no known allergies.  Home Medications   Prior to Admission medications   Medication Sig Start Date End Date Taking? Authorizing Provider  amoxicillin (AMOXIL) 875 MG tablet Take 1 tablet (875 mg total) by mouth 2 (two) times daily. 08/29/15   Newman PiesSu Teoh, MD  cephALEXin (KEFLEX) 500 MG capsule Take 1 capsule (500 mg total) by mouth 4 (four) times daily. 11/14/15   Tharon AquasFrank C Boots Mcglown, PA  omeprazole (PRILOSEC) 20 MG capsule Take 1 capsule (20 mg total) by mouth 2 (two) times daily before a meal. 02/07/15   Laurence Spatesachel Morgan Little, MD  oxyCODONE-acetaminophen (ROXICET) 5-325 MG tablet Take 1 tablet  by mouth every 4 (four) hours as needed for severe pain. 08/29/15   Newman Pies, MD  predniSONE (STERAPRED UNI-PAK 21 TAB) 10 MG (21) TBPK tablet Take 10 mg by mouth daily.    Historical Provider, MD   Meds Ordered and Administered this Visit  Medications - No data to display  BP 136/81 mmHg  Pulse 71  Temp(Src) 98 F (36.7 C) (Oral)  Resp 12  SpO2 97% No data found.   Physical Exam NURSES NOTES AND VITAL SIGNS REVIEWED. CONSTITUTIONAL: Well developed, well nourished, no acute distress HEENT: normocephalic, atraumatic EYES: Conjunctiva normal NECK:normal ROM,  supple, no adenopathy PULMONARY:No respiratory distress, normal effort ABDOMINAL: Soft, ND, NT BS+, No CVAT MUSCULOSKELETAL: Normal ROM of all extremities, left great toe red swollen tender to palpation on the medial aspect of the toe nail. Not fluctuant. SKIN: warm and dry without rash PSYCHIATRIC: Mood and affect, behavior are normal  ED Course  Procedures (including critical care time)  Labs Review Labs Reviewed - No data to display  Imaging Review No results found.   Visual Acuity Review  Right Eye Distance:   Left Eye Distance:   Bilateral Distance:    Right Eye Near:   Left Eye Near:    Bilateral Near:      Prescription for Keflex   MDM   1. Paronychia, left     Patient is reassured that there are no issues that require transfer to higher level of care at this time or additional tests. Patient is advised to continue home symptomatic treatment. Patient is advised that if there are new or worsening symptoms to attend the emergency department, contact primary care provider, or return to [facility]. Instructions of care provided discharged home in stable condition.    THIS NOTE WAS GENERATED USING A VOICE RECOGNITION SOFTWARE PROGRAM. ALL REASONABLE EFFORTS  WERE MADE TO PROOFREAD THIS DOCUMENT FOR ACCURACY.  I have verbally reviewed the discharge instructions with the patient. A printed AVS was given to the patient.  All questions were answered prior to discharge.      Tharon Aquas, PA 11/14/15 1431

## 2015-11-14 NOTE — ED Notes (Signed)
C/o left big toe States he went got toes done with his wife two fridays ago States area is red, sensitive to touch and did have white discharge Wife did use safety pin to poke a hole in touch

## 2015-11-14 NOTE — Discharge Instructions (Signed)
Paronychia °Paronychia is an infection of the skin that surrounds a nail. It usually affects the skin around a fingernail, but it may also occur near a toenail. It often causes pain and swelling around the nail. This condition may come on suddenly or develop over a longer period. In some cases, a collection of pus (abscess) can form near or under the nail. Usually, paronychia is not serious and it clears up with treatment. °CAUSES °This condition may be caused by bacteria or fungi. It is commonly caused by either Streptococcus or Staphylococcus bacteria. The bacteria or fungi often cause the infection by getting into the affected area through an opening in the skin, such as a cut or a hangnail. °RISK FACTORS °This condition is more likely to develop in: °· People who get their hands wet often, such as those who work as dishwashers, bartenders, or nurses. °· People who bite their fingernails or suck their thumbs. °· People who trim their nails too short. °· People who have hangnails or injured fingertips. °· People who get manicures. °· People who have diabetes. °SYMPTOMS °Symptoms of this condition include: °· Redness and swelling of the skin near the nail. °· Tenderness around the nail when you touch the area. °· Pus-filled bumps under the cuticle. The cuticle is the skin at the base or sides of the nail. °· Fluid or pus under the nail. °· Throbbing pain in the area. °DIAGNOSIS °This condition is usually diagnosed with a physical exam. In some cases, a sample of pus may be taken from an abscess to be tested in a lab. This can help to determine what type of bacteria or fungi is causing the condition. °TREATMENT °Treatment for this condition depends on the cause and severity of the condition. If the condition is mild, it may clear up on its own in a few days. Your health care provider may recommend soaking the affected area in warm water a few times a day. When treatment is needed, the options may  include: °· Antibiotic medicine, if the condition is caused by a bacterial infection. °· Antifungal medicine, if the condition is caused by a fungal infection. °· Incision and drainage, if an abscess is present. In this procedure, the health care provider will cut open the abscess so the pus can drain out. °HOME CARE INSTRUCTIONS °· Soak the affected area in warm water if directed to do so by your health care provider. You may be told to do this for 20 minutes, 2-3 times a day. Keep the area dry in between soakings. °· Take medicines only as directed by your health care provider. °· If you were prescribed an antibiotic medicine, finish all of it even if you start to feel better. °· Keep the affected area clean. °· Do not try to drain a fluid-filled bump yourself. °· If you will be washing dishes or performing other tasks that require your hands to get wet, wear rubber gloves. You should also wear gloves if your hands might come in contact with irritating substances, such as cleaners or chemicals. °· Follow your health care provider's instructions about: °¨ Wound care. °¨ Bandage (dressing) changes and removal. °SEEK MEDICAL CARE IF: °· Your symptoms get worse or do not improve with treatment. °· You have a fever or chills. °· You have redness spreading from the affected area. °· You have continued or increased fluid, blood, or pus coming from the affected area. °· Your finger or knuckle becomes swollen or is difficult to move. °  °  This information is not intended to replace advice given to you by your health care provider. Make sure you discuss any questions you have with your health care provider. °  °Document Released: 12/11/2000 Document Revised: 11/01/2014 Document Reviewed: 05/25/2014 °Elsevier Interactive Patient Education ©2016 Elsevier Inc. ° °

## 2016-02-01 DIAGNOSIS — M71571 Other bursitis, not elsewhere classified, right ankle and foot: Secondary | ICD-10-CM | POA: Diagnosis not present

## 2016-02-01 DIAGNOSIS — M65871 Other synovitis and tenosynovitis, right ankle and foot: Secondary | ICD-10-CM | POA: Diagnosis not present

## 2016-02-01 DIAGNOSIS — M79671 Pain in right foot: Secondary | ICD-10-CM | POA: Diagnosis not present

## 2016-02-01 DIAGNOSIS — M71572 Other bursitis, not elsewhere classified, left ankle and foot: Secondary | ICD-10-CM | POA: Diagnosis not present

## 2016-02-01 DIAGNOSIS — M65872 Other synovitis and tenosynovitis, left ankle and foot: Secondary | ICD-10-CM | POA: Diagnosis not present

## 2016-02-01 DIAGNOSIS — M722 Plantar fascial fibromatosis: Secondary | ICD-10-CM | POA: Diagnosis not present

## 2016-02-01 DIAGNOSIS — M79672 Pain in left foot: Secondary | ICD-10-CM | POA: Diagnosis not present

## 2016-04-09 DIAGNOSIS — H7201 Central perforation of tympanic membrane, right ear: Secondary | ICD-10-CM | POA: Diagnosis not present

## 2016-04-09 DIAGNOSIS — H6121 Impacted cerumen, right ear: Secondary | ICD-10-CM | POA: Diagnosis not present

## 2016-04-09 DIAGNOSIS — H903 Sensorineural hearing loss, bilateral: Secondary | ICD-10-CM | POA: Diagnosis not present

## 2016-04-09 DIAGNOSIS — J310 Chronic rhinitis: Secondary | ICD-10-CM | POA: Diagnosis not present

## 2016-04-24 DIAGNOSIS — M71571 Other bursitis, not elsewhere classified, right ankle and foot: Secondary | ICD-10-CM | POA: Diagnosis not present

## 2016-04-24 DIAGNOSIS — M722 Plantar fascial fibromatosis: Secondary | ICD-10-CM | POA: Diagnosis not present

## 2016-05-29 DIAGNOSIS — K449 Diaphragmatic hernia without obstruction or gangrene: Secondary | ICD-10-CM | POA: Diagnosis not present

## 2016-05-29 DIAGNOSIS — K219 Gastro-esophageal reflux disease without esophagitis: Secondary | ICD-10-CM | POA: Diagnosis not present

## 2016-09-02 DIAGNOSIS — M25571 Pain in right ankle and joints of right foot: Secondary | ICD-10-CM | POA: Diagnosis not present

## 2016-09-02 DIAGNOSIS — M25562 Pain in left knee: Secondary | ICD-10-CM | POA: Diagnosis not present

## 2016-12-10 DIAGNOSIS — G44009 Cluster headache syndrome, unspecified, not intractable: Secondary | ICD-10-CM | POA: Diagnosis not present

## 2016-12-10 DIAGNOSIS — Z049 Encounter for examination and observation for unspecified reason: Secondary | ICD-10-CM | POA: Diagnosis not present

## 2016-12-10 DIAGNOSIS — R51 Headache: Secondary | ICD-10-CM | POA: Diagnosis not present

## 2016-12-10 DIAGNOSIS — Z79899 Other long term (current) drug therapy: Secondary | ICD-10-CM | POA: Diagnosis not present

## 2016-12-24 DIAGNOSIS — M791 Myalgia: Secondary | ICD-10-CM | POA: Diagnosis not present

## 2016-12-24 DIAGNOSIS — M542 Cervicalgia: Secondary | ICD-10-CM | POA: Diagnosis not present

## 2016-12-24 DIAGNOSIS — G44009 Cluster headache syndrome, unspecified, not intractable: Secondary | ICD-10-CM | POA: Diagnosis not present

## 2016-12-24 DIAGNOSIS — G518 Other disorders of facial nerve: Secondary | ICD-10-CM | POA: Diagnosis not present

## 2017-01-13 DIAGNOSIS — G44009 Cluster headache syndrome, unspecified, not intractable: Secondary | ICD-10-CM | POA: Diagnosis not present

## 2017-01-13 DIAGNOSIS — M542 Cervicalgia: Secondary | ICD-10-CM | POA: Diagnosis not present

## 2017-01-13 DIAGNOSIS — G518 Other disorders of facial nerve: Secondary | ICD-10-CM | POA: Diagnosis not present

## 2017-01-13 DIAGNOSIS — M791 Myalgia: Secondary | ICD-10-CM | POA: Diagnosis not present

## 2017-01-28 DIAGNOSIS — G44009 Cluster headache syndrome, unspecified, not intractable: Secondary | ICD-10-CM | POA: Diagnosis not present

## 2017-02-11 DIAGNOSIS — G5600 Carpal tunnel syndrome, unspecified upper limb: Secondary | ICD-10-CM | POA: Diagnosis not present

## 2017-03-11 ENCOUNTER — Ambulatory Visit (INDEPENDENT_AMBULATORY_CARE_PROVIDER_SITE_OTHER): Payer: BLUE CROSS/BLUE SHIELD | Admitting: Emergency Medicine

## 2017-03-11 VITALS — BP 114/75 | HR 65 | Temp 98.4°F | Ht 75.0 in | Wt 228.4 lb

## 2017-03-11 DIAGNOSIS — L03221 Cellulitis of neck: Secondary | ICD-10-CM

## 2017-03-11 DIAGNOSIS — W57XXXA Bitten or stung by nonvenomous insect and other nonvenomous arthropods, initial encounter: Secondary | ICD-10-CM | POA: Diagnosis not present

## 2017-03-11 MED ORDER — DICLOFENAC SODIUM 75 MG PO TBEC: 75.0000 mg | DELAYED_RELEASE_TABLET | Freq: Two times a day (BID) | ORAL | 0 refills | Status: AC

## 2017-03-11 MED ORDER — CEPHALEXIN 500 MG PO CAPS: 500.0000 mg | ORAL_CAPSULE | Freq: Three times a day (TID) | ORAL | 0 refills | Status: AC

## 2017-03-11 NOTE — Patient Instructions (Addendum)
   IF you received an x-ray today, you will receive an invoice from [facility]. Please contact [facility] at [phone] with questions or concerns regarding your invoice.   IF you received labwork today, you will receive an invoice from [facility]. Please contact [facility] at [phone] with questions or concerns regarding your invoice.   Our billing staff will not be able to assist you with questions regarding bills from these companies.  You will be contacted with the lab results as soon as they are available. The fastest way to get your results is to activate your My Chart account. Instructions are located on the last page of this paperwork. If you have not heard from us regarding the results in 2 weeks, please contact this office.      Insect Bite, Adult An insect bite can make your skin red, itchy, and swollen. Some insects can spread disease to people with a bite. However, most insect bites do not lead to disease, and most are not serious. Follow these instructions at home: Bite area care  Do not scratch the bite area.  Keep the bite area clean and dry.  Wash the bite area every day with soap and water as told by your doctor.  Check the bite area every day for signs of infection. Check for: ? More redness, swelling, or pain. ? Fluid or blood. ? Warmth. ? Pus. Managing pain, itching, and swelling  You may put any of these on the bite area as told by your doctor: ? A baking soda paste. ? Cortisone cream. ? Calamine lotion.  If directed, put ice on the bite area. ? Put ice in a plastic bag. ? Place a towel between your skin and the bag. ? Leave the ice on for 20 minutes, 2-3 times a day. Medicines  Take medicines or put medicines on your skin only as told by your doctor.  If you were prescribed an antibiotic medicine, use it as told by your doctor. Do not stop using the antibiotic even if your condition improves. General instructions  Keep all  follow-up visits as told by your doctor. This is important. How is this prevented? To help you have a lower risk of insect bites:  When you are outside, wear clothing that covers your arms and legs.  Use insect repellent. The best insect repellents have: ? An active ingredient of DEET, picaridin, oil of lemon eucalyptus (OLE), or IR3535. ? Higher amounts of DEET or another active ingredient than other repellents have.  If your home windows do not have screens, think about putting some in.  Contact a doctor if:  You have more redness, swelling, or pain in the bite area.  You have fluid, blood, or pus coming from the bite area.  The bite area feels warm.  You have a fever. Get help right away if:  You have joint pain.  You have a rash.  You have shortness of breath.  You feel more tired or sleepy than you normally do.  You have neck pain.  You have a headache.  You feel weaker than you normally do.  You have chest pain.  You have pain in your belly.  You feel sick to your stomach (nauseous) or you throw up (vomit). Summary  An insect bite can make your skin red, itchy, and swollen.  Do not scratch the bite area, and keep it clean and dry.  Ice can help with pain and itching from the bite. This information is   not intended to replace advice given to you by your health care provider. Make sure you discuss any questions you have with your health care provider. Document Released: 06/14/2000 Document Revised: 01/18/2016 Document Reviewed: 11/02/2014 Elsevier Interactive Patient Education  2018 Elsevier Inc.  

## 2017-03-11 NOTE — Progress Notes (Signed)
Ryan [name] 51 y.o.   Chief Complaint  Patient presents with  . Insect Bite    behind right ear/behind neck; states it hurts very bad  . Rash    on neck line/right side    HISTORY OF PRESENT ILLNESS: This is a 51 y.o. male complaining of insect bite to right side of neck, posteriorly 7 days ago c/o pain, redness, and swelling.  HPI   Prior to Admission medications   Medication Sig Start Date End Date Taking? Authorizing Provider  omeprazole (PRILOSEC) 20 MG capsule Take 1 capsule (20 mg total) by mouth 2 (two) times daily before a meal. 02/07/15  Yes Little, Ambrose Finlandachel Morgan, MD  cephALEXin (KEFLEX) 500 MG capsule Take 1 capsule (500 mg total) by mouth 3 (three) times daily. 03/11/17 03/18/17  Ryan QuintSagardia, Domonic Kimball Jose, MD  diclofenac (VOLTAREN) 75 MG EC tablet Take 1 tablet (75 mg total) by mouth 2 (two) times daily. 03/11/17 03/16/17  Ryan QuintSagardia, Renny Remer Jose, MD    No Known Allergies  Patient Active Problem List   Diagnosis Date Noted  . Expected blood loss anemia 02/24/2013  . Overweight (BMI 25.0-29.9) 02/24/2013  . S/P left THA, AA 02/23/2013    Past Medical History:  Diagnosis Date  . Anxiety   . Arthritis    oa  . Chest pain since 02-11-2013   pt thinks associated with reflux  . GERD (gastroesophageal reflux disease)   . H/O hiatal hernia     Past Surgical History:  Procedure Laterality Date  . ANKLE SURGERY Right    reconstruction to muscle  . ESOPHAGEAL MANOMETRY N/A 04/17/2015   Procedure: ESOPHAGEAL MANOMETRY (EM);  Surgeon: Ryan ElizabethJyothi Mann, MD;  Location: [facility];  Service: Endoscopy;  Laterality: N/A;  . ESOPHAGOGASTRODUODENOSCOPY  11/09/2003  . FOOT SURGERY     right  . HIP SURGERY  age 38   left, done x 2  . KNEE ARTHROSCOPY Left 10/20/2003  . MYRINGOTOMY WITH TUBE PLACEMENT Right 08/29/2015   Procedure: MYRINGOTOMY WITH TUBE PLACEMENT;  Surgeon: Ryan PiesSu Teoh, MD;  Location: [facility];  Service: ENT;  Laterality: Right;  . PH IMPEDANCE STUDY N/A  04/17/2015   Procedure: PH IMPEDANCE STUDY;  Surgeon: Ryan ElizabethJyothi Mann, MD;  [facility];  Service: Endoscopy;  Laterality: N/A;  . SEPTOPLASTY N/A 08/29/2015   Procedure: SEPTOPLASTY;  Surgeon: Ryan PiesSu Teoh, MD;  Location: [facility];  Service: ENT;  Laterality: N/A;  . TOTAL HIP ARTHROPLASTY Left 02/23/2013   Procedure: LEFT TOTAL HIP ARTHROPLASTY ANTERIOR APPROACH;  Surgeon: Ryan PalMatthew D Olin, MD;  Location: [facility];  Service: Orthopedics;  Laterality: Left;  . TURBINATE REDUCTION Bilateral 08/29/2015   Procedure: TURBINATE REDUCTION;  Surgeon: Ryan PiesSu Teoh, MD;  Location: [facility];  Service: ENT;  Laterality: Bilateral;  . URETHRA SURGERY   5267yrs ago   for scar tissue    Social History   Social History  . Marital status: Married    Spouse name: N/A  . Number of children: N/A  . Years of education: N/A   Occupational History  . Not on file.   Social History Main Topics  . Smoking status: Former Smoker    Packs/day: 1.50    Years: 25.00    Types: Cigarettes  . Smokeless tobacco: Former NeurosurgeonUser    Quit date: 05/10/2015  . Alcohol use Yes     Comment: ocassionally  . Drug use: No  . Sexual activity: Not on file   Other Topics Concern  .  Not on file   Social History Narrative  . No narrative on file    No family history on file.   Review of Systems  Constitutional: Negative.  Negative for chills and fever.  Respiratory: Negative for cough and shortness of breath.   Cardiovascular: Negative for chest pain and palpitations.  Gastrointestinal: Negative for abdominal pain, nausea and vomiting.  Musculoskeletal: Positive for neck pain.  Skin: Positive for itching and rash.  Neurological: Negative for dizziness, sensory change, focal weakness and headaches.  All other systems reviewed and are negative.   Vitals:   03/11/17 1341  BP: 114/75  Pulse: 65  Temp: 98.4 F (36.9 C)    Physical Exam  Constitutional: He is oriented to person, place, and  time. He appears well-developed and well-nourished.  HENT:  Head: Normocephalic and atraumatic.  Eyes: Pupils are equal, round, and reactive to light. EOM are normal.  Neck: Normal range of motion.  Cardiovascular: Normal rate.   Pulmonary/Chest: Effort normal.  Musculoskeletal: Normal range of motion.  Lymphadenopathy:    He has cervical adenopathy (right sided).  Neurological: He is alert and oriented to person, place, and time.  Skin: Rash (infected insect bites to right posterior neck) noted.  Psychiatric: He has a normal mood and affect. His behavior is normal.  Vitals reviewed.    ASSESSMENT & PLAN: Ryan [name] was seen today for insect bite and rash.  Diagnoses and all orders for this visit:  Cellulitis of neck  Insect bite, initial encounter  Bug bite with infection, initial encounter  Other orders -     cephALEXin (KEFLEX) 500 MG capsule; Take 1 capsule (500 mg total) by mouth 3 (three) times daily. -     diclofenac (VOLTAREN) 75 MG EC tablet; Take 1 tablet (75 mg total) by mouth 2 (two) times daily.    Patient Instructions       IF you received an x-ray today, you will receive an invoice from [facility]. Please contact [facility] at [phone] with questions or concerns regarding your invoice.   IF you received labwork today, you will receive an invoice from [facility]. Please contact [facility] at [phone] with questions or concerns regarding your invoice.   Our billing staff will not be able to assist you with questions regarding bills from these companies.  You will be contacted with the lab results as soon as they are available. The fastest way to get your results is to activate your My Chart account. Instructions are located on the last page of this paperwork. If you have not heard from [place] regarding the results in 2 weeks, please contact this office.      Insect Bite, Adult An insect bite can make your skin red, itchy, and swollen.  Some insects can spread disease to people with a bite. However, most insect bites do not lead to disease, and most are not serious. Follow these instructions at home: Bite area care  Do not scratch the bite area.  Keep the bite area clean and dry.  Wash the bite area every day with soap and water as told by your doctor.  Check the bite area every day for signs of infection. Check for: ? More redness, swelling, or pain. ? Fluid or blood. ? Warmth. ? Pus. Managing pain, itching, and swelling  You may put any of these on the bite area as told by your doctor: ? A baking soda paste. ? Cortisone cream. ? Calamine lotion.  If directed, put ice on  the bite area. ? Put ice in a plastic bag. ? Place a towel between your skin and the bag. ? Leave the ice on for 20 minutes, 2-3 times a day. Medicines  Take medicines or put medicines on your skin only as told by your doctor.  If you were prescribed an antibiotic medicine, use it as told by your doctor. Do not stop using the antibiotic even if your condition improves. General instructions  Keep all follow-up visits as told by your doctor. This is important. How is this prevented? To help you have a lower risk of insect bites:  When you are outside, wear clothing that covers your arms and legs.  Use insect repellent. The best insect repellents have: ? An active ingredient of DEET, picaridin, oil of lemon eucalyptus (OLE), or IR3535. ? Higher amounts of DEET or another active ingredient than other repellents have.  If your home windows do not have screens, think about putting some in.  Contact a doctor if:  You have more redness, swelling, or pain in the bite area.  You have fluid, blood, or pus coming from the bite area.  The bite area feels warm.  You have a fever. Get help right away if:  You have joint pain.  You have a rash.  You have shortness of breath.  You feel more tired or sleepy than you normally do.  You  have neck pain.  You have a headache.  You feel weaker than you normally do.  You have chest pain.  You have pain in your belly.  You feel sick to your stomach (nauseous) or you throw up (vomit). Summary  An insect bite can make your skin red, itchy, and swollen.  Do not scratch the bite area, and keep it clean and dry.  Ice can help with pain and itching from the bite. This information is not intended to replace advice given to you by your health care provider. Make sure you discuss any questions you have with your health care provider. Document Released: 06/14/2000 Document Revised: 01/18/2016 Document Reviewed: 11/02/2014 Elsevier Interactive Patient Education  2018 Elsevier Inc.      Edwina Barth, MD [facility]

## 2017-04-21 DIAGNOSIS — Z471 Aftercare following joint replacement surgery: Secondary | ICD-10-CM | POA: Diagnosis not present

## 2017-04-21 DIAGNOSIS — G5601 Carpal tunnel syndrome, right upper limb: Secondary | ICD-10-CM | POA: Diagnosis not present

## 2017-04-21 DIAGNOSIS — Z96642 Presence of left artificial hip joint: Secondary | ICD-10-CM | POA: Diagnosis not present

## 2017-04-30 ENCOUNTER — Other Ambulatory Visit (HOSPITAL_COMMUNITY): Payer: Self-pay | Admitting: Orthopedic Surgery

## 2017-04-30 DIAGNOSIS — Z471 Aftercare following joint replacement surgery: Secondary | ICD-10-CM

## 2017-04-30 DIAGNOSIS — Z96642 Presence of left artificial hip joint: Principal | ICD-10-CM

## 2017-05-06 ENCOUNTER — Encounter (HOSPITAL_COMMUNITY)
Admission: RE | Admit: 2017-05-06 | Discharge: 2017-05-06 | Disposition: A | Discharge status: Home / Self Care | Payer: BLUE CROSS/BLUE SHIELD | Source: Ambulatory Visit | Attending: Orthopedic Surgery | Admitting: Orthopedic Surgery

## 2017-05-06 DIAGNOSIS — Z96642 Presence of left artificial hip joint: Secondary | ICD-10-CM | POA: Diagnosis not present

## 2017-05-06 DIAGNOSIS — Z471 Aftercare following joint replacement surgery: Secondary | ICD-10-CM | POA: Diagnosis not present

## 2017-05-06 MED ORDER — TECHNETIUM TC 99M MEDRONATE IV KIT
25.0000 | PACK | Freq: Once | INTRAVENOUS | Status: AC | PRN
  Administered 2017-05-06: 25 via INTRAVENOUS

## 2017-05-08 DIAGNOSIS — G5602 Carpal tunnel syndrome, left upper limb: Secondary | ICD-10-CM | POA: Diagnosis not present

## 2017-05-08 DIAGNOSIS — G5601 Carpal tunnel syndrome, right upper limb: Secondary | ICD-10-CM | POA: Diagnosis not present

## 2017-05-28 DIAGNOSIS — G5601 Carpal tunnel syndrome, right upper limb: Secondary | ICD-10-CM | POA: Diagnosis not present

## 2017-06-12 DIAGNOSIS — G5601 Carpal tunnel syndrome, right upper limb: Secondary | ICD-10-CM | POA: Diagnosis not present

## 2017-09-12 DIAGNOSIS — M25521 Pain in right elbow: Secondary | ICD-10-CM | POA: Diagnosis not present

## 2017-09-12 DIAGNOSIS — M79641 Pain in right hand: Secondary | ICD-10-CM | POA: Diagnosis not present

## 2017-12-26 DIAGNOSIS — L720 Epidermal cyst: Secondary | ICD-10-CM | POA: Diagnosis not present

## 2017-12-26 DIAGNOSIS — D2239 Melanocytic nevi of other parts of face: Secondary | ICD-10-CM | POA: Diagnosis not present

## 2018-03-31 DIAGNOSIS — M25561 Pain in right knee: Secondary | ICD-10-CM | POA: Diagnosis not present

## 2018-03-31 DIAGNOSIS — M25461 Effusion, right knee: Secondary | ICD-10-CM | POA: Diagnosis not present

## 2018-04-10 DIAGNOSIS — M25561 Pain in right knee: Secondary | ICD-10-CM | POA: Diagnosis not present

## 2018-04-16 DIAGNOSIS — M25561 Pain in right knee: Secondary | ICD-10-CM | POA: Diagnosis not present

## 2018-04-22 DIAGNOSIS — S83241A Other tear of medial meniscus, current injury, right knee, initial encounter: Secondary | ICD-10-CM | POA: Diagnosis not present

## 2018-04-22 DIAGNOSIS — M94261 Chondromalacia, right knee: Secondary | ICD-10-CM | POA: Diagnosis not present

## 2018-04-22 DIAGNOSIS — Y999 Unspecified external cause status: Secondary | ICD-10-CM | POA: Diagnosis not present

## 2018-04-22 DIAGNOSIS — M2341 Loose body in knee, right knee: Secondary | ICD-10-CM | POA: Diagnosis not present

## 2018-04-22 DIAGNOSIS — X58XXXA Exposure to other specified factors, initial encounter: Secondary | ICD-10-CM | POA: Diagnosis not present

## 2018-05-07 DIAGNOSIS — M1712 Unilateral primary osteoarthritis, left knee: Secondary | ICD-10-CM | POA: Diagnosis not present

## 2018-06-12 DIAGNOSIS — M1712 Unilateral primary osteoarthritis, left knee: Secondary | ICD-10-CM | POA: Diagnosis not present

## 2018-06-12 DIAGNOSIS — M1711 Unilateral primary osteoarthritis, right knee: Secondary | ICD-10-CM | POA: Diagnosis not present

## 2018-06-19 DIAGNOSIS — M1712 Unilateral primary osteoarthritis, left knee: Secondary | ICD-10-CM | POA: Diagnosis not present

## 2018-06-25 DIAGNOSIS — M1712 Unilateral primary osteoarthritis, left knee: Secondary | ICD-10-CM | POA: Diagnosis not present

## 2018-07-20 ENCOUNTER — Other Ambulatory Visit: Payer: Self-pay

## 2018-07-20 ENCOUNTER — Ambulatory Visit (INDEPENDENT_AMBULATORY_CARE_PROVIDER_SITE_OTHER): Payer: BLUE CROSS/BLUE SHIELD | Admitting: Family Medicine

## 2018-07-20 ENCOUNTER — Encounter: Payer: Self-pay | Admitting: Family Medicine

## 2018-07-20 VITALS — BP 115/84 | HR 103 | Temp 98.7°F | Resp 16 | Ht 75.0 in | Wt 247.4 lb

## 2018-07-20 DIAGNOSIS — S6991XS Unspecified injury of right wrist, hand and finger(s), sequela: Secondary | ICD-10-CM

## 2018-07-20 DIAGNOSIS — S6991XA Unspecified injury of right wrist, hand and finger(s), initial encounter: Secondary | ICD-10-CM

## 2018-07-20 DIAGNOSIS — Z23 Encounter for immunization: Secondary | ICD-10-CM | POA: Diagnosis not present

## 2018-07-20 DIAGNOSIS — S61216A Laceration without foreign body of right little finger without damage to nail, initial encounter: Secondary | ICD-10-CM

## 2018-07-20 MED ORDER — CEPHALEXIN 500 MG PO CAPS: 500.0000 mg | ORAL_CAPSULE | Freq: Two times a day (BID) | ORAL | 0 refills | Status: DC

## 2018-07-20 NOTE — Progress Notes (Addendum)
I, [employer], acting as a Neurosurgeon for Shade Flood, MD, have documented all relevant documentation on the behalf of Shade Flood, MD, as directed by Shade Flood, MD while in the presence of Shade Flood, MD.     Subjective:    Patient ID: Ryan [name], male    DOB: 02-Nov-1965, 53 y.o.   MRN: [id number]  PCP: [facility]   Chief Complaint  Patient presents with  . Laceration    right pinky finger DOI 07/19/2018 think maybe finger is getting infected. Went to the Urgent care but left cause waited 2 hour so was not able to get suture, finger still been bleed a little and can not bend without cut come back open     HPI Ryan [name] is a 53 y.o. male who presents to Primary Care at [facility] complaining of laceration on the 5th digit of the right upper extremity on 07/18/2018.   He was washing dishes and broke a glass while his hand was in it. Multiple pieces, but angled piece cut his finger.  Significant bleeding initially, was able to flush with water, and bandage.  Initially try to be seen by urgent care, but did not have provider able to suture area.  Has been trying to keep clean/covered with a splint.  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  Not bleeding at present.  Some swelling around wound but no discharge.  No fevers.  Tdap updated today.   Review of Systems  Constitutional: Negative for fever.  Musculoskeletal: Positive for arthralgias (finger pain).  Skin: Positive for wound (5th right upper digit).     Vitals:   07/20/18 1728  BP: 115/84  Pulse: (!) 103  Resp: 16  Temp: 98.7 F (37.1 C)  TempSrc: Oral  SpO2: 98%  Weight: 247 lb 6.4 oz (112.2 kg)  Height: 6\' 3"  (1.905 m)        Objective:   Physical Exam Constitutional:      General: He is not in acute distress.    Appearance: He is well-developed.  HENT:     Head: Normocephalic  and atraumatic.  Cardiovascular:     Rate and Rhythm: Normal rate.  Pulmonary:     Effort: Pulmonary effort is normal.  Musculoskeletal:     Right hand: He exhibits tenderness (with soft tissue swelling at the base of the laceration. Dorsal and volar non-tender) and laceration (Located on 5th digit at the ulnar aspect of the PIP; Dorsal aspect measures 63mm; Volar aspect measures 1.4 cm). He exhibits no bony tenderness.     Comments: Tender along the ulnar aspect of the PIP with some surrounding bruising. PIP and DIP flexion and extension strength intact. No apparent laxity on collaterals. No bony tenderness. Minimal erythema just below the wound approximately 1 to 2 mm.. No discharge. Cap refill is less than 1 second distally.  Neurological:     Mental Status: He is alert and oriented to person, place, and time.       Assessment & Plan:   Ryan [name] is a 53 y.o. male Finger injury, right, sequela - Plan: Tdap vaccine greater than or equal to 7yo IM, cephALEXin (KEFLEX) 500 MG capsule  Laceration of right little finger without damage to nail, foreign body presence unspecified, initial encounter - Plan: cephALEXin (KEFLEX) 500 MG capsule  Laceration to  finger as above with delayed presentation to current care.  Beyond timing of suturing at this time due to risk of infection.  Hemostatic at this time.  No sign of cellulitis, but will cover with Keflex 500 mg twice daily, cleanse wound with soap and water, keep covered especially at work given his occupation, and can splint or buddy tape to fourth finger if needed to lessen bending.  Tdap updated.  RTC precautions given with understanding expressed.  Handout given  Meds ordered this encounter  Medications  . cephALEXin (KEFLEX) 500 MG capsule    Sig: Take 1 capsule (500 mg total) by mouth 2 (two) times daily.    Dispense:  30 capsule    Refill:  0   Patient Instructions   I do not see any signs of infection at this time, swelling  likely related to initial wound.  Start antibiotic 3 times per day, but watch for redness, discharge, or any worsening pain/swelling.  Return if those occur or to ER>   Try to cleanse over wound twice per day with soap and water, keep covered at work with bulky dressing or glove.   Thank you for coming in today. Return to the clinic or go to the nearest emergency room if any of your symptoms worsen or new symptoms occur.   Laceration Care, Adult A laceration is a cut that may go through all layers of the skin and into the tissue that is right under the skin. Some lacerations heal on their own. Others need to be closed with stitches (sutures), staples, skin adhesive strips, or skin glue. Proper care of a laceration reduces the risk for infection, helps the laceration heal better, and may prevent scarring. How to care for your laceration Wash your hands with soap and water before touching your wound or changing your bandage (dressing). If soap and water are not available, use hand sanitizer. Keep the wound clean and dry. If you were given a dressing, you should change it at least once a day, or as told by your health care provider. You should also change it if it becomes wet or dirty.  General instructions   Take over-the-counter and prescription medicines only as told by your health care provider.  If you were prescribed an antibiotic medicine or ointment, take or apply it as told by your health care provider. Do not stop using it even if your condition improves.  Do not scratch or pick at the wound.  Check your wound every day for signs of infection. Watch for: ? Redness, swelling, or pain. ? Fluid, blood, or pus.  Raise (elevate) the injured area above the level of your heart while you are sitting or lying down for the first 24-48 hours after the laceration is repaired.  If directed, put ice on the affected area: ? Put ice in a plastic bag. ? Place a towel between your skin and the  bag. ? Leave the ice on for 20 minutes, 2-3 times a day.  Keep all follow-up visits as told by your health care provider. This is important. Contact a health care provider if:  You received a tetanus shot and you have swelling, severe pain, redness, or bleeding at the injection site.  You have a fever.  A wound that was closed breaks open.  You notice a bad smell coming from your wound or your dressing.  You notice something coming out of the wound, such as wood or glass.  Your pain is not controlled  with medicine.  You have increased redness, swelling, or pain at the site of your wound.  You have fluid, blood, or pus coming from your wound.  You need to change the dressing often due to fluid, blood, or pus that is draining from the wound.  You develop a new rash.  You develop numbness around the wound. Get help right away if:  You develop severe swelling around the wound.  Your pain suddenly increases and is severe.  You develop painful lumps near the wound or on skin anywhere else on your body.  You have a red streak going away from your wound.  The wound is on your hand or foot and you cannot properly move a finger or toe.  The wound is on your hand or foot, and you notice that your fingers or toes look pale or bluish. Summary  A laceration is a cut that may go through all layers of the skin and into the tissue that is right under the skin.  Some lacerations heal on their own. Others need to be closed with stitches (sutures), staples, skin adhesive strips, or skin glue.  Proper care of a laceration reduces the risk of infection, helps the laceration heal better, and prevents scarring. This information is not intended to replace advice given to you by your health care provider. Make sure you discuss any questions you have with your health care provider. Document Released: 06/17/2005 Document Revised: 07/07/2017 Document Reviewed: 07/07/2017 Elsevier Interactive  Patient Education  [employer]2019 [employer]   If you have lab work done today you will be contacted with your lab results within the next 2 weeks.  If you have not heard from us then please contact us. The fastest way to get your results is to register for My Chart.   IF you received an x-ray today, you will receive an invoice from [facility]. Please contact [facility] at [phone] with questions or concerns regarding your invoice.   IF you received labwork today, you will receive an invoice from [facility]. Please contact [facility] at [phone] with questions or concerns regarding your invoice.   Our billing staff will not be able to assist you with questions regarding bills from these companies.  You will be contacted with the lab results as soon as they are available. The fastest way to get your results is to activate your My Chart account. Instructions are located on the last page of this paperwork. If you have not heard from us regarding the results in 2 weeks, please contact this office.       I personally performed the services described in this documentation, which was scribed in my presence. The recorded information has been reviewed and considered for accuracy and completeness, addended by me as needed, and agree with information above.  Signed,   Meredith StaggersJeffrey Ugochukwu Chichester, MD Primary Care at [facility].  07/21/18 9:27 PM

## 2018-07-20 NOTE — Patient Instructions (Addendum)
I do not see any signs of infection at this time, swelling likely related to initial wound.  Start antibiotic 3 times per day, but watch for redness, discharge, or any worsening pain/swelling.  Return if those occur or to ER>   Try to cleanse over wound twice per day with soap and water, keep covered at work with bulky dressing or glove.   Thank you for coming in today. Return to the clinic or go to the nearest emergency room if any of your symptoms worsen or new symptoms occur.   Laceration Care, Adult A laceration is a cut that may go through all layers of the skin and into the tissue that is right under the skin. Some lacerations heal on their own. Others need to be closed with stitches (sutures), staples, skin adhesive strips, or skin glue. Proper care of a laceration reduces the risk for infection, helps the laceration heal better, and may prevent scarring. How to care for your laceration Wash your hands with soap and water before touching your wound or changing your bandage (dressing). If soap and water are not available, use hand sanitizer. Keep the wound clean and dry. If you were given a dressing, you should change it at least once a day, or as told by your health care provider. You should also change it if it becomes wet or dirty.  General instructions   Take over-the-counter and prescription medicines only as told by your health care provider.  If you were prescribed an antibiotic medicine or ointment, take or apply it as told by your health care provider. Do not stop using it even if your condition improves.  Do not scratch or pick at the wound.  Check your wound every day for signs of infection. Watch for: ? Redness, swelling, or pain. ? Fluid, blood, or pus.  Raise (elevate) the injured area above the level of your heart while you are sitting or lying down for the first 24-48 hours after the laceration is repaired.  If directed, put ice on the affected area: ? Put ice in a  plastic bag. ? Place a towel between your skin and the bag. ? Leave the ice on for 20 minutes, 2-3 times a day.  Keep all follow-up visits as told by your health care provider. This is important. Contact a health care provider if:  You received a tetanus shot and you have swelling, severe pain, redness, or bleeding at the injection site.  You have a fever.  A wound that was closed breaks open.  You notice a bad smell coming from your wound or your dressing.  You notice something coming out of the wound, such as wood or glass.  Your pain is not controlled with medicine.  You have increased redness, swelling, or pain at the site of your wound.  You have fluid, blood, or pus coming from your wound.  You need to change the dressing often due to fluid, blood, or pus that is draining from the wound.  You develop a new rash.  You develop numbness around the wound. Get help right away if:  You develop severe swelling around the wound.  Your pain suddenly increases and is severe.  You develop painful lumps near the wound or on skin anywhere else on your body.  You have a red streak going away from your wound.  The wound is on your hand or foot and you cannot properly move a finger or toe.  The wound is on your  hand or foot, and you notice that your fingers or toes look pale or bluish. Summary  A laceration is a cut that may go through all layers of the skin and into the tissue that is right under the skin.  Some lacerations heal on their own. Others need to be closed with stitches (sutures), staples, skin adhesive strips, or skin glue.  Proper care of a laceration reduces the risk of infection, helps the laceration heal better, and prevents scarring. This information is not intended to replace advice given to you by your health care provider. Make sure you discuss any questions you have with your health care provider. Document Released: 06/17/2005 Document Revised: 07/07/2017  Document Reviewed: 07/07/2017 Elsevier Interactive Patient Education  [employer].   If you have lab work done today you will be contacted with your lab results within the next 2 weeks.  If you have not heard from [place] then please contact us. The fastest way to get your results is to register for My Chart.   IF you received an x-ray today, you will receive an invoice from [facility]. Please contact [facility] at [phone] with questions or concerns regarding your invoice.   IF you received labwork today, you will receive an invoice from [place]. Please contact [facility] at [phone] with questions or concerns regarding your invoice.   Our billing staff will not be able to assist you with questions regarding bills from these companies.  You will be contacted with the lab results as soon as they are available. The fastest way to get your results is to activate your My Chart account. Instructions are located on the last page of this paperwork. If you have not heard from [place] regarding the results in 2 weeks, please contact this office.

## 2018-07-27 ENCOUNTER — Encounter (HOSPITAL_COMMUNITY): Payer: Self-pay | Admitting: Emergency Medicine

## 2018-07-27 ENCOUNTER — Other Ambulatory Visit: Payer: Self-pay

## 2018-07-27 ENCOUNTER — Emergency Department (HOSPITAL_COMMUNITY): Payer: BLUE CROSS/BLUE SHIELD

## 2018-07-27 ENCOUNTER — Emergency Department (HOSPITAL_COMMUNITY)
Admission: EM | Admit: 2018-07-27 | Discharge: 2018-07-27 | Disposition: A | Discharge status: Home / Self Care | Payer: BLUE CROSS/BLUE SHIELD | Attending: Emergency Medicine | Admitting: Emergency Medicine

## 2018-07-27 DIAGNOSIS — R109 Unspecified abdominal pain: Secondary | ICD-10-CM | POA: Diagnosis not present

## 2018-07-27 DIAGNOSIS — K219 Gastro-esophageal reflux disease without esophagitis: Secondary | ICD-10-CM

## 2018-07-27 DIAGNOSIS — K5900 Constipation, unspecified: Secondary | ICD-10-CM | POA: Insufficient documentation

## 2018-07-27 DIAGNOSIS — Z87891 Personal history of nicotine dependence: Secondary | ICD-10-CM | POA: Insufficient documentation

## 2018-07-27 DIAGNOSIS — Z79899 Other long term (current) drug therapy: Secondary | ICD-10-CM | POA: Insufficient documentation

## 2018-07-27 DIAGNOSIS — R0602 Shortness of breath: Secondary | ICD-10-CM | POA: Diagnosis not present

## 2018-07-27 DIAGNOSIS — R079 Chest pain, unspecified: Secondary | ICD-10-CM | POA: Diagnosis not present

## 2018-07-27 LAB — URINALYSIS, ROUTINE W REFLEX MICROSCOPIC
Bilirubin Urine: NEGATIVE
Glucose, UA: NEGATIVE mg/dL
Hgb urine dipstick: NEGATIVE
Ketones, ur: NEGATIVE mg/dL
Leukocytes, UA: NEGATIVE
Nitrite: NEGATIVE
Protein, ur: NEGATIVE mg/dL
Specific Gravity, Urine: 1.008 (ref 1.005–1.030)
pH: 5 (ref 5.0–8.0)

## 2018-07-27 LAB — CBC
HCT: 40.8 % (ref 39.0–52.0)
Hemoglobin: 13.9 g/dL (ref 13.0–17.0)
MCH: 33.1 pg (ref 26.0–34.0)
MCHC: 34.1 g/dL (ref 30.0–36.0)
MCV: 97.1 fL (ref 80.0–100.0)
NRBC: 0 % (ref 0.0–0.2)
Platelets: 239 10*3/uL (ref 150–400)
RBC: 4.2 MIL/uL — ABNORMAL LOW (ref 4.22–5.81)
RDW: 12.8 % (ref 11.5–15.5)
WBC: 8.5 10*3/uL (ref 4.0–10.5)

## 2018-07-27 LAB — COMPREHENSIVE METABOLIC PANEL
ALT: 27 U/L (ref 0–44)
AST: 20 U/L (ref 15–41)
Albumin: 4.1 g/dL (ref 3.5–5.0)
Alkaline Phosphatase: 61 U/L (ref 38–126)
Anion gap: 11 (ref 5–15)
BUN: 16 mg/dL (ref 6–20)
CHLORIDE: 105 mmol/L (ref 98–111)
CO2: 21 mmol/L — ABNORMAL LOW (ref 22–32)
Calcium: 8.9 mg/dL (ref 8.9–10.3)
Creatinine, Ser: 0.97 mg/dL (ref 0.61–1.24)
GFR calc Af Amer: >60 mL/min (ref >60)
GFR calc non Af Amer: >60 mL/min (ref >60)
Glucose, Bld: 121 mg/dL — ABNORMAL HIGH (ref 70–99)
POTASSIUM: 3.5 mmol/L (ref 3.5–5.1)
Sodium: 137 mmol/L (ref 135–145)
Total Bilirubin: 0.5 mg/dL (ref 0.3–1.2)
Total Protein: 6.4 g/dL — ABNORMAL LOW (ref 6.5–8.1)

## 2018-07-27 LAB — LIPASE, BLOOD: Lipase: 55 U/L — ABNORMAL HIGH (ref 11–51)

## 2018-07-27 LAB — I-STAT TROPONIN, ED: Troponin i, poc: 0 ng/mL (ref 0.00–0.08)

## 2018-07-27 MED ORDER — SODIUM CHLORIDE 0.9% FLUSH: 3.0000 mL | Freq: Once | INTRAVENOUS | Status: DC

## 2018-07-27 MED ORDER — DICYCLOMINE HCL 20 MG PO TABS: 20.0000 mg | ORAL_TABLET | Freq: Three times a day (TID) | ORAL | 0 refills | Status: DC | PRN

## 2018-07-27 MED ORDER — DICYCLOMINE HCL 10 MG/ML IM SOLN
20.0000 mg | Freq: Once | INTRAMUSCULAR | Status: AC
  Administered 2018-07-27: 20 mg via INTRAMUSCULAR
  Filled 2018-07-27: qty 2

## 2018-07-27 MED ORDER — ONDANSETRON 4 MG PO TBDP
4.0000 mg | ORAL_TABLET | Freq: Once | ORAL | Status: AC
  Administered 2018-07-27: 4 mg via ORAL
  Filled 2018-07-27: qty 1

## 2018-07-27 MED ORDER — ALUM & MAG HYDROXIDE-SIMETH 200-200-20 MG/5ML PO SUSP
30.0000 mL | Freq: Once | ORAL | Status: AC
  Administered 2018-07-27: 30 mL via ORAL
  Filled 2018-07-27: qty 30

## 2018-07-27 MED ORDER — ONDANSETRON 4 MG PO TBDP: 4.0000 mg | ORAL_TABLET | Freq: Four times a day (QID) | ORAL | 0 refills | Status: DC | PRN

## 2018-07-27 MED ORDER — LIDOCAINE VISCOUS HCL 2 % MT SOLN
15.0000 mL | Freq: Once | OROMUCOSAL | Status: AC
  Administered 2018-07-27: 15 mL via ORAL
  Filled 2018-07-27: qty 15

## 2018-07-27 MED ORDER — PANTOPRAZOLE SODIUM 40 MG PO TBEC: 40.0000 mg | DELAYED_RELEASE_TABLET | Freq: Every day | ORAL | 1 refills | Status: DC

## 2018-07-27 MED ORDER — POLYETHYLENE GLYCOL 3350 17 G PO PACK
17.0000 g | PACK | Freq: Once | ORAL | Status: AC
  Administered 2018-07-27: 17 g via ORAL
  Filled 2018-07-27: qty 1

## 2018-07-27 NOTE — ED Triage Notes (Addendum)
C/o heartburn, pain across chest, and lower abd pain.  States symptoms started on Monday and have gotten worse.  Last normal BM Friday morning.  Reports constipation since then and constipation for 4 days prior to then.  Reports nausea.  Denies vomiting.  Woke up at 2am with pain across chest and SOB.

## 2018-07-27 NOTE — Discharge Instructions (Addendum)
Please avoid NSAIDs such as aspirin (Goody powders), ibuprofen (Motrin, Advil), naproxen (Aleve) as these may worsen your symptoms.  Tylenol 1000 mg every 6 hours is safe to take as long as you have no history of liver problems (heavy alcohol use, cirrhosis, hepatitis).  Please avoid spicy, acidic (citrus fruits, tomato based sauces, salsa), greasy, fatty foods.  Please avoid caffeine and alcohol.  You may use over-the-counter medication such as Tums, Mylanta or Maalox, Pepcid or Zantac to help with heartburn.  I recommend that you take Protonix every day.   I recommend that you increase your water and fiber intake. If you are not able to eat foods high in fiber, you may use Benefiber or Metamucil over-the-counter. I also recommend you use MiraLAX 1-2 times a day and Colace 100 mg twice a day to help with bowel movements. These medications are over the counter.  You may use other over-the-counter medications such as Dulcolax, Fleet enemas, magnesium citrate as needed for constipation. Please note that some of these medications may cause you to have abdominal cramping which is normal. If you develop severe abdominal pain, fever (temperature of 100.4 or higher), persistent vomiting, distention of your abdomen, unable to have a bowel movement for 5 days or are not passing gas, please return to the hospital.

## 2018-07-27 NOTE — ED Provider Notes (Signed)
TIME SEEN: 5:22 AM  CHIEF COMPLAINT: Constipation, heartburn  HPI: Patient is a 53 year old male with history of GERD, hiatal hernia who presents to the emergency department with constipation and heartburn.  States he has been experiencing this intermittently all week.  Reports that is causing him to have burning chest pain and belching.  He is tried over-the-counter Mylanta, PPI without relief.  Has had nausea and decreased p.o. intake but no vomiting or diarrhea.  States that he had normal bowel movement on Monday the 20th and then again on Friday the 25th.  He has felt constipated in between.  He has not tried any medications over-the-counter for constipation.  He is passing gas.  No history of abdominal surgery or bowel obstruction.  States he feels full and feels like this is making his heartburn worse.  ROS: See HPI Constitutional: no fever  Eyes: no drainage  ENT: no runny nose   Cardiovascular:  chest pain  Resp: no SOB  GI: no vomiting GU: no dysuria Integumentary: no rash  Allergy: no hives  Musculoskeletal: no leg swelling  Neurological: no slurred speech ROS otherwise negative  PAST MEDICAL HISTORY/PAST SURGICAL HISTORY:  Past Medical History:  Diagnosis Date  . Anxiety   . Arthritis    oa  . Chest pain since 02-11-2013   pt thinks associated with reflux  . GERD (gastroesophageal reflux disease)   . H/O hiatal hernia     MEDICATIONS:  Prior to Admission medications   Medication Sig Start Date End Date Taking? Authorizing Provider  cephALEXin (KEFLEX) 500 MG capsule Take 1 capsule (500 mg total) by mouth 2 (two) times daily. 07/20/18   Shade Flood, MD  esomeprazole (NEXIUM) 20 MG capsule Take 20 mg by mouth 2 (two) times daily.  02/07/15  [provider]    ALLERGIES:  No Known Allergies  SOCIAL HISTORY:  Social History   Tobacco Use  . Smoking status: Former Smoker    Packs/day: 1.50    Years: 25.00    Pack years: 37.50    Types: Cigarettes  .  Smokeless tobacco: Former Neurosurgeon    Quit date: 05/10/2015  Substance Use Topics  . Alcohol use: Yes    Comment: ocassionally    FAMILY HISTORY: No family history on file.  EXAM: BP 138/85 (BP Location: Right Arm)   Pulse 79   Temp 97.9 F (36.6 C) (Oral)   Resp 17   Ht 6\' 3"  (1.905 m)   Wt 112.5 kg   SpO2 99%   BMI 31.00 kg/m  CONSTITUTIONAL: Alert and oriented and responds appropriately to questions. Well-appearing; well-nourished HEAD: Normocephalic EYES: Conjunctivae clear, pupils appear equal, EOMI ENT: normal nose; moist mucous membranes NECK: Supple, no meningismus, no nuchal rigidity, no LAD  CARD: RRR; S1 and S2 appreciated; no murmurs, no clicks, no rubs, no gallops RESP: Normal chest excursion without splinting or tachypnea; breath sounds clear and equal bilaterally; no wheezes, no rhonchi, no rales, no hypoxia or respiratory distress, speaking full sentences ABD/GI: Normal bowel sounds; non-distended; soft, mildly tender throughout the abdomen, no rebound, no guarding, no peritoneal signs, no hepatosplenomegaly BACK:  The back appears normal and is non-tender to palpation, there is no CVA tenderness EXT: Normal ROM in all joints; non-tender to palpation; no edema; normal capillary refill; no cyanosis, no calf tenderness or swelling    SKIN: Normal color for age and race; warm; no rash NEURO: Moves all extremities equally PSYCH: The patient's mood and manner are appropriate. Grooming  and personal hygiene are appropriate.  MEDICAL DECISION MAKING: Patient here with complaints of heartburn and constipation.  Abdominal exam is benign.  Labs are reassuring.  Troponin is negative.  Chest x-ray clear.  I doubt this is ACS, PE or dissection.  He has not tried any over-the-counter medications to help with constipation.  I have offered to perform rectal exam to evaluate for possible fecal impaction and offered enema here but he declines.  Patient seems very frustrated.  He states "I  just want to know why I am constipated".  Discussed with patient that there can be multiple reasons for constipation today.  He denies any opiate use.  Discussed with patient that this would not be an emergent work-up but he can follow-up with a gastroenterologist if this continues.  I recommended over-the-counter medications for symptomatic relief and increased water intake and fiber.  Have offered medications here for symptomatic relief for his heartburn and constipation.  Discussed giving these medications and then reevaluating patient but he states "you can just give me the shot and then I will go home".  He does not want to stay to see if these medications help improve his symptoms.  I do not feel he needs emergent imaging of his abdomen today.  Doubt cholecystitis, pancreatitis, bowel obstruction, appendicitis, volvulus, colitis, diverticulitis based on his benign exam.   At this time, I do not feel there is any life-threatening condition present. I have reviewed and discussed all results (EKG, imaging, lab, urine as appropriate) and exam findings with patient/family. I have reviewed nursing notes and appropriate previous records.  I feel the patient is safe to be discharged home without further emergent workup and can continue workup as an outpatient as needed. Discussed usual and customary return precautions. Patient/family verbalize understanding and are comfortable with this plan.  Outpatient follow-up has been provided as needed. All questions have been answered.     Earline Stiner, Layla MawKristen N, DO 07/27/18 [phone]

## 2018-07-28 DIAGNOSIS — M1711 Unilateral primary osteoarthritis, right knee: Secondary | ICD-10-CM | POA: Diagnosis not present

## 2018-07-29 DIAGNOSIS — K5900 Constipation, unspecified: Secondary | ICD-10-CM | POA: Diagnosis not present

## 2018-07-29 DIAGNOSIS — Z1211 Encounter for screening for malignant neoplasm of colon: Secondary | ICD-10-CM | POA: Diagnosis not present

## 2018-07-29 DIAGNOSIS — K219 Gastro-esophageal reflux disease without esophagitis: Secondary | ICD-10-CM | POA: Diagnosis not present

## 2018-08-03 DIAGNOSIS — M1711 Unilateral primary osteoarthritis, right knee: Secondary | ICD-10-CM | POA: Diagnosis not present

## 2018-08-10 DIAGNOSIS — M1712 Unilateral primary osteoarthritis, left knee: Secondary | ICD-10-CM | POA: Diagnosis not present

## 2018-08-18 DIAGNOSIS — D122 Benign neoplasm of ascending colon: Secondary | ICD-10-CM | POA: Diagnosis not present

## 2018-08-18 DIAGNOSIS — K2950 Unspecified chronic gastritis without bleeding: Secondary | ICD-10-CM | POA: Diagnosis not present

## 2018-08-18 DIAGNOSIS — R0789 Other chest pain: Secondary | ICD-10-CM | POA: Diagnosis not present

## 2018-08-18 DIAGNOSIS — K635 Polyp of colon: Secondary | ICD-10-CM | POA: Diagnosis not present

## 2018-08-18 DIAGNOSIS — R12 Heartburn: Secondary | ICD-10-CM | POA: Diagnosis not present

## 2018-08-18 DIAGNOSIS — K319 Disease of stomach and duodenum, unspecified: Secondary | ICD-10-CM | POA: Diagnosis not present

## 2018-08-18 DIAGNOSIS — D120 Benign neoplasm of cecum: Secondary | ICD-10-CM | POA: Diagnosis not present

## 2018-08-18 DIAGNOSIS — D125 Benign neoplasm of sigmoid colon: Secondary | ICD-10-CM | POA: Diagnosis not present

## 2018-08-18 DIAGNOSIS — K219 Gastro-esophageal reflux disease without esophagitis: Secondary | ICD-10-CM | POA: Diagnosis not present

## 2018-08-18 DIAGNOSIS — Z1211 Encounter for screening for malignant neoplasm of colon: Secondary | ICD-10-CM | POA: Diagnosis not present

## 2018-08-18 DIAGNOSIS — R079 Chest pain, unspecified: Secondary | ICD-10-CM | POA: Diagnosis not present

## 2018-08-19 ENCOUNTER — Other Ambulatory Visit: Payer: Self-pay | Admitting: Gastroenterology

## 2018-08-19 ENCOUNTER — Other Ambulatory Visit: Payer: Self-pay

## 2018-08-19 DIAGNOSIS — R1010 Upper abdominal pain, unspecified: Secondary | ICD-10-CM

## 2018-08-25 ENCOUNTER — Ambulatory Visit
Admission: RE | Admit: 2018-08-25 | Discharge: 2018-08-25 | Disposition: A | Discharge status: Home / Self Care | Payer: BLUE CROSS/BLUE SHIELD | Source: Ambulatory Visit | Attending: Gastroenterology | Admitting: Gastroenterology

## 2018-08-25 DIAGNOSIS — R1010 Upper abdominal pain, unspecified: Secondary | ICD-10-CM

## 2018-08-25 DIAGNOSIS — K760 Fatty (change of) liver, not elsewhere classified: Secondary | ICD-10-CM | POA: Diagnosis not present

## 2018-09-15 DIAGNOSIS — Z01818 Encounter for other preprocedural examination: Secondary | ICD-10-CM | POA: Diagnosis not present

## 2018-09-15 DIAGNOSIS — R7301 Impaired fasting glucose: Secondary | ICD-10-CM | POA: Diagnosis not present

## 2018-09-15 DIAGNOSIS — R7309 Other abnormal glucose: Secondary | ICD-10-CM | POA: Diagnosis not present

## 2018-09-22 DIAGNOSIS — M25561 Pain in right knee: Secondary | ICD-10-CM | POA: Diagnosis not present

## 2018-10-14 ENCOUNTER — Inpatient Hospital Stay: Admit: 2018-10-14 | Payer: BLUE CROSS/BLUE SHIELD | Admitting: Orthopedic Surgery

## 2018-10-14 SURGERY — ARTHROPLASTY, KNEE, TOTAL
Anesthesia: Choice | Laterality: Right

## 2018-11-04 ENCOUNTER — Other Ambulatory Visit (HOSPITAL_COMMUNITY): Payer: Self-pay | Admitting: *Deleted

## 2018-11-04 NOTE — Progress Notes (Unsigned)
MEDICAL CLEARANCE Grace Bushy FNP 09-15-18 EPIC

## 2018-11-04 NOTE — Patient Instructions (Addendum)
Ryan [name]     Your procedure is scheduled on: 11-11-2018  Report to [facility] Main  Entrance  Report to admitting at  615 AM    GO TO [facility] ENTRANCE FOR A DRIVE UP COVID 19 TEST BETWEEN 1100 AM AND  300 PM on 11-06-2018   Call this number if you have problems the morning of surgery [phone]   Remember: . BRUSH YOUR TEETH MORNING OF SURGERY AND RINSE YOUR MOUTH OUT, NO CHEWING GUM CANDY  OR MINTS.   NO SOLID FOOD AFTER MIDNIGHT THE NIGHT PRIOR TO SURGERY. NOTHING BY MOUTH EXCEPT CLEAR LIQUIDS UNTIL 430 AM. . PLEASE FINISH ENSURE DRINK PER SURGEON ORDER 3 HOURS PRIOR TO SCHEDULED SURGERY TIME WHICH NEEDS TO BE COMPLETED AT 430 AM. NOTHING BY MOUTH AFTER 430 AM.    CLEAR LIQUID DIET   Foods Allowed                                                                     Foods Excluded  Coffee and tea, regular and decaf                             liquids that you cannot  Plain Jell-O in any flavor                                             see through such as: Fruit ices (not with fruit pulp)                                     milk, soups, orange juice  Iced Popsicles                                    All solid food Carbonated beverages, regular and diet                                    Cranberry, grape and apple juices Sports drinks like Gatorade Lightly seasoned clear broth or consume(fat free) Sugar, honey syrup  Sample Menu Breakfast                                Lunch                                     Supper Cranberry juice                    Beef broth                            Chicken  broth Jell-O                                     Grape juice                           Apple juice Coffee or tea                        Jell-O                                      Popsicle                                                Coffee or tea                        Coffee or  tea  _____________________________________________________________________   Take these medicines the morning of surgery with A SIP OF WATER: PANTAPRAZOLE (PROTONIX)                                You may not have any metal on your body including hair pins and              piercings  Do not wear jewelry, make-up, lotions, powders or perfumes, deodorant             Do not wear nail polish.  Do not shave  48 hours prior to surgery.              Men may shave face and neck.   Do not bring valuables to the hospital. [facility] IS NOT             RESPONSIBLE   FOR VALUABLES.  Contacts, dentures or bridgework may not be worn into surgery.  Leave suitcase in the car. After surgery it may be brought to your room.    _____________________________________________________________________             [facility] Before surgery, you can play an important role.  Because skin is not sterile, your skin needs to be as free of germs as possible.  You can reduce the number of germs on your skin by washing with CHG (chlorahexidine gluconate) soap before surgery.  CHG is an antiseptic cleaner which kills germs and bonds with the skin to continue killing germs even after washing. Please DO NOT use if you have an allergy to CHG or antibacterial soaps.  If your skin becomes reddened/irritated stop using the CHG and inform your nurse when you arrive at Short Stay. Do not shave (including legs and underarms) for at least 48 hours prior to the first CHG shower.  You may shave your face/neck. Please follow these instructions carefully:  1.  Shower with CHG Soap the night before surgery and the  morning of Surgery.  2.  If you choose to wash your hair, wash your hair first as usual with your  normal  shampoo.  3.  After you shampoo, rinse your hair and body  thoroughly to remove the  shampoo.                           4.  Use CHG as you would any other liquid soap.  You can apply chg  directly  to the skin and wash                       Gently with a scrungie or clean washcloth.  5.  Apply the CHG Soap to your body ONLY FROM THE NECK DOWN.   Do not use on face/ open                           Wound or open sores. Avoid contact with eyes, ears mouth and genitals (private parts).                       Wash face,  Genitals (private parts) with your normal soap.             6.  Wash thoroughly, paying special attention to the area where your surgery  will be performed.  7.  Thoroughly rinse your body with warm water from the neck down.  8.  DO NOT shower/wash with your normal soap after using and rinsing off  the CHG Soap.                9.  Pat yourself dry with a clean towel.            10.  Wear clean pajamas.            11.  Place clean sheets on your bed the night of your first shower and do not  sleep with pets. Day of Surgery : Do not apply any lotions/deodorants the morning of surgery.  Please wear clean clothes to the hospital/surgery center.  FAILURE TO FOLLOW THESE INSTRUCTIONS MAY RESULT IN THE CANCELLATION OF YOUR SURGERY PATIENT SIGNATURE_________________________________  NURSE SIGNATURE__________________________________  ________________________________________________________________________   Ryan [name]  An incentive spirometer is a tool that can help keep your lungs clear and active. This tool measures how well you are filling your lungs with each breath. Taking long deep breaths may help reverse or decrease the chance of developing breathing (pulmonary) problems (especially infection) following:  A long period of time when you are unable to move or be active. BEFORE THE PROCEDURE   If the spirometer includes an indicator to show your best effort, your nurse or respiratory therapist will set it to a desired goal.  If possible, sit up straight or lean slightly forward. Try not to slouch.  Hold the incentive spirometer in an upright  position. INSTRUCTIONS FOR USE  1. Sit on the edge of your bed if possible, or sit up as far as you can in bed or on a chair. 2. Hold the incentive spirometer in an upright position. 3. Breathe out normally. 4. Place the mouthpiece in your mouth and seal your lips tightly around it. 5. Breathe in slowly and as deeply as possible, raising the piston or the ball toward the top of the column. 6. Hold your breath for 3-5 seconds or for as long as possible. Allow the piston or ball to fall to the bottom of the column. 7. Remove the mouthpiece from your mouth and breathe out normally. 8. Rest for a few seconds and repeat Steps 1 through  7 at least 10 times every 1-2 hours when you are awake. Take your time and take a few normal breaths between deep breaths. 9. The spirometer may include an indicator to show your best effort. Use the indicator as a goal to work toward during each repetition. 10. After each set of 10 deep breaths, practice coughing to be sure your lungs are clear. If you have an incision (the cut made at the time of surgery), support your incision when coughing by placing a pillow or rolled up towels firmly against it. Once you are able to get out of bed, walk around indoors and cough well. You may stop using the incentive spirometer when instructed by your caregiver.  RISKS AND COMPLICATIONS  Take your time so you do not get dizzy or light-headed.  If you are in pain, you may need to take or ask for pain medication before doing incentive spirometry. It is harder to take a deep breath if you are having pain. AFTER USE  Rest and breathe slowly and easily.  It can be helpful to keep track of a log of your progress. Your caregiver can provide you with a simple table to help with this. If you are using the spirometer at home, follow these instructions: [place] IF:   You are having difficultly using the spirometer.  You have trouble using the spirometer as often as  instructed.  Your pain medication is not giving enough relief while using the spirometer.  You develop fever of 100.5 F (38.1 C) or higher. SEEK IMMEDIATE MEDICAL CARE IF:   You cough up bloody sputum that had not been present before.  You develop fever of 102 F (38.9 C) or greater.  You develop worsening pain at or near the incision site. MAKE SURE YOU:   Understand these instructions.  Will watch your condition.  Will get help right away if you are not doing well or get worse. Document Released: 10/28/2006 Document Revised: 09/09/2011 Document Reviewed: 12/29/2006 Exit[employer] Patient Information 2014 [employer], [place].   ________________________________________________________________________  WHAT IS A BLOOD TRANSFUSION? Blood Transfusion Information  A transfusion is the replacement of blood or some of its parts. Blood is made up of multiple cells which provide different functions.  Red blood cells carry oxygen and are used for blood loss replacement.  White blood cells fight against infection.  Platelets control bleeding.  Plasma helps clot blood.  Other blood products are available for specialized needs, such as hemophilia or other clotting disorders. BEFORE THE TRANSFUSION  Who gives blood for transfusions?   Healthy volunteers who are fully evaluated to make sure their blood is safe. This is blood bank blood. Transfusion therapy is the safest it has ever been in the practice of medicine. Before blood is taken from a donor, a complete history is taken to make sure that person has no history of diseases nor engages in risky social behavior (examples are intravenous drug use or sexual activity with multiple partners). The donor's travel history is screened to minimize risk of transmitting infections, such as malaria. The donated blood is tested for signs of infectious diseases, such as HIV and hepatitis. The blood is then tested to be sure it is compatible with you in  order to minimize the chance of a transfusion reaction. If you or a relative donates blood, this is often done in anticipation of surgery and is not appropriate for emergency situations. It takes many days to process the donated blood. RISKS AND COMPLICATIONS  Although transfusion therapy is very safe and saves many lives, the main dangers of transfusion include:   Getting an infectious disease.  Developing a transfusion reaction. This is an allergic reaction to something in the blood you were given. Every precaution is taken to prevent this. The decision to have a blood transfusion has been considered carefully by your caregiver before blood is given. Blood is not given unless the benefits outweigh the risks. AFTER THE TRANSFUSION  Right after receiving a blood transfusion, you will usually feel much better and more energetic. This is especially true if your red blood cells have gotten low (anemic). The transfusion raises the level of the red blood cells which carry oxygen, and this usually causes an energy increase.  The nurse administering the transfusion will monitor you carefully for complications. HOME CARE INSTRUCTIONS  No special instructions are needed after a transfusion. You may find your energy is better. Speak with your caregiver about any limitations on activity for underlying diseases you may have. SEEK MEDICAL CARE IF:   Your condition is not improving after your transfusion.  You develop redness or irritation at the intravenous (IV) site. SEEK IMMEDIATE MEDICAL CARE IF:  Any of the following symptoms occur over the next 12 hours:  Shaking chills.  You have a temperature by mouth above 102 F (38.9 C), not controlled by medicine.  Chest, back, or muscle pain.  People around you feel you are not acting correctly or are confused.  Shortness of breath or difficulty breathing.  Dizziness and fainting.  You get a rash or develop hives.  You have a decrease in urine  output.  Your urine turns a dark color or changes to pink, red, or brown. Any of the following symptoms occur over the next 10 days:  You have a temperature by mouth above 102 F (38.9 C), not controlled by medicine.  Shortness of breath.  Weakness after normal activity.  The white part of the eye turns yellow (jaundice).  You have a decrease in the amount of urine or are urinating less often.  Your urine turns a dark color or changes to pink, red, or brown. Document Released: 06/14/2000 Document Revised: 09/09/2011 Document Reviewed: 02/01/2008 [facility] Patient Information 2014 [place], [place].  _______________________________________________________________________

## 2018-11-05 ENCOUNTER — Encounter (HOSPITAL_COMMUNITY)
Admission: RE | Admit: 2018-11-05 | Discharge: 2018-11-05 | Disposition: A | Discharge status: Home / Self Care | Payer: BLUE CROSS/BLUE SHIELD | Source: Ambulatory Visit | Attending: Orthopedic Surgery | Admitting: Orthopedic Surgery

## 2018-11-05 ENCOUNTER — Inpatient Hospital Stay (HOSPITAL_COMMUNITY): Admission: RE | Admit: 2018-11-05 | Payer: BLUE CROSS/BLUE SHIELD | Source: Ambulatory Visit

## 2018-11-05 ENCOUNTER — Encounter (HOSPITAL_COMMUNITY): Payer: Self-pay

## 2018-11-05 ENCOUNTER — Other Ambulatory Visit: Payer: Self-pay

## 2018-11-05 DIAGNOSIS — Z01818 Encounter for other preprocedural examination: Secondary | ICD-10-CM | POA: Diagnosis not present

## 2018-11-05 HISTORY — DX: Dyspnea, unspecified: R06.00

## 2018-11-05 LAB — COMPREHENSIVE METABOLIC PANEL
ALT: 33 U/L (ref 0–44)
AST: 22 U/L (ref 15–41)
Albumin: 4.7 g/dL (ref 3.5–5.0)
Alkaline Phosphatase: 60 U/L (ref 38–126)
Anion gap: 5 (ref 5–15)
BUN: 18 mg/dL (ref 6–20)
CO2: 25 mmol/L (ref 22–32)
Calcium: 9.6 mg/dL (ref 8.9–10.3)
Chloride: 111 mmol/L (ref 98–111)
Creatinine, Ser: 1.02 mg/dL (ref 0.61–1.24)
GFR calc Af Amer: >60 mL/min (ref >60)
GFR calc non Af Amer: >60 mL/min (ref >60)
Glucose, Bld: 94 mg/dL (ref 70–99)
Potassium: 4.3 mmol/L (ref 3.5–5.1)
Sodium: 141 mmol/L (ref 135–145)
Total Bilirubin: 0.6 mg/dL (ref 0.3–1.2)
Total Protein: 7.5 g/dL (ref 6.5–8.1)

## 2018-11-05 LAB — CBC WITH DIFFERENTIAL/PLATELET
Abs Immature Granulocytes: 0.08 10*3/uL — ABNORMAL HIGH (ref 0.00–0.07)
Basophils Absolute: 0.1 10*3/uL (ref 0.0–0.1)
Basophils Relative: 1 %
Eosinophils Absolute: 0.1 10*3/uL (ref 0.0–0.5)
Eosinophils Relative: 1 %
HCT: 42.2 % (ref 39.0–52.0)
Hemoglobin: 13.8 g/dL (ref 13.0–17.0)
Immature Granulocytes: 1 %
Lymphocytes Relative: 22 %
Lymphs Abs: 2.3 10*3/uL (ref 0.7–4.0)
MCH: 32.4 pg (ref 26.0–34.0)
MCHC: 32.7 g/dL (ref 30.0–36.0)
MCV: 99.1 fL (ref 80.0–100.0)
Monocytes Absolute: 1.5 10*3/uL — ABNORMAL HIGH (ref 0.1–1.0)
Monocytes Relative: 14 %
Neutro Abs: 6.6 10*3/uL (ref 1.7–7.7)
Neutrophils Relative %: 61 %
Platelets: 265 10*3/uL (ref 150–400)
RBC: 4.26 MIL/uL (ref 4.22–5.81)
RDW: 12.6 % (ref 11.5–15.5)
WBC: 10.7 10*3/uL — ABNORMAL HIGH (ref 4.0–10.5)
nRBC: 0 % (ref 0.0–0.2)

## 2018-11-05 LAB — SURGICAL PCR SCREEN
MRSA, PCR: NEGATIVE
Staphylococcus aureus: NEGATIVE

## 2018-11-05 LAB — PROTIME-INR
INR: 1 (ref 0.8–1.2)
Prothrombin Time: 13.1 seconds (ref 11.4–15.2)

## 2018-11-05 LAB — APTT: aPTT: 23 seconds — ABNORMAL LOW (ref 24–36)

## 2018-11-05 NOTE — Progress Notes (Addendum)
Anesthesia Chart Review   Case:  [id number] Date/Time:  11/11/18 0830   Procedure:  TOTAL KNEE ARTHROPLASTY (Right ) -   Anesthesia type:  Choice   Pre-op diagnosis:  right knee osteoarthritis   Location:  [facility] ROOM 08 / [facility]   Surgeon:  [name], MD      DISCUSSION:53 yo former smoker (37.5 pack years) with h/o GERD, hiatal hernia, anxiety, a-fib, right knee OA scheduled for above procedure 11/11/18 with Dr. [name].    Pt seen in the ED 07/2018.  EKG with new A-fib at this time, rate 92bpm.  Kristen Ward, DO commented that this EKG was normal, no follow up regarding new onset A-fib. He was seen by Grace Bushy, FNP 09/15/2018 for pre-op clearance who also commented that EKG was normal.  Pt is asymptomatic at PAT visit.  Called Grace Bushy who cleared him for surgery to make her aware of new onset A-fib (she reports she did not see the tracing) and need for evaluation prior to surgery.  She reports she will contact patient for evaluation/appointment prior to surgery.  Discussed with Dr. Acey Lav who agrees with plan.     Surgeon made aware.   Addendum 11/06/2018:  Pt was seen by PCP today.  He was started on atenolol.  Referral made to cardiologist and sleep medicine.  Pt remains asymptomatic.  Per PCP note plan was discussed with Dr. Jeannetta Ellis PA.  VS: BP 107/77 (BP Location: Right Arm)   Pulse 64   Temp 36.8 C (Oral)   Resp 18   Ht 6\' 3"  (1.905 m)   Wt 108.6 kg   SpO2 98%   BMI 29.94 kg/m   PROVIDERS: [facility]   LABS: Labs reviewed: Acceptable for surgery. (all labs ordered are listed, but only abnormal results are displayed)  Labs Reviewed  SURGICAL PCR SCREEN  APTT  CBC WITH DIFFERENTIAL/PLATELET  COMPREHENSIVE METABOLIC PANEL  PROTIME-INR  TYPE AND SCREEN     IMAGES: Chest Xray 07/27/2018 FINDINGS: The heart size and mediastinal contours are within normal limits. Both lungs are clear. The visualized  skeletal structures are unremarkable.  IMPRESSION: No active cardiopulmonary disease.  EKG: 11/05/2018 Rate 100 bpm Atrial fibrillation  Abnormal ECG Compared to previous tracing the rate is slower  CV:  Past Medical History:  Diagnosis Date  . Anxiety   . Arthritis    oa  . Chest pain since 02-11-2013   pt thinks associated with reflux  . Dyspnea    with exertion  . GERD (gastroesophageal reflux disease)   . H/O hiatal hernia     Past Surgical History:  Procedure Laterality Date  . ANKLE SURGERY Right    reconstruction to muscle  . ESOPHAGEAL MANOMETRY N/A 04/17/2015   Procedure: ESOPHAGEAL MANOMETRY (EM);  Surgeon: Charna Elizabeth, MD;  Location: [facility];  Service: Endoscopy;  Laterality: N/A;  . ESOPHAGOGASTRODUODENOSCOPY  11/09/2003  . FOOT SURGERY     right  . HIP SURGERY  age 37   left, done x 2  . KNEE ARTHROSCOPY Left 10/20/2003 and 2012  . MYRINGOTOMY WITH TUBE PLACEMENT Right 08/29/2015   Procedure: MYRINGOTOMY WITH TUBE PLACEMENT;  Surgeon: Newman Pies, MD;  Location: [facility];  Service: ENT;  Laterality: Right;  . PH IMPEDANCE STUDY N/A 04/17/2015   Procedure: PH IMPEDANCE STUDY;  Surgeon: Charna Elizabeth, MD;  [facility];  Service: Endoscopy;  Laterality: N/A;  . SEPTOPLASTY N/A 08/29/2015   Procedure: SEPTOPLASTY;  Surgeon: Newman PiesSu Teoh, MD;  Location: [facility];  Service: ENT;  Laterality: N/A;  . TOTAL HIP ARTHROPLASTY Left 02/23/2013   Procedure: LEFT TOTAL HIP ARTHROPLASTY ANTERIOR APPROACH;  Surgeon: Shelda PalMatthew D Olin, MD;  Location: [facility];  Service: Orthopedics;  Laterality: Left;  . TURBINATE REDUCTION Bilateral 08/29/2015   Procedure: TURBINATE REDUCTION;  Surgeon: Newman PiesSu Teoh, MD;  Location: [facility];  Service: ENT;  Laterality: Bilateral;  . URETHRA SURGERY   4469yrs ago   for scar tissue    MEDICATIONS: . HYDROcodone-acetaminophen (NORCO/VICODIN) 5-325 MG tablet  . gabapentin (NEURONTIN) 300 MG capsule   . pantoprazole (PROTONIX) 40 MG tablet   No current facility-administered medications for this encounter.    Janey GentaJessica Tyiesha Brackney, PA-C [facility] [phone] 11/05/18 2:46 PM

## 2018-11-05 NOTE — Progress Notes (Signed)
CHART WITH DENISE SEXTON RN FOR PCR FOLLOW UP

## 2018-11-05 NOTE — Progress Notes (Signed)
REQUESTED COVID 19 TEST ORDER FROM [facility].

## 2018-11-06 ENCOUNTER — Other Ambulatory Visit (HOSPITAL_COMMUNITY)
Admission: RE | Admit: 2018-11-06 | Discharge: 2018-11-06 | Disposition: A | Discharge status: Home / Self Care | Payer: BLUE CROSS/BLUE SHIELD | Source: Ambulatory Visit | Attending: Orthopedic Surgery | Admitting: Orthopedic Surgery

## 2018-11-06 DIAGNOSIS — Z1159 Encounter for screening for other viral diseases: Secondary | ICD-10-CM | POA: Insufficient documentation

## 2018-11-06 DIAGNOSIS — R0683 Snoring: Secondary | ICD-10-CM | POA: Diagnosis not present

## 2018-11-06 DIAGNOSIS — I4891 Unspecified atrial fibrillation: Secondary | ICD-10-CM | POA: Diagnosis not present

## 2018-11-06 NOTE — H&P (Signed)
TOTAL KNEE ADMISSION H&P  Patient is being admitted for right total knee arthroplasty.  Subjective:  Chief Complaint:right knee pain.  HPI: Ryan [name], 53 y.o. male, has a history of pain and functional disability in the right knee due to arthritis and has failed non-surgical conservative treatments for greater than 12 weeks to includeNSAID's and/or analgesics, corticosteriod injections, viscosupplementation injections, flexibility and strengthening excercises and activity modification.  Onset of symptoms was gradual, starting 2 years ago with rapidlly worsening course since that time. The patient noted prior procedures on the knee to include  arthroscopy and menisectomy on the right knee(s).  Patient currently rates pain in the right knee(s) at 9 out of 10 with activity. Patient has night pain, worsening of pain with activity and weight bearing, pain that interferes with activities of daily living, pain with passive range of motion, crepitus and joint swelling.  Patient has evidence of periarticular osteophytes and joint space narrowing by imaging studies.There is no active infection.  Patient Active Problem List   Diagnosis Date Noted  . Infected insect bite 03/11/2017  . Cellulitis of neck 03/11/2017  . Expected blood loss anemia 02/24/2013  . Overweight (BMI 25.0-29.9) 02/24/2013  . S/P left THA, AA 02/23/2013   Past Medical History:  Diagnosis Date  . Anxiety   . Arthritis    oa  . Chest pain since 02-11-2013   pt thinks associated with reflux  . Dyspnea    with exertion  . GERD (gastroesophageal reflux disease)   . H/O hiatal hernia    Abnormal EKG: Atrial fibrillation  Past Surgical History:  Procedure Laterality Date  . ANKLE SURGERY Right    reconstruction to muscle  . ESOPHAGEAL MANOMETRY N/A 04/17/2015   Procedure: ESOPHAGEAL MANOMETRY (EM);  Surgeon: Charna Elizabeth, MD;  Location: [facility];  Service: Endoscopy;  Laterality: N/A;  . ESOPHAGOGASTRODUODENOSCOPY   11/09/2003  . FOOT SURGERY     right  . HIP SURGERY  age 18   left, done x 2  . KNEE ARTHROSCOPY Left 10/20/2003 and 2012  . MYRINGOTOMY WITH TUBE PLACEMENT Right 08/29/2015   Procedure: MYRINGOTOMY WITH TUBE PLACEMENT;  Surgeon: Newman Pies, MD;  Location: [facility];  Service: ENT;  Laterality: Right;  . PH IMPEDANCE STUDY N/A 04/17/2015   Procedure: PH IMPEDANCE STUDY;  Surgeon: Charna Elizabeth, MD;  Location: [facility];  Service: Endoscopy;  Laterality: N/A;  . SEPTOPLASTY N/A 08/29/2015   Procedure: SEPTOPLASTY;  Surgeon: Newman Pies, MD;  Location: [facility];  Service: ENT;  Laterality: N/A;  . TOTAL HIP ARTHROPLASTY Left 02/23/2013   Procedure: LEFT TOTAL HIP ARTHROPLASTY ANTERIOR APPROACH;  Surgeon: Shelda Pal, MD;  Location: [facility];  Service: Orthopedics;  Laterality: Left;  . TURBINATE REDUCTION Bilateral 08/29/2015   Procedure: TURBINATE REDUCTION;  Surgeon: Newman Pies, MD;  Location: [facility];  Service: ENT;  Laterality: Bilateral;  . URETHRA SURGERY   66yrs ago   for scar tissue       Current Outpatient Medications  Medication Sig Dispense Refill Last Dose  . gabapentin (NEURONTIN) 300 MG capsule Take 300 mg by mouth at bedtime.     . pantoprazole (PROTONIX) 40 MG tablet Take 1 tablet (40 mg total) by mouth daily. 30 tablet 1   . HYDROcodone-acetaminophen (NORCO/VICODIN) 5-325 MG tablet Take 1 tablet by mouth every 6 (six) hours as needed for moderate pain. Takes 1/2 tab      No Known Allergies  Social History  Tobacco Use  . Smoking status: Former Smoker    Packs/day: 1.50    Years: 25.00    Pack years: 37.50    Types: Cigarettes  . Smokeless tobacco: Never Used  . Tobacco comment: quit 2018  Substance Use Topics  . Alcohol use: Yes    Comment: ocassionally    No pertinent family medical history   Review of Systems  Constitutional: Negative.   HENT: Negative.   Eyes: Negative.   Respiratory: Negative.   Cardiovascular:  Negative.   Gastrointestinal: Positive for heartburn. Negative for abdominal pain, blood in stool, constipation, diarrhea, melena, nausea and vomiting.  Genitourinary: Negative.   Musculoskeletal: Positive for joint pain and myalgias. Negative for back pain, falls and neck pain.  Skin: Negative.   Neurological: Negative.   Endo/Heme/Allergies: Negative.   Psychiatric/Behavioral: Negative.     Objective:  Physical Exam  Constitutional: He is oriented to person, place, and time. He appears well-developed. No distress.  Overweight  HENT:  Head: Normocephalic and atraumatic.  Right Ear: External ear normal.  Left Ear: External ear normal.  Nose: Nose normal.  Mouth/Throat: Oropharynx is clear and moist.  Eyes: Conjunctivae and EOM are normal.  Neck: Normal range of motion. Neck supple.  Cardiovascular: Normal rate, regular rhythm, normal heart sounds and intact distal pulses.  No murmur heard. Respiratory: Effort normal and breath sounds normal. No respiratory distress. He has no wheezes.  GI: Soft. Bowel sounds are normal. He exhibits no distension. There is no abdominal tenderness.  Musculoskeletal:     Right hip: Normal.     Left hip: Normal.     Left knee: He exhibits decreased range of motion and swelling. He exhibits no effusion. Tenderness found. Medial joint line and lateral joint line tenderness noted.     Comments: Right knee tender along the medial joint line. Moderate patellofemoral crepitus. Mild effusion. Pain with motion. 5-120 degrees. No instability noted.  Neurological: He is alert and oriented to person, place, and time. He has normal strength. No sensory deficit.  Skin: No rash noted. He is not diaphoretic. No erythema.  Psychiatric: He has a normal mood and affect. His behavior is normal.    Vitals BP: 125/90 11/05/2018 08:13 am Pulse: 76 bpm 11/05/2018 08:13 am  Estimated body mass index is 29.94 kg/m as calculated from the following:   Height as of  11/05/18:  (1.905 m).   Weight as of 11/05/18: 108.6 kg.   Imaging Review Plain radiographs demonstrate severe degenerative joint disease of the right knee(s). The overall alignment ismild varus. The bone quality appears to be good for age and reported activity level.   Assessment/Plan:  End stage primary osteoarthritis, right knee   The patient history, physical examination, clinical judgment of the provider and imaging studies are consistent with end stage degenerative joint disease of the right knee(s) and total knee arthroplasty is deemed medically necessary. The treatment options including medical management, injection therapy arthroscopy and arthroplasty were discussed at length. The risks and benefits of total knee arthroplasty were presented and reviewed. The risks due to aseptic loosening, infection, stiffness, patella tracking problems, thromboembolic complications and other imponderables were discussed. The patient acknowledged the explanation, agreed to proceed with the plan and consent was signed. Patient is being admitted for inpatient treatment for surgery, pain control, PT, OT, prophylactic antibiotics, VTE prophylaxis, progressive ambulation and ADL's and discharge planning. The patient is planning to be discharged outpatient therapy at [facility]    Anticipated LOS equal to  or greater than 2 midnights due to - Age 52 and older with one or more of the following:  - Obesity  - Expected need for hospital services (PT, OT, Nursing) required for safe  discharge  - Anticipated need for postoperative skilled nursing care or inpatient rehab  - Active co-morbidities: Cardiac Arrhythmia OR   - Unanticipated findings during/Post Surgery: None  - Patient is a high risk of re-admission due to: None   Therapy Plans: outpatient therapy at [facility] Disposition: Home with wife Planned DVT prophylaxis: aspirin 325mg  BID DME needed: none needed PCP: Grace BushyHayden Morrison, NP Other: no anesthesia  concerns  Dimitri PedAmber Kyon Bentler, PA-C

## 2018-11-06 NOTE — Anesthesia Preprocedure Evaluation (Addendum)
Anesthesia Evaluation  Patient identified by MRN, date of birth, ID band Patient awake    Reviewed: Allergy & Precautions, H&P , NPO status , Patient's Chart, lab work & pertinent test results  Airway Mallampati: II   Neck ROM: full    Dental   Pulmonary shortness of breath, former smoker,    breath sounds clear to auscultation       Cardiovascular + dysrhythmias Atrial Fibrillation  Rhythm:regular Rate:Normal  Recent episode of Afib (11/05/18). Evaluated by his PCP and started beta blocker 11/06/18.  Rate controlled and asymptomatic   Neuro/Psych    GI/Hepatic hiatal hernia, GERD  ,  Endo/Other    Renal/GU      Musculoskeletal  (+) Arthritis ,   Abdominal   Peds  Hematology   Anesthesia Other Findings   Reproductive/Obstetrics                            Anesthesia Physical Anesthesia Plan  ASA: III  Anesthesia Plan: Spinal   Post-op Pain Management:  Regional for Post-op pain   Induction: Intravenous  PONV Risk Score and Plan: 1 and Ondansetron, Propofol infusion, Midazolam, Treatment may vary due to age or medical condition and Dexamethasone  Airway Management Planned: Simple Face Mask  Additional Equipment:   Intra-op Plan:   Post-operative Plan:   Informed Consent: I have reviewed the patients History and Physical, chart, labs and discussed the procedure including the risks, benefits and alternatives for the proposed anesthesia with the patient or authorized representative who has indicated his/her understanding and acceptance.       Plan Discussed with: Anesthesiologist, CRNA and Surgeon  Anesthesia Plan Comments: (See PAT note 11/05/18, Jodell Cipro, PA-C)       Anesthesia Quick Evaluation

## 2018-11-07 LAB — NOVEL CORONAVIRUS, NAA (HOSP ORDER, SEND-OUT TO REF LAB; TAT 18-24 HRS): SARS-CoV-2, NAA: NOT DETECTED

## 2018-11-09 ENCOUNTER — Telehealth: Payer: Self-pay | Admitting: *Deleted

## 2018-11-09 NOTE — Telephone Encounter (Signed)
REFERRAL SENT TO SCHEDULING AND NOTES ON FILE FROM [facility] [phone]. °

## 2018-11-10 MED ORDER — BUPIVACAINE LIPOSOME 1.3 % IJ SUSP
20.0000 mL | Freq: Once | INTRAMUSCULAR | Status: DC
  Filled 2018-11-10: qty 20

## 2018-11-11 ENCOUNTER — Inpatient Hospital Stay (HOSPITAL_COMMUNITY): Payer: BLUE CROSS/BLUE SHIELD | Admitting: Certified Registered Nurse Anesthetist

## 2018-11-11 ENCOUNTER — Inpatient Hospital Stay (HOSPITAL_COMMUNITY): Payer: BLUE CROSS/BLUE SHIELD | Admitting: Physician Assistant

## 2018-11-11 ENCOUNTER — Other Ambulatory Visit: Payer: Self-pay

## 2018-11-11 ENCOUNTER — Encounter (HOSPITAL_COMMUNITY)
Admission: RE | Disposition: A | Discharge status: Home / Self Care | Payer: Self-pay | Source: Home / Self Care | Attending: Orthopedic Surgery

## 2018-11-11 ENCOUNTER — Encounter (HOSPITAL_COMMUNITY): Payer: Self-pay | Admitting: *Deleted

## 2018-11-11 ENCOUNTER — Inpatient Hospital Stay (HOSPITAL_COMMUNITY)
Admission: RE | Admit: 2018-11-11 | Discharge: 2018-11-12 | DRG: 470 | Disposition: A | Discharge status: Home / Self Care | Payer: BLUE CROSS/BLUE SHIELD | Attending: Orthopedic Surgery | Admitting: Orthopedic Surgery

## 2018-11-11 DIAGNOSIS — F419 Anxiety disorder, unspecified: Secondary | ICD-10-CM | POA: Diagnosis present

## 2018-11-11 DIAGNOSIS — I4891 Unspecified atrial fibrillation: Secondary | ICD-10-CM | POA: Diagnosis present

## 2018-11-11 DIAGNOSIS — M25761 Osteophyte, right knee: Secondary | ICD-10-CM | POA: Diagnosis present

## 2018-11-11 DIAGNOSIS — M25561 Pain in right knee: Secondary | ICD-10-CM | POA: Diagnosis not present

## 2018-11-11 DIAGNOSIS — M24561 Contracture, right knee: Secondary | ICD-10-CM | POA: Diagnosis present

## 2018-11-11 DIAGNOSIS — M1711 Unilateral primary osteoarthritis, right knee: Secondary | ICD-10-CM | POA: Diagnosis not present

## 2018-11-11 DIAGNOSIS — Z87891 Personal history of nicotine dependence: Secondary | ICD-10-CM

## 2018-11-11 DIAGNOSIS — Z79899 Other long term (current) drug therapy: Secondary | ICD-10-CM | POA: Diagnosis not present

## 2018-11-11 DIAGNOSIS — E669 Obesity, unspecified: Secondary | ICD-10-CM | POA: Diagnosis not present

## 2018-11-11 DIAGNOSIS — F1290 Cannabis use, unspecified, uncomplicated: Secondary | ICD-10-CM | POA: Diagnosis not present

## 2018-11-11 DIAGNOSIS — K219 Gastro-esophageal reflux disease without esophagitis: Secondary | ICD-10-CM | POA: Diagnosis not present

## 2018-11-11 DIAGNOSIS — Z96651 Presence of right artificial knee joint: Secondary | ICD-10-CM

## 2018-11-11 DIAGNOSIS — Z96642 Presence of left artificial hip joint: Secondary | ICD-10-CM | POA: Diagnosis not present

## 2018-11-11 DIAGNOSIS — Z6829 Body mass index (BMI) 29.0-29.9, adult: Secondary | ICD-10-CM

## 2018-11-11 DIAGNOSIS — G8918 Other acute postprocedural pain: Secondary | ICD-10-CM | POA: Diagnosis not present

## 2018-11-11 HISTORY — PX: TOTAL KNEE ARTHROPLASTY: SHX125

## 2018-11-11 LAB — TYPE AND SCREEN
ABO/RH(D): A POS
Antibody Screen: NEGATIVE

## 2018-11-11 SURGERY — ARTHROPLASTY, KNEE, TOTAL
Anesthesia: Spinal | Laterality: Right

## 2018-11-11 MED ORDER — CEFAZOLIN SODIUM-DEXTROSE 1-4 GM/50ML-% IV SOLN
1.0000 g | Freq: Four times a day (QID) | INTRAVENOUS | Status: AC
  Administered 2018-11-11 (×2): 1 g via INTRAVENOUS
  Filled 2018-11-11 (×2): qty 50

## 2018-11-11 MED ORDER — METHOCARBAMOL 500 MG IVPB - SIMPLE MED
INTRAVENOUS | Status: AC
  Filled 2018-11-11: qty 50

## 2018-11-11 MED ORDER — BUPIVACAINE LIPOSOME 1.3 % IJ SUSP
INTRAMUSCULAR | Status: DC | PRN
  Administered 2018-11-11: 20 mL

## 2018-11-11 MED ORDER — PHENOL 1.4 % MT LIQD: 1.0000 | OROMUCOSAL | Status: DC | PRN

## 2018-11-11 MED ORDER — PHENYLEPHRINE HCL (PRESSORS) 10 MG/ML IV SOLN
INTRAVENOUS | Status: AC
  Filled 2018-11-11: qty 1

## 2018-11-11 MED ORDER — SODIUM CHLORIDE 0.9 % IV SOLN
INTRAVENOUS | Status: AC
  Filled 2018-11-11: qty 500000

## 2018-11-11 MED ORDER — CHLORHEXIDINE GLUCONATE 4 % EX LIQD: 60.0000 mL | Freq: Once | CUTANEOUS | Status: DC

## 2018-11-11 MED ORDER — METOCLOPRAMIDE HCL 5 MG/ML IJ SOLN: 5.0000 mg | Freq: Three times a day (TID) | INTRAMUSCULAR | Status: DC | PRN

## 2018-11-11 MED ORDER — POVIDONE-IODINE 10 % EX SWAB: 2.0000 "application " | Freq: Once | CUTANEOUS | Status: DC

## 2018-11-11 MED ORDER — BUPIVACAINE HCL (PF) 0.25 % IJ SOLN
INTRAMUSCULAR | Status: AC
  Filled 2018-11-11: qty 30

## 2018-11-11 MED ORDER — DEXAMETHASONE SODIUM PHOSPHATE 10 MG/ML IJ SOLN
INTRAMUSCULAR | Status: AC
  Filled 2018-11-11: qty 1

## 2018-11-11 MED ORDER — MIDAZOLAM HCL 5 MG/5ML IJ SOLN
INTRAMUSCULAR | Status: DC | PRN
  Administered 2018-11-11: 2 mg via INTRAVENOUS

## 2018-11-11 MED ORDER — OXYCODONE HCL 5 MG PO TABS
10.0000 mg | ORAL_TABLET | ORAL | Status: DC | PRN
  Administered 2018-11-11: 10 mg via ORAL
  Administered 2018-11-11: 15 mg via ORAL
  Filled 2018-11-11: qty 3
  Filled 2018-11-11: qty 2

## 2018-11-11 MED ORDER — SODIUM CHLORIDE (PF) 0.9 % IJ SOLN
INTRAMUSCULAR | Status: AC
  Filled 2018-11-11: qty 20

## 2018-11-11 MED ORDER — ATENOLOL 25 MG PO TABS
25.0000 mg | ORAL_TABLET | Freq: Every day | ORAL | Status: DC
  Administered 2018-11-11: 25 mg via ORAL
  Filled 2018-11-11: qty 1

## 2018-11-11 MED ORDER — SODIUM CHLORIDE (PF) 0.9 % IJ SOLN
INTRAMUSCULAR | Status: DC | PRN
  Administered 2018-11-11: 20 mL

## 2018-11-11 MED ORDER — LACTATED RINGERS IV SOLN: INTRAVENOUS | Status: DC

## 2018-11-11 MED ORDER — METHOCARBAMOL 500 MG PO TABS: 500.0000 mg | ORAL_TABLET | Freq: Four times a day (QID) | ORAL | Status: DC | PRN

## 2018-11-11 MED ORDER — METHOCARBAMOL 500 MG IVPB - SIMPLE MED
500.0000 mg | Freq: Four times a day (QID) | INTRAVENOUS | Status: DC | PRN
  Administered 2018-11-11: 500 mg via INTRAVENOUS
  Filled 2018-11-11: qty 50

## 2018-11-11 MED ORDER — OXYCODONE HCL 5 MG/5ML PO SOLN: 5.0000 mg | Freq: Once | ORAL | Status: DC | PRN

## 2018-11-11 MED ORDER — LACTATED RINGERS IV SOLN
INTRAVENOUS | Status: DC
  Administered 2018-11-11 (×2): via INTRAVENOUS

## 2018-11-11 MED ORDER — FENTANYL CITRATE (PF) 100 MCG/2ML IJ SOLN: 25.0000 ug | INTRAMUSCULAR | Status: DC | PRN

## 2018-11-11 MED ORDER — POLYETHYLENE GLYCOL 3350 17 G PO PACK: 17.0000 g | PACK | Freq: Every day | ORAL | Status: DC | PRN

## 2018-11-11 MED ORDER — ONDANSETRON HCL 4 MG/2ML IJ SOLN
INTRAMUSCULAR | Status: AC
  Filled 2018-11-11: qty 2

## 2018-11-11 MED ORDER — PROPOFOL 10 MG/ML IV BOLUS
INTRAVENOUS | Status: AC
  Filled 2018-11-11: qty 20

## 2018-11-11 MED ORDER — ROPIVACAINE HCL 5 MG/ML IJ SOLN
INTRAMUSCULAR | Status: DC | PRN
  Administered 2018-11-11: 20 mL via PERINEURAL

## 2018-11-11 MED ORDER — ONDANSETRON HCL 4 MG/2ML IJ SOLN: 4.0000 mg | Freq: Four times a day (QID) | INTRAMUSCULAR | Status: DC | PRN

## 2018-11-11 MED ORDER — LACTATED RINGERS IV SOLN
INTRAVENOUS | Status: DC
  Administered 2018-11-11: 12:00:00 via INTRAVENOUS

## 2018-11-11 MED ORDER — OXYCODONE HCL 5 MG PO TABS
5.0000 mg | ORAL_TABLET | ORAL | Status: DC | PRN
  Administered 2018-11-11 – 2018-11-12 (×3): 10 mg via ORAL
  Filled 2018-11-11 (×3): qty 2

## 2018-11-11 MED ORDER — PHENYLEPHRINE 40 MCG/ML (10ML) SYRINGE FOR IV PUSH (FOR BLOOD PRESSURE SUPPORT)
PREFILLED_SYRINGE | INTRAVENOUS | Status: DC | PRN
  Administered 2018-11-11: 40 ug via INTRAVENOUS

## 2018-11-11 MED ORDER — PANTOPRAZOLE SODIUM 40 MG PO TBEC
40.0000 mg | DELAYED_RELEASE_TABLET | Freq: Every day | ORAL | Status: DC
  Administered 2018-11-11 – 2018-11-12 (×2): 40 mg via ORAL
  Filled 2018-11-11 (×2): qty 1

## 2018-11-11 MED ORDER — OXYCODONE HCL 5 MG PO TABS: 5.0000 mg | ORAL_TABLET | Freq: Once | ORAL | Status: DC | PRN

## 2018-11-11 MED ORDER — FERROUS SULFATE 325 (65 FE) MG PO TABS
325.0000 mg | ORAL_TABLET | Freq: Three times a day (TID) | ORAL | Status: DC
  Administered 2018-11-12: 325 mg via ORAL
  Filled 2018-11-11: qty 1

## 2018-11-11 MED ORDER — PROPOFOL 500 MG/50ML IV EMUL
INTRAVENOUS | Status: DC | PRN
  Administered 2018-11-11: 50 ug/kg/min via INTRAVENOUS

## 2018-11-11 MED ORDER — BISACODYL 5 MG PO TBEC: 5.0000 mg | DELAYED_RELEASE_TABLET | Freq: Every day | ORAL | Status: DC | PRN

## 2018-11-11 MED ORDER — FLEET ENEMA 7-19 GM/118ML RE ENEM: 1.0000 | ENEMA | Freq: Once | RECTAL | Status: DC | PRN

## 2018-11-11 MED ORDER — TRANEXAMIC ACID-NACL 1000-0.7 MG/100ML-% IV SOLN
1000.0000 mg | INTRAVENOUS | Status: AC
  Administered 2018-11-11: 09:00:00 1000 mg via INTRAVENOUS
  Filled 2018-11-11: qty 100

## 2018-11-11 MED ORDER — FENTANYL CITRATE (PF) 100 MCG/2ML IJ SOLN
INTRAMUSCULAR | Status: AC
  Filled 2018-11-11: qty 2

## 2018-11-11 MED ORDER — BUPIVACAINE IN DEXTROSE 0.75-8.25 % IT SOLN
INTRATHECAL | Status: DC | PRN
  Administered 2018-11-11: 2 mL via INTRATHECAL

## 2018-11-11 MED ORDER — SODIUM CHLORIDE 0.9 % IR SOLN
Status: DC | PRN
  Administered 2018-11-11: 1000 mL

## 2018-11-11 MED ORDER — ACETAMINOPHEN 500 MG PO TABS
1000.0000 mg | ORAL_TABLET | Freq: Four times a day (QID) | ORAL | Status: DC
  Administered 2018-11-11 – 2018-11-12 (×3): 1000 mg via ORAL
  Filled 2018-11-11 (×3): qty 2

## 2018-11-11 MED ORDER — ALUM & MAG HYDROXIDE-SIMETH 200-200-20 MG/5ML PO SUSP
30.0000 mL | ORAL | Status: DC | PRN
  Filled 2018-11-11: qty 30

## 2018-11-11 MED ORDER — ONDANSETRON HCL 4 MG PO TABS: 4.0000 mg | ORAL_TABLET | Freq: Four times a day (QID) | ORAL | Status: DC | PRN

## 2018-11-11 MED ORDER — DOCUSATE SODIUM 100 MG PO CAPS
100.0000 mg | ORAL_CAPSULE | Freq: Two times a day (BID) | ORAL | Status: DC
  Administered 2018-11-11 – 2018-11-12 (×2): 100 mg via ORAL
  Filled 2018-11-11 (×2): qty 1

## 2018-11-11 MED ORDER — HYDROMORPHONE HCL 1 MG/ML IJ SOLN
0.5000 mg | INTRAMUSCULAR | Status: DC | PRN
  Administered 2018-11-11 (×3): 0.5 mg via INTRAVENOUS
  Administered 2018-11-12: 1 mg via INTRAVENOUS
  Filled 2018-11-11 (×4): qty 1

## 2018-11-11 MED ORDER — PROPOFOL 10 MG/ML IV BOLUS
INTRAVENOUS | Status: AC
  Filled 2018-11-11: qty 60

## 2018-11-11 MED ORDER — PHENYLEPHRINE 40 MCG/ML (10ML) SYRINGE FOR IV PUSH (FOR BLOOD PRESSURE SUPPORT)
PREFILLED_SYRINGE | INTRAVENOUS | Status: AC
  Filled 2018-11-11: qty 10

## 2018-11-11 MED ORDER — METOCLOPRAMIDE HCL 5 MG PO TABS: 5.0000 mg | ORAL_TABLET | Freq: Three times a day (TID) | ORAL | Status: DC | PRN

## 2018-11-11 MED ORDER — DEXAMETHASONE SODIUM PHOSPHATE 10 MG/ML IJ SOLN
INTRAMUSCULAR | Status: DC | PRN
  Administered 2018-11-11: 10 mg via INTRAVENOUS

## 2018-11-11 MED ORDER — MIDAZOLAM HCL 2 MG/2ML IJ SOLN
INTRAMUSCULAR | Status: AC
  Filled 2018-11-11: qty 2

## 2018-11-11 MED ORDER — CEFAZOLIN SODIUM-DEXTROSE 2-4 GM/100ML-% IV SOLN
2.0000 g | INTRAVENOUS | Status: AC
  Administered 2018-11-11: 09:00:00 2 g via INTRAVENOUS
  Filled 2018-11-11: qty 100

## 2018-11-11 MED ORDER — RIVAROXABAN 10 MG PO TABS
10.0000 mg | ORAL_TABLET | Freq: Every day | ORAL | Status: DC
  Administered 2018-11-12: 10 mg via ORAL
  Filled 2018-11-11: qty 1

## 2018-11-11 MED ORDER — MENTHOL 3 MG MT LOZG: 1.0000 | LOZENGE | OROMUCOSAL | Status: DC | PRN

## 2018-11-11 MED ORDER — SODIUM CHLORIDE 0.9 % IV SOLN
INTRAVENOUS | Status: DC | PRN
  Administered 2018-11-11: 10:00:00 500 mL

## 2018-11-11 MED ORDER — PROPOFOL 10 MG/ML IV BOLUS
INTRAVENOUS | Status: DC | PRN
  Administered 2018-11-11: 10 mg via INTRAVENOUS
  Administered 2018-11-11: 20 mg via INTRAVENOUS
  Administered 2018-11-11 (×2): 10 mg via INTRAVENOUS
  Administered 2018-11-11: 20 mg via INTRAVENOUS
  Administered 2018-11-11: 10 mg via INTRAVENOUS

## 2018-11-11 MED ORDER — ONDANSETRON HCL 4 MG/2ML IJ SOLN
INTRAMUSCULAR | Status: DC | PRN
  Administered 2018-11-11: 4 mg via INTRAVENOUS

## 2018-11-11 MED ORDER — GABAPENTIN 300 MG PO CAPS
300.0000 mg | ORAL_CAPSULE | Freq: Every day | ORAL | Status: DC
  Administered 2018-11-11: 300 mg via ORAL
  Filled 2018-11-11: qty 1

## 2018-11-11 MED ORDER — ACETAMINOPHEN 325 MG PO TABS: 325.0000 mg | ORAL_TABLET | Freq: Four times a day (QID) | ORAL | Status: DC | PRN

## 2018-11-11 MED ORDER — FENTANYL CITRATE (PF) 100 MCG/2ML IJ SOLN
INTRAMUSCULAR | Status: DC | PRN
  Administered 2018-11-11: 50 ug via INTRAVENOUS
  Administered 2018-11-11 (×2): 25 ug via INTRAVENOUS

## 2018-11-11 SURGICAL SUPPLY — 72 items
AGENT HMST SPONGE THK3/8 (HEMOSTASIS) ×1
APL PRP STRL LF DISP 70% ISPRP (MISCELLANEOUS) ×1
BAG DECANTER FOR FLEXI CONT (MISCELLANEOUS) ×2 IMPLANT
BAG SPEC THK2 15X12 ZIP CLS (MISCELLANEOUS)
BAG ZIPLOCK 12X15 (MISCELLANEOUS) IMPLANT
BANDAGE ACE 4X5 VEL STRL LF (GAUZE/BANDAGES/DRESSINGS) ×2 IMPLANT
BANDAGE ACE 6X5 VEL STRL LF (GAUZE/BANDAGES/DRESSINGS) ×2 IMPLANT
BLADE SAG 18X100X1.27 (BLADE) ×2 IMPLANT
BLADE SAW SGTL 11.0X1.19X90.0M (BLADE) ×2 IMPLANT
BLADE SURG SZ10 CARB STEEL (BLADE) ×4 IMPLANT
BNDG GAUZE ELAST 4 BULKY (GAUZE/BANDAGES/DRESSINGS) ×4 IMPLANT
CEMENT HV SMART SET (Cement) ×4 IMPLANT
CEMENT TIBIA MBT SIZE 5 (Knees) IMPLANT
CHLORAPREP W/TINT 26 (MISCELLANEOUS) ×2 IMPLANT
COVER SURGICAL LIGHT HANDLE (MISCELLANEOUS) ×2 IMPLANT
COVER WAND RF STERILE (DRAPES) IMPLANT
CUFF TOURN SGL QUICK 34 (TOURNIQUET CUFF) ×2
CUFF TRNQT CYL 34X4.125X (TOURNIQUET CUFF) ×1 IMPLANT
DECANTER SPIKE VIAL GLASS SM (MISCELLANEOUS) ×2 IMPLANT
DRAPE INCISE IOBAN 66X45 STRL (DRAPES) IMPLANT
DRAPE U-SHAPE 47X51 STRL (DRAPES) ×2 IMPLANT
DRSG ADAPTIC 3X8 NADH LF (GAUZE/BANDAGES/DRESSINGS) ×2 IMPLANT
DRSG PAD ABDOMINAL 8X10 ST (GAUZE/BANDAGES/DRESSINGS) ×4 IMPLANT
ELECT BLADE TIP CTD 4 INCH (ELECTRODE) ×2 IMPLANT
ELECT REM PT RETURN 15FT ADLT (MISCELLANEOUS) ×2 IMPLANT
EVACUATOR 1/8 PVC DRAIN (DRAIN) ×2 IMPLANT
FACESHIELD WRAPAROUND (MASK) ×10 IMPLANT
FACESHIELD WRAPAROUND OR TEAM (MASK) ×1 IMPLANT
FEMUR SIGMA PS SZ 4.0 R (Femur) ×1 IMPLANT
GAUZE SPONGE 4X4 12PLY STRL (GAUZE/BANDAGES/DRESSINGS) ×2 IMPLANT
GLOVE BIOGEL PI IND STRL 6.5 (GLOVE) ×1 IMPLANT
GLOVE BIOGEL PI IND STRL 8.5 (GLOVE) ×1 IMPLANT
GLOVE BIOGEL PI INDICATOR 6.5 (GLOVE) ×1
GLOVE BIOGEL PI INDICATOR 8.5 (GLOVE) ×1
GLOVE ECLIPSE 8.0 STRL XLNG CF (GLOVE) ×4 IMPLANT
GLOVE SURG SS PI 6.5 STRL IVOR (GLOVE) ×2 IMPLANT
GOWN STRL REUS W/TWL LRG LVL3 (GOWN DISPOSABLE) ×2 IMPLANT
GOWN STRL REUS W/TWL XL LVL3 (GOWN DISPOSABLE) ×2 IMPLANT
HANDPIECE INTERPULSE COAX TIP (DISPOSABLE) ×2
HEMOSTAT SPONGE AVITENE ULTRA (HEMOSTASIS) ×2 IMPLANT
HOLDER FOLEY CATH W/STRAP (MISCELLANEOUS) ×1 IMPLANT
IMMOBILIZER KNEE 20 (SOFTGOODS) ×2
IMMOBILIZER KNEE 20 THIGH 36 (SOFTGOODS) ×1 IMPLANT
KIT TURNOVER KIT A (KITS) IMPLANT
MANIFOLD NEPTUNE II (INSTRUMENTS) ×2 IMPLANT
NS IRRIG 1000ML POUR BTL (IV SOLUTION) IMPLANT
PACK TOTAL KNEE CUSTOM (KITS) ×2 IMPLANT
PADDING CAST COTTON 6X4 STRL (CAST SUPPLIES) ×2 IMPLANT
PATELLA DOME PFC 41MM (Knees) ×1 IMPLANT
PENCIL SMOKE EVAC W/HOLSTER (ELECTROSURGICAL) ×2 IMPLANT
PIN STEINMAN FIXATION KNEE (PIN) ×1 IMPLANT
PLATE ROT INSERT 12.5MM SIZE 4 (Plate) ×1 IMPLANT
PROTECTOR NERVE ULNAR (MISCELLANEOUS) ×2 IMPLANT
SET HNDPC FAN SPRY TIP SCT (DISPOSABLE) ×1 IMPLANT
SET PAD KNEE POSITIONER (MISCELLANEOUS) ×2 IMPLANT
SPONGE LAP 18X18 RF (DISPOSABLE) IMPLANT
STRIP CLOSURE SKIN 1/2X4 (GAUZE/BANDAGES/DRESSINGS) ×4 IMPLANT
SUT BONE WAX W31G (SUTURE) IMPLANT
SUT MNCRL AB 4-0 PS2 18 (SUTURE) ×2 IMPLANT
SUT STRATAFIX 0 PDS 27 VIOLET (SUTURE) ×2
SUT VIC AB 1 CT1 27 (SUTURE) ×2
SUT VIC AB 1 CT1 27XBRD ANTBC (SUTURE) ×1 IMPLANT
SUT VIC AB 2-0 CT1 27 (SUTURE) ×6
SUT VIC AB 2-0 CT1 TAPERPNT 27 (SUTURE) ×3 IMPLANT
SUTURE STRATFX 0 PDS 27 VIOLET (SUTURE) ×1 IMPLANT
SYR 20CC LL (SYRINGE) ×4 IMPLANT
TIBIA MBT CEMENT SIZE 5 (Knees) ×2 IMPLANT
TOWER CARTRIDGE SMART MIX (DISPOSABLE) ×2 IMPLANT
TRAY FOLEY MTR SLVR 16FR STAT (SET/KITS/TRAYS/PACK) ×2 IMPLANT
WATER STERILE IRR 1000ML POUR (IV SOLUTION) ×3 IMPLANT
WRAP KNEE MAXI GEL POST OP (GAUZE/BANDAGES/DRESSINGS) ×2 IMPLANT
YANKAUER SUCT BULB TIP 10FT TU (MISCELLANEOUS) ×2 IMPLANT

## 2018-11-11 NOTE — Brief Op Note (Signed)
11/11/2018  10:36 AM  PATIENT:  Ryan [name]  53 y.o. male  PRE-OPERATIVE DIAGNOSIS:  right knee Primary  Osteoarthritis with Flexion Contractures  POST-OPERATIVE DIAGNOSIS:Same as Pre-Op  PROCEDURE:  Procedure(s) with comments: TOTAL KNEE ARTHROPLASTY (Right) -  SURGEON:  Surgeon(s) and Role:    [name], MD - Primary  PHYSICIAN ASSISTANT:Amber [place] PA  ANESTHESIA:   spinal and Femoral Nerve Block on the Right  EBL:  50 mL   BLOOD ADMINISTERED:none  DRAINS: (one) Hemovact drain(s) in the Right with  Suction Open   LOCAL MEDICATIONS USED:  OTHER 20ccof Exparel mixed with 20cc of Normal Saline  SPECIMEN:  No Specimen  DISPOSITION OF SPECIMEN:  N/A  COUNTS:  YES  TOURNIQUET:  * Missing tourniquet times found for documented tourniquets in log: [id number] *  DICTATION: .Other Dictation: Dictation Number [id number]  PLAN OF CARE: Admit to inpatient   PATIENT DISPOSITION:  Stable in OR   Delay start of Pharmacological VTE agent (>24hrs) due to surgical blood loss or risk of bleeding: yes

## 2018-11-11 NOTE — Anesthesia Procedure Notes (Signed)
Spinal  Patient location during procedure: OR Start time: 11/11/2018 8:48 AM End time: 11/11/2018 8:53 AM Staffing Anesthesiologist: Achille Rich, MD Resident/CRNA: Epimenio Sarin, CRNA Performed: anesthesiologist and resident/CRNA  Preanesthetic Checklist Completed: patient identified, surgical consent, pre-op evaluation, timeout performed, IV checked, risks and benefits discussed and monitors and equipment checked Spinal Block Patient position: sitting Prep: DuraPrep Patient monitoring: heart rate, cardiac monitor, continuous pulse ox and blood pressure Approach: midline Location: L3-4 Injection technique: single-shot Needle Needle type: Pencan  Needle gauge: 24 G Needle length: 10 cm Needle insertion depth: 8.5 cm Assessment Sensory level: T4 Additional Notes First attempt by Berneice Gandy CRNA, unable to obtain CSF. Second attempt by Dr. Chaney Malling with positive CSF, negative heme/parasthesia.

## 2018-11-11 NOTE — Discharge Instructions (Signed)
° °Dr. Ronald Gioffre °[facility] °[address] °[place], [place] [address] °[phone] ° °TOTAL KNEE REPLACEMENT POSTOPERATIVE DIRECTIONS ° °Knee Rehabilitation, Guidelines Following Surgery  °Results after knee surgery are often greatly improved when you follow the exercise, range of motion and muscle strengthening exercises prescribed by your doctor. Safety measures are also important to protect the knee from further injury. Any time any of these exercises cause you to have increased pain or swelling in your knee joint, decrease the amount until you are comfortable again and slowly increase them. If you have problems or questions, call your caregiver or physical therapist for advice.  ° °HOME CARE INSTRUCTIONS  °Remove items at home which could result in a fall. This includes throw rugs or furniture in walking pathways.  °· ICE to the affected knee every three hours for 30 minutes at a time and then as needed for pain and swelling.  Continue to use ice on the knee for pain and swelling from surgery. You may notice swelling that will progress down to the foot and ankle.  This is normal after surgery.  Elevate the leg when you are not up walking on it.   °· Continue to use the breathing machine which will help keep your temperature down.  It is common for your temperature to cycle up and down following surgery, especially at night when you are not up moving around and exerting yourself.  The breathing machine keeps your lungs expanded and your temperature down. °· Do not place pillow under knee, focus on keeping the knee straight while resting ° °DIET °You may resume your previous home diet once your are discharged from the hospital. ° °DRESSING / WOUND CARE / SHOWERING °You may shower 3 days after surgery, but keep the wounds dry during showering.  You may use an occlusive plastic wrap (Press'n Seal for example), NO SOAKING/SUBMERGING IN THE BATHTUB.  If the bandage gets wet, change with a  clean dry gauze.  If the incision gets wet, pat the wound dry with a clean towel. °You may start showering once you are discharged home but do not submerge the incision under water. Just pat the incision dry and apply a dry gauze dressing on daily. °Change the surgical dressing daily and reapply a dry dressing each time. ° °ACTIVITY °Walk with your walker as instructed. °Use walker as long as suggested by your caregivers. °Avoid periods of inactivity such as sitting longer than an hour when not asleep. This helps prevent blood clots.  °You may resume a sexual relationship in one month or when given the OK by your doctor.  °You may return to work once you are cleared by your doctor.  °Do not drive a car for 6 weeks or until released by you surgeon.  °Do not drive while taking narcotics. ° °WEIGHT BEARING °Weight bearing as tolerated with assist device (walker, cane, etc) as directed, use it as long as suggested by your surgeon or therapist, typically at least 4-6 weeks. ° °POSTOPERATIVE CONSTIPATION PROTOCOL °Constipation - defined medically as fewer than three stools per week and severe constipation as less than one stool per week. ° °One of the most common issues patients have following surgery is constipation.  Even if you have a regular bowel pattern at home, your normal regimen is likely to be disrupted due to multiple reasons following surgery.  Combination of anesthesia, postoperative narcotics, change in appetite and fluid intake all can affect your bowels.  In order to   avoid complications following surgery, here are some recommendations in order to help you during your recovery period. ° °Colace (docusate) - Pick up an over-the-counter form of Colace or another stool softener and take twice a day as long as you are requiring postoperative pain medications.  Take with a full glass of water daily.  If you experience loose stools or diarrhea, hold the colace until you stool forms back up.  If your symptoms do  not get better within 1 week or if they get worse, check with your doctor. ° °Dulcolax (bisacodyl) - Pick up over-the-counter and take as directed by the product packaging as needed to assist with the movement of your bowels.  Take with a full glass of water.  Use this product as needed if not relieved by Colace only.  ° °MiraLax (polyethylene glycol) - Pick up over-the-counter to have on hand.  MiraLax is a solution that will increase the amount of water in your bowels to assist with bowel movements.  Take as directed and can mix with a glass of water, juice, soda, coffee, or tea.  Take if you go more than two days without a movement. °Do not use MiraLax more than once per day. Call your doctor if you are still constipated or irregular after using this medication for 7 days in a row. ° °If you continue to have problems with postoperative constipation, please contact the office for further assistance and recommendations.  If you experience "the worst abdominal pain ever" or develop nausea or vomiting, please contact the office immediatly for further recommendations for treatment. ° °ITCHING ° If you experience itching with your medications, try taking only a single pain pill, or even half a pain pill at a time.  You can also use Benadryl over the counter for itching or also to help with sleep.  ° °TED HOSE STOCKINGS °Wear the elastic stockings on both legs for three weeks following surgery during the day but you may remove then at night for sleeping. ° °MEDICATIONS °See your medication summary on the “After Visit Summary” that the nursing staff will review with you prior to discharge.  You may have some home medications which will be placed on hold until you complete the course of blood thinner medication.  It is important for you to complete the blood thinner medication as prescribed by your surgeon.  Continue your approved medications as instructed at time of discharge. ° °PRECAUTIONS °If you experience chest pain  or shortness of breath - call 911 immediately for transfer to the hospital emergency department.  °If you develop a fever greater that 101 F, purulent drainage from wound, increased redness or drainage from wound, foul odor from the wound/dressing, or calf pain - CONTACT YOUR SURGEON.   °                                                °FOLLOW-UP APPOINTMENTS °Make sure you keep all of your appointments after your operation with your surgeon and caregivers. You should call the office at the above phone number and make an appointment for approximately two weeks after the date of your surgery or on the date instructed by your surgeon outlined in the "After Visit Summary". ° ° °RANGE OF MOTION AND STRENGTHENING EXERCISES  °Rehabilitation of the knee is important following a knee injury or an operation. After   just a few days of immobilization, the muscles of the thigh which control the knee become weakened and shrink (atrophy). Knee exercises are designed to build up the tone and strength of the thigh muscles and to improve knee motion. Often times heat used for twenty to thirty minutes before working out will loosen up your tissues and help with improving the range of motion but do not use heat for the first two weeks following surgery. These exercises can be done on a training (exercise) mat, on the floor, on a table or on a bed. Use what ever works the best and is most comfortable for you Knee exercises include:  °Leg Lifts - While your knee is still immobilized in a splint or cast, you can do straight leg raises. Lift the leg to 60 degrees, hold for 3 sec, and slowly lower the leg. Repeat 10-20 times 2-3 times daily. Perform this exercise against resistance later as your knee gets better.  °Quad and Hamstring Sets - Tighten up the muscle on the front of the thigh (Quad) and hold for 5-10 sec. Repeat this 10-20 times hourly. Hamstring sets are done by pushing the foot backward against an object and holding for 5-10  sec. Repeat as with quad sets.  °· Leg Slides: Lying on your back, slowly slide your foot toward your buttocks, bending your knee up off the floor (only go as far as is comfortable). Then slowly slide your foot back down until your leg is flat on the floor again. °· Angel Wings: Lying on your back spread your legs to the side as far apart as you can without causing discomfort.  °A rehabilitation program following serious knee injuries can speed recovery and prevent re-injury in the future due to weakened muscles. Contact your doctor or a physical therapist for more information on knee rehabilitation.  ° °IF YOU ARE TRANSFERRED TO A SKILLED REHAB FACILITY °If the patient is transferred to a skilled rehab facility following release from the hospital, a list of the current medications will be sent to the facility for the patient to continue.  When discharged from the skilled rehab facility, please have the facility set up the patient's Home Health Physical Therapy prior to being released. Also, the skilled facility will be responsible for providing the patient with their medications at time of release from the facility to include their pain medication, the muscle relaxants, and their blood thinner medication. If the patient is still at the rehab facility at time of the two week follow up appointment, the skilled rehab facility will also need to assist the patient in arranging follow up appointment in our office and any transportation needs. ° °MAKE SURE YOU:  °Understand these instructions.  °Get help right away if you are not doing well or get worse.  ° ° °Pick up stool softner and laxative for home use following surgery while on pain medications. °Do not submerge incision under water. °Please use good hand washing techniques while changing dressing each day. °May shower starting three days after surgery. °Please use a clean towel to pat the incision dry following showers. °Continue to use ice for pain and swelling  after surgery. °Do not use any lotions or creams on the incision until instructed by your surgeon. °

## 2018-11-11 NOTE — Op Note (Signed)
NAME: Ryan [name], Ryan [name] MEDICAL RECORD OY:[id number] ACCOUNT [id number] DATE OF BIRTH:[birth year] FACILITY: [facility] LOCATION: [facility] PHYSICIAN:Dylan Monforte Sanjuana Kava, MD  OPERATIVE REPORT  DATE OF PROCEDURE:  11/11/2018  SURGEON:  [name], MD  ASSISTANT:  Dimitri Ped PA.  PREOPERATIVE DIAGNOSES: 1.  Flexion contractures, right knee. 2.  Severe primary osteoarthritis with bone-on-bone right knee.  POSTOPERATIVE DIAGNOSES:  1.  Flexion contractures, right knee. 2.  Severe primary osteoarthritis with bone-on-bone right knee.  PROCEDURE:  Right total knee arthroplasty utilizing the DePuy system.  All 3 components were cemented.  Femoral component was a size 4, right.  The tibial tray was a size 5.  The insert was a size 4, 12.5 mm thickness and the patella was a size 41 with 3  pegs.  Preop, the patient had spinal anesthesia with a right femoral nerve block.  He was also given 2 grams of IV Ancef and 1 gram of TXA.    DESCRIPTION OF PROCEDURE:  A sterile prep and draping were carried out after the appropriate timeout.  I also marked the appropriate right leg in the holding area.  After sterile prep and draping, a timeout, we then exsanguinated the leg with the  Esmarch.  Tourniquet was elevated 325 mmHg.  At this time, the knee was placed into the Mayo knee holder and flexed.  An anterior approach to knee was carried out.  Two flaps were created.  Following that, I carried out a median parapatellar incision.  I  reflected the patella laterally.  At this particular time, I did medial and lateral meniscectomies and excised the anterior and posterior cruciate ligaments.  He had rather significant primary osteoarthritis.  Initial drill hole was made in the  intercondylar notch.  At this time, the guide rod was inserted up the femoral canal and we removed 12 mm thickness off of the distal femur with the appropriate guide.  Following that, we measured the femur to be a size 4.  We then carried  out, anterior,  posterior and chamfering cuts with a jig in place.  After this, we then directed our attention to the tibial plateau.  We removed the spinous processes.  We completed our meniscectomies.  We then inserted our external guide and removed approximately 4 mm  thickness off the tibial plateau.  We had a nice even cut at this point.  We then inserted our lamina spreaders and removed the posterior spurs from the femoral condyles.  I then inserted my spacer blocks.  We had a nice fit with a 12.5 mm spacer block.   We then removed that.  We then directed attention to completing the keel cut of the tibial plateau followed by the notch cut of the distal femur.  Following that, the trial components were inserted.  We had excellent function and good stability and  motion of the knee.  With the knee extended and the trial components in place.  I did a resurfacing procedure on the patella and we measured the patella to be a size 41.  Three drill holes were made in the articular surface of the patella in the usual  fashion.  The patellar button was inserted.  We had a nice smooth fit.  All 3 components were removed.  We thoroughly picked out the knee and cemented all 3 components in simultaneously.  Once the cement was hardened, we removed all loose pieces of  cement.  I then inserted my permanent tibial insert, which was a size 4, 12.5  mm thickness, reduced the knee and we had nice stability and good motion.  A Hemovac drain was inserted and wound then was closed in layers in the usual fashion.  We injected  20 mL of Exparel mixed with 20 mL of normal saline.  Sterile dressings were applied.  Once again the surgeon was Dr. Darrelyn HillockGioffre and assistant Dimitri PedAmber Constable PA.    Note, during the case, we also irrigated with antibiotic solution.  After the cement was hardened and all components were in place, I removed the trial and then water picked out the knee to make sure there were no other loose pieces of cement  before we  closed.  AN/NUANCE  D:11/11/2018 T:11/11/2018 JOB:[id number]

## 2018-11-11 NOTE — Transfer of Care (Signed)
Immediate Anesthesia Transfer of Care Note  Patient: Ryan [name]  Procedure(s) Performed: TOTAL KNEE ARTHROPLASTY (Right )  Patient Location: PACU  Anesthesia Type:Spinal  Level of Consciousness: drowsy and patient cooperative  Airway & Oxygen Therapy: Patient Spontanous Breathing and Patient connected to face mask oxygen  Post-op Assessment: Report given to RN and Post -op Vital signs reviewed and stable  Post vital signs: Reviewed and stable  Last Vitals:  Vitals Value Taken Time  BP    Temp    Pulse 51 11/11/2018 11:11 AM  Resp 11 11/11/2018 11:11 AM  SpO2 99 % 11/11/2018 11:11 AM  Vitals shown include unvalidated device data.  Last Pain:  Vitals:   11/11/18 4401  TempSrc:   PainSc: 3          Complications: No apparent anesthesia complications

## 2018-11-11 NOTE — Interval H&P Note (Signed)
History and Physical Interval Note:  11/11/2018 8:36 AM  Particia Lather  has presented today for surgery, with the diagnosis of right knee osteoarthritis.  The various methods of treatment have been discussed with the patient and family. After consideration of risks, benefits and other options for treatment, the patient has consented to  Procedure(s) with comments: TOTAL KNEE ARTHROPLASTY (Right) - as a surgical intervention.  The patient's history has been reviewed, patient examined, no change in status, stable for surgery.  I have reviewed the patient's chart and labs.  Questions were answered to the patient's satisfaction.     [name]

## 2018-11-11 NOTE — Interval H&P Note (Signed)
History and Physical Interval Note:  11/11/2018 8:37 AM  Particia Lather  has presented today for surgery, with the diagnosis of right knee osteoarthritis.  The various methods of treatment have been discussed with the patient and family. After consideration of risks, benefits and other options for treatment, the patient has consented to  Procedure(s) with comments: TOTAL KNEE ARTHROPLASTY (Right) - as a surgical intervention.  The patient's history has been reviewed, patient examined, no change in status, stable for surgery.  I have reviewed the patient's chart and labs.  Questions were answered to the patient's satisfaction.     [name]

## 2018-11-11 NOTE — Evaluation (Signed)
Physical Therapy Evaluation Patient Details Name: Ryan [name] MRN: [id number] DOB: 11-29-1965 Today's Date: 11/11/2018   History of Present Illness  53 yo male s/p R TKR on 11/11/18. PMH includes L DA-THA, anxiety, OA, DOE, GERD, afib.   Clinical Impression  Pt presents with R knee pain, decreased R knee ROM, increased time and effort to perform mobility tasks, post-operative RLE weakness, and decreased tolerance for ambulation due to pain. Pt to benefit from acute PT to address deficits. Pt ambulated hallway distance with RW with min guard assist.  Pt educated on ankle pumps (20/hour) to perform this afternoon/evening to increase circulation, to pt's tolerance and limited by pain. Pt plans to d/c and receive OPPT for follow up. PT to progress mobility as tolerated, and will continue to follow acutely.        Follow Up Recommendations Follow surgeon's recommendation for DC plan and follow-up therapies;Supervision for mobility/OOB(OPPT)    Equipment Recommendations  None recommended by PT    Recommendations for Other Services       Precautions / Restrictions Precautions Precautions: Fall Required Braces or Orthoses: Knee Immobilizer - Right Knee Immobilizer - Right: On when out of bed or walking;Discontinue once straight leg raise with < 10 degree lag Restrictions Weight Bearing Restrictions: No      Mobility  Bed Mobility Overal bed mobility: Needs Assistance Bed Mobility: Supine to Sit     Supine to sit: Min guard;HOB elevated     General bed mobility comments: Min guard for safety, pt able to lift LEs to EOB without physical assist. Pt momentarily lightheaded sitting EOB, passed quickly.   Transfers Overall transfer level: Needs assistance Equipment used: Rolling walker (2 wheeled) Transfers: Sit to/from Stand Sit to Stand: Min guard;From elevated surface         General transfer comment: Min guard for safety, verbal cuing for hand placement when rising.    Ambulation/Gait Ambulation/Gait assistance: Min guard;+2 safety/equipment Gait Distance (Feet): 45 Feet Assistive device: Rolling walker (2 wheeled) Gait Pattern/deviations: Step-to pattern;Decreased step length - right;Decreased step length - left Gait velocity: decr    General Gait Details: Min guard for safety, verbal cuing for safety. Pt with good placement in RW during ambulation.  Stairs            Wheelchair Mobility    Modified Rankin (Stroke Patients Only)       Balance Overall balance assessment: Mild deficits observed, not formally tested                                           Pertinent Vitals/Pain Pain Assessment: 0-10 Pain Score: 8  Pain Location: R knee, especially lateral knee Pain Descriptors / Indicators: Sore;Burning Pain Intervention(s): Monitored during session;Repositioned;Limited activity within patient's tolerance;Premedicated before session;Patient requesting pain meds-RN notified;Ice applied    Home Living Family/patient expects to be discharged to:: Private residence Living Arrangements: Spouse/significant other Available Help at Discharge: Family;Available 24 hours/day(wife for 24/7 for next 5 days) Type of Home: House Home Access: Stairs to enter Entrance Stairs-Rails: None(short steps) Entrance Stairs-Number of Steps: 2 Home Layout: One level Home Equipment: Shower seat - built in;Walker - 2 wheels;Crutches      Prior Function Level of Independence: Independent with assistive device(s)         Comments: Pt reports occasionally using crutches due to pain. Pt works at [employer]  Hand Dominance   Dominant Hand: Right    Extremity/Trunk Assessment   Upper Extremity Assessment Upper Extremity Assessment: Overall WFL for tasks assessed    Lower Extremity Assessment Lower Extremity Assessment: Overall WFL for tasks assessed;RLE deficits/detail RLE Deficits / Details: suspected post-surgical  weakness; able to perform ankle pumps, quad set, heel slide to 60*, SLR with quad lag  RLE Sensation: WNL    Cervical / Trunk Assessment Cervical / Trunk Assessment: Normal  Communication   Communication: No difficulties  Cognition Arousal/Alertness: Awake/alert Behavior During Therapy: WFL for tasks assessed/performed Overall Cognitive Status: Within Functional Limits for tasks assessed                                        General Comments      Exercises Total Joint Exercises Goniometric ROM: knee aarom ~10-60*, limited by pain    Assessment/Plan    PT Assessment Patient needs continued PT services  PT Problem List Decreased knowledge of use of DME;Decreased activity tolerance;Decreased balance;Pain;Decreased mobility;Decreased strength;Decreased range of motion       PT Treatment Interventions DME instruction;Functional mobility training;Balance training;Patient/family education;Gait training;Therapeutic activities;Stair training;Therapeutic exercise    PT Goals (Current goals can be found in the Care Plan section)  Acute Rehab PT Goals Patient Stated Goal: return to work PT Goal Formulation: With patient Time For Goal Achievement: 11/25/18 Potential to Achieve Goals: Good    Frequency 7X/week   Barriers to discharge        Co-evaluation               AM-PAC PT "6 Clicks" Mobility  Outcome Measure Help needed turning from your back to your side while in a flat bed without using bedrails?: None Help needed moving from lying on your back to sitting on the side of a flat bed without using bedrails?: None Help needed moving to and from a bed to a chair (including a wheelchair)?: A Little Help needed standing up from a chair using your arms (e.g., wheelchair or bedside chair)?: A Little Help needed to walk in hospital room?: A Little Help needed climbing 3-5 steps with a railing? : A Little 6 Click Score: 20    End of Session Equipment  Utilized During Treatment: Gait belt Activity Tolerance: Patient tolerated treatment well Patient left: in chair;with call bell/phone within reach;with nursing/sitter in room;with chair alarm set Nurse Communication: Mobility status PT Visit Diagnosis: Other abnormalities of gait and mobility (R26.89);Muscle weakness (generalized) (M62.81)    Time: 5284-1324 PT Time Calculation (min) (ACUTE ONLY): 23 min   Charges:   PT Evaluation $PT Eval Low Complexity: 1 Low PT Treatments $Gait Training: 8-22 mins       Nicola Police, PT Acute Rehabilitation Services Pager [phone]  Office [phone]   Ayoub Arey D Despina Hidden 11/11/2018, 4:29 PM

## 2018-11-11 NOTE — Anesthesia Procedure Notes (Signed)
Anesthesia Regional Block: Adductor canal block   Pre-Anesthetic Checklist: ,, timeout performed, Correct Patient, Correct Site, Correct Laterality, Correct Procedure, Correct Position, site marked, Risks and benefits discussed,  Surgical consent,  Pre-op evaluation,  At surgeon's request and post-op pain management  Laterality: Right  Prep: chloraprep       Needles:  Injection technique: Single-shot  Needle Type: Echogenic Needle     Needle Length: 9cm  Needle Gauge: 21     Additional Needles:   Narrative:  Start time: 11/11/2018 8:00 AM End time: 11/11/2018 8:06 AM Injection made incrementally with aspirations every 5 mL.  Performed by: Personally  Anesthesiologist: Achille Rich, MD  Additional Notes: Pt tolerated the procedure well.

## 2018-11-12 ENCOUNTER — Encounter (HOSPITAL_COMMUNITY): Payer: Self-pay | Admitting: Orthopedic Surgery

## 2018-11-12 LAB — CBC
HCT: 38 % — ABNORMAL LOW (ref 39.0–52.0)
Hemoglobin: 12.2 g/dL — ABNORMAL LOW (ref 13.0–17.0)
MCH: 32.7 pg (ref 26.0–34.0)
MCHC: 32.1 g/dL (ref 30.0–36.0)
MCV: 101.9 fL — ABNORMAL HIGH (ref 80.0–100.0)
Platelets: 233 10*3/uL (ref 150–400)
RBC: 3.73 MIL/uL — ABNORMAL LOW (ref 4.22–5.81)
RDW: 12.5 % (ref 11.5–15.5)
WBC: 14.3 10*3/uL — ABNORMAL HIGH (ref 4.0–10.5)
nRBC: 0 % (ref 0.0–0.2)

## 2018-11-12 LAB — BASIC METABOLIC PANEL
Anion gap: 9 (ref 5–15)
BUN: 13 mg/dL (ref 6–20)
CO2: 25 mmol/L (ref 22–32)
Calcium: 8.7 mg/dL — ABNORMAL LOW (ref 8.9–10.3)
Chloride: 107 mmol/L (ref 98–111)
Creatinine, Ser: 0.86 mg/dL (ref 0.61–1.24)
GFR calc Af Amer: >60 mL/min (ref >60)
GFR calc non Af Amer: >60 mL/min (ref >60)
Glucose, Bld: 147 mg/dL — ABNORMAL HIGH (ref 70–99)
Potassium: 4.1 mmol/L (ref 3.5–5.1)
Sodium: 141 mmol/L (ref 135–145)

## 2018-11-12 MED ORDER — METHOCARBAMOL 500 MG PO TABS: 500.0000 mg | ORAL_TABLET | Freq: Four times a day (QID) | ORAL | 1 refills | Status: DC | PRN

## 2018-11-12 MED ORDER — ASPIRIN EC 325 MG PO TBEC: 325.0000 mg | DELAYED_RELEASE_TABLET | Freq: Two times a day (BID) | ORAL | 0 refills | Status: DC

## 2018-11-12 MED ORDER — BUPIVACAINE HCL (PF) 0.5 % IJ SOLN
INTRAMUSCULAR | Status: AC
  Filled 2018-11-12: qty 30

## 2018-11-12 MED ORDER — OXYCODONE HCL 5 MG PO TABS: 5.0000 mg | ORAL_TABLET | Freq: Four times a day (QID) | ORAL | 0 refills | Status: DC | PRN

## 2018-11-12 NOTE — Anesthesia Postprocedure Evaluation (Signed)
Anesthesia Post Note  Patient: Ryan [name]  Procedure(s) Performed: TOTAL KNEE ARTHROPLASTY (Right )     Patient location during evaluation: PACU Anesthesia Type: Spinal Level of consciousness: oriented and awake and alert Pain management: pain level controlled Vital Signs Assessment: post-procedure vital signs reviewed and stable Respiratory status: spontaneous breathing, respiratory function stable and patient connected to nasal cannula oxygen Cardiovascular status: blood pressure returned to baseline and stable Postop Assessment: no headache, no backache and no apparent nausea or vomiting Anesthetic complications: no    Last Vitals:  Vitals:   11/12/18 0800 11/12/18 0946  BP: 105/61 114/73  Pulse: 68 74  Resp:  20  Temp:  36.7 C  SpO2:  95%    Last Pain:  Vitals:   11/12/18 1040  TempSrc:   PainSc: 3                  Jacquez Sheetz S

## 2018-11-12 NOTE — Evaluation (Signed)
Occupational Therapy Evaluation Patient Details Name: Ryan [name] S Cifelli MRN: [id number] DOB: 03-Dec-1965 Today's Date: 11/12/2018    History of Present Illness 53 yo male s/p R TKR on 11/11/18. PMH includes L DA-THA, anxiety, OA, DOE, GERD, afib.    Clinical Impression   Pt was admitted for the above.  He performs adls and bathroom transfers at a set up/supervision level.  No further OT is needed at this time    Follow Up Recommendations  Supervision/Assistance - 24 hour    Equipment Recommendations  None recommended by OT    Recommendations for Other Services       Precautions / Restrictions Precautions Precautions: Fall Knee Immobilizer - Right: (KI d/c'd) Restrictions Weight Bearing Restrictions: No Other Position/Activity Restrictions: WBAT      Mobility Bed Mobility               General bed mobility comments: supervision, HOB raised  Transfers   Equipment used: Rolling walker (2 wheeled)   Sit to Stand: Supervision              Balance                                           ADL either performed or assessed with clinical judgement   ADL Overall ADL's : Needs assistance/impaired                                       General ADL Comments: pt is able to perform ADLs with set up, supervision.  He tends to move quickly, but is not unsafe. Practiced shower (simulated ledge) and higher commode transfers. Reinforced no pillow under knee     Vision         Perception     Praxis      Pertinent Vitals/Pain Pain Score: 4  Pain Location: R knee Pain Descriptors / Indicators: Sore Pain Intervention(s): Limited activity within patient's tolerance;Monitored during session;Premedicated before session;Repositioned;Ice applied     Hand Dominance Right   Extremity/Trunk Assessment Upper Extremity Assessment Upper Extremity Assessment: Overall WFL for tasks assessed           Communication  Communication Communication: No difficulties   Cognition Arousal/Alertness: Awake/alert Behavior During Therapy: WFL for tasks assessed/performed Overall Cognitive Status: Within Functional Limits for tasks assessed                                     General Comments       Exercises     Shoulder Instructions      Home Living Family/patient expects to be discharged to:: Private residence Living Arrangements: Spouse/significant other Available Help at Discharge: Family;Available 24 hours/day               Bathroom Shower/Tub: Producer, television/film/videoWalk-in shower   Bathroom Toilet: Handicapped height     Home Equipment: Shower seat - built in;Grab bars - tub/shower          Prior Functioning/Environment Level of Independence: Independent with assistive device(s)        Comments: Pt reports occasionally using crutches due to pain. Pt works at [employer]         OT Problem List:  OT Treatment/Interventions:      OT Goals(Current goals can be found in the care plan section) Acute Rehab OT Goals Patient Stated Goal: return to work OT Goal Formulation: All assessment and education complete, DC therapy  OT Frequency:     Barriers to D/C:            Co-evaluation              AM-PAC OT "6 Clicks" Daily Activity     Outcome Measure Help from another person eating meals?: None Help from another person taking care of personal grooming?: A Little Help from another person toileting, which includes using toliet, bedpan, or urinal?: A Little Help from another person bathing (including washing, rinsing, drying)?: A Little Help from another person to put on and taking off regular upper body clothing?: A Little Help from another person to put on and taking off regular lower body clothing?: A Little 6 Click Score: 19   End of Session    Activity Tolerance: Patient tolerated treatment well Patient left: in bed;with call bell/phone within reach  OT Visit  Diagnosis: Pain Pain - Right/Left: Right Pain - part of body: Knee                Time: 4235-3614 OT Time Calculation (min): 18 min Charges:  OT General Charges $OT Visit: 1 Visit OT Evaluation $OT Eval Low Complexity: 1 Low  Marica Otter, OTR/L Acute Rehabilitation Services [phone] [facility] pager [phone] office 11/12/2018  Erika Slaby 11/12/2018, 10:21 AM

## 2018-11-12 NOTE — TOC Transition Note (Signed)
Transition of Care [facility]) - CM/SW Discharge Note   Patient Details  Name: Ryan [name] MRN: [id number] Date of Birth: 1966/05/23  Transition of Care (TOC) CM/SW Contact:  Armanda Heritage, RN Phone Number: 11/12/2018, 10:06 AM   Clinical Narrative:    CM spoke with pt who reported going home with outpatient PT, reports has a RW at home and does not want a 3-in-1 (reports has raised toilet seat at home).           Patient Goals and CMS Choice        Discharge Placement   Home                     Discharge Plan and Services                                     Social Determinants of Health (SDOH) Interventions     Readmission Risk Interventions No flowsheet data found.

## 2018-11-12 NOTE — Progress Notes (Signed)
Subjective: 1 Day Post-Op Procedure(s) (LRB): TOTAL KNEE ARTHROPLASTY (Right) Patient reports pain as 1 on 0-10 scale.Dressing Changed and hemovac DCd. Wound is dry. He caneasily do a straight Leg raising and wants to go Home.     Objective: Vital signs in last 24 hours: Temp:  [97.5 F (36.4 C)-98.4 F (36.9 C)] 97.6 F (36.4 C) (05/14 0602) Pulse Rate:  [50-106] 76 (05/14 0602) Resp:  [11-21] 14 (05/14 0602) BP: (95-137)/(67-106) 95/67 (05/14 0602) SpO2:  [93 %-100 %] 100 % (05/14 0602) Weight:  [108.6 kg] 108.6 kg (05/13 1157)  Intake/Output from previous day: 05/13 0701 - 05/14 0700 In: 3947.7 [P.O.:1080; I.V.:2801; IV Piggyback:66.7] Out: 5040 [Urine:4800; Drains:190; Blood:50] Intake/Output this shift: No intake/output data recorded.  Recent Labs    11/12/18 0326  HGB 12.2*   Recent Labs    11/12/18 0326  WBC 14.3*  RBC 3.73*  HCT 38.0*  PLT 233   Recent Labs    11/12/18 0326  NA 141  K 4.1  CL 107  CO2 25  BUN 13  CREATININE 0.86  GLUCOSE 147*  CALCIUM 8.7*   No results for input(s): LABPT, INR in the last 72 hours.  Neurologically intact Dorsiflexion/Plantar flexion intact No cellulitis present   Assessment/Plan: 1 Day Post-Op Procedure(s) (LRB): TOTAL KNEE ARTHROPLASTY (Right) Up with therapy   Anticipated LOS equal to or greater than 2 midnights due to - Age 52 and older with one or more of the following:  - Obesity  - Expected need for hospital services (PT, OT, Nursing) required for safe  discharge  - Anticipated need for postoperative skilled nursing care or inpatient rehab  - Active co-morbidities: None OR   - Unanticipated findings during/Post Surgery: None  - Patient is a high risk of re-admission due to: Non-elective hospital admission within previous 6 months.Will DC home with outpatient PT.    [name] 11/12/2018, 7:14 AM

## 2018-11-12 NOTE — Progress Notes (Signed)
Physical Therapy Treatment Patient Details Name: Ryan [name] MRN: [id number] DOB: [birth year] Today's Date: 11/12/2018    History of Present Illness 53 yo male s/p R TKR on 11/11/18. PMH includes L DA-THA, anxiety, OA, DOE, GERD, afib.     PT Comments    POD # 1 am session Assisted OOb to amb a greater distance in hallway.  Practiced stair. Then returned to room to perform some TE's following HEP handout.  Instructed on proper tech, freq as well as use of ICE.   Addressed all mobility questions, discussed appropriate activity, educated on use of ICE.  Pt ready for D/C to home.   Follow Up Recommendations  Follow surgeon's recommendation for DC plan and follow-up therapies;Supervision for mobility/OOB;Outpatient PT     Equipment Recommendations  None recommended by PT    Recommendations for Other Services       Precautions / Restrictions Precautions Precautions: Fall Restrictions Weight Bearing Restrictions: No Other Position/Activity Restrictions: WBAT    Mobility  Bed Mobility Overal bed mobility: Modified Independent;Needs Assistance Bed Mobility: Supine to Sit;Sit to Supine     Supine to sit: Supervision Sit to supine: Supervision   General bed mobility comments: supervision, HOB raised  Transfers Overall transfer level: Needs assistance Equipment used: Rolling walker (2 wheeled) Transfers: Sit to/from [employer] CorporationStand;Stand Pivot Transfers Sit to Stand: Supervision Stand pivot transfers: Supervision       General transfer comment: Min guard for safety, verbal cuing for hand placement when rising.   Ambulation/Gait Ambulation/Gait assistance: Supervision Gait Distance (Feet): 125 Feet Assistive device: Rolling walker (2 wheeled) Gait Pattern/deviations: Step-to pattern;Decreased step length - right;Decreased step length - left Gait velocity: decr    General Gait Details: Min guard for safety, verbal cuing for safety. Pt with good placement in RW during  ambulation.   Stairs Stairs: Yes Stairs assistance: Min guard Stair Management: Two rails Number of Stairs: 3 General stair comments: tolerated well   Wheelchair Mobility    Modified Rankin (Stroke Patients Only)       Balance                                            Cognition Arousal/Alertness: Awake/alert Behavior During Therapy: WFL for tasks assessed/performed Overall Cognitive Status: Within Functional Limits for tasks assessed                                        Exercises   Total Knee Replacement TE's 10 reps B LE ankle pumps 10 reps towel squeezes 10 reps knee presses 10 reps heel slides  10 reps SAQ's 10 reps SLR's 10 reps ABD Followed by ICE     General Comments        Pertinent Vitals/Pain Pain Assessment: 0-10 Pain Score: 2  Pain Location: R knee Pain Descriptors / Indicators: Sore Pain Intervention(s): Monitored during session;Repositioned;Premedicated before session    Home Living                      Prior Function            PT Goals (current goals can now be found in the care plan section) Progress towards PT goals: Progressing toward goals    Frequency    7X/week  PT Plan Current plan remains appropriate    Co-evaluation              AM-PAC PT "6 Clicks" Mobility   Outcome Measure  Help needed turning from your back to your side while in a flat bed without using bedrails?: None Help needed moving from lying on your back to sitting on the side of a flat bed without using bedrails?: None Help needed moving to and from a bed to a chair (including a wheelchair)?: A Little Help needed standing up from a chair using your arms (e.g., wheelchair or bedside chair)?: A Little Help needed to walk in hospital room?: A Little Help needed climbing 3-5 steps with a railing? : A Little 6 Click Score: 20    End of Session Equipment Utilized During Treatment: Gait  belt Activity Tolerance: Patient tolerated treatment well Patient left: in chair;with call bell/phone within reach;with nursing/sitter in room;with chair alarm set Nurse Communication: (pt ready for D/c) PT Visit Diagnosis: Other abnormalities of gait and mobility (R26.89);Muscle weakness (generalized) (M62.81)     Time: 7425-9563 PT Time Calculation (min) (ACUTE ONLY): 40 min  Charges:  $Gait Training: 8-22 mins $Therapeutic Exercise: 8-22 mins $Therapeutic Activity: 8-22 mins                     Felecia Shelling  PTA Acute  Rehabilitation Services Pager      [phone] Office      [phone]

## 2018-11-13 ENCOUNTER — Institutional Professional Consult (permissible substitution): Payer: BLUE CROSS/BLUE SHIELD | Admitting: Internal Medicine

## 2018-11-15 NOTE — Discharge Summary (Signed)
Physician Discharge Summary   Patient ID: JENNER ROSIER MRN: [id number] DOB/AGE: [birth year] 52 y.o.  Admit date: 11/11/2018 Discharge date: 11/12/2018  Primary Diagnosis: Primary osteoarthritis right knee   Admission Diagnoses:  Past Medical History:  Diagnosis Date  . Anxiety   . Arthritis    oa  . Chest pain since 02-11-2013   pt thinks associated with reflux  . Dyspnea    with exertion  . GERD (gastroesophageal reflux disease)   . H/O hiatal hernia    Discharge Diagnoses:   Active Problems:   H/O total knee replacement, right  Estimated body mass index is 29.94 kg/m as calculated from the following:   Height as of this encounter: 6\' 3"  (1.905 m).   Weight as of this encounter: 108.6 kg.  Procedure:  Procedure(s) (LRB): TOTAL KNEE ARTHROPLASTY (Right)   Consults: None  HPI: NECO KLING, 53 y.o. male, has a history of pain and functional disability in the right knee due to arthritis and has failed non-surgical conservative treatments for greater than 12 weeks to includeNSAID's and/or analgesics, corticosteriod injections, viscosupplementation injections, flexibility and strengthening excercises and activity modification.  Onset of symptoms was gradual, starting 2 years ago with rapidlly worsening course since that time. The patient noted prior procedures on the knee to include  arthroscopy and menisectomy on the right knee(s).  Patient currently rates pain in the right knee(s) at 9 out of 10 with activity. Patient has night pain, worsening of pain with activity and weight bearing, pain that interferes with activities of daily living, pain with passive range of motion, crepitus and joint swelling.  Patient has evidence of periarticular osteophytes and joint space narrowing by imaging studies.There is no active infection.  Laboratory Data: Admission on 11/11/2018, Discharged on 11/12/2018  Component Date Value Ref Range Status  . WBC 11/12/2018 14.3* 4.0 - 10.5 K/uL Final   . RBC 11/12/2018 3.73* 4.22 - 5.81 MIL/uL Final  . Hemoglobin 11/12/2018 12.2* 13.0 - 17.0 g/dL Final  . HCT 40/98/1191 38.0* 39.0 - 52.0 % Final  . MCV 11/12/2018 101.9* 80.0 - 100.0 fL Final  . MCH 11/12/2018 32.7  26.0 - 34.0 pg Final  . MCHC 11/12/2018 32.1  30.0 - 36.0 g/dL Final  . RDW 47/82/9562 12.5  11.5 - 15.5 % Final  . Platelets 11/12/2018 233  150 - 400 K/uL Final  . nRBC 11/12/2018 0.0  0.0 - 0.2 % Final   Performed at [facility], [address] [place], [place] [address]  . Sodium 11/12/2018 141  135 - 145 mmol/L Final  . Potassium 11/12/2018 4.1  3.5 - 5.1 mmol/L Final  . Chloride 11/12/2018 107  98 - 111 mmol/L Final  . CO2 11/12/2018 25  22 - 32 mmol/L Final  . Glucose, Bld 11/12/2018 147* 70 - 99 mg/dL Final  . BUN 57/84/6962 13  6 - 20 mg/dL Final  . Creatinine, Ser 11/12/2018 0.86  0.61 - 1.24 mg/dL Final  . Calcium 95/28/4132 8.7* 8.9 - 10.3 mg/dL Final  . GFR calc non Af Amer 11/12/2018 >60  >60 mL/min Final  . GFR calc Af Amer 11/12/2018 >60  >60 mL/min Final  . Anion gap 11/12/2018 9  5 - 15 Final   Performed at [facility], [address] [place], [place] [address]  Hospital Outpatient Visit on 11/06/2018  Component Date Value Ref Range Status  . SARS-CoV-2, NAA 11/06/2018 NOT DETECTED  NOT DETECTED Final   Comment: (NOTE) This  test was developed and its performance characteristics determined by [employer]. This test has not been FDA cleared or approved. This test has been authorized by FDA under an Emergency Use Authorization (EUA). This test is only authorized for the duration of time the declaration that circumstances exist justifying the authorization of the emergency use of in vitro diagnostic tests for detection of SARS-CoV-2 virus and/or diagnosis of COVID-19 infection under section 564(b)(1) of the Act, 21 U.S.C. 409WJX-9(J)(4), unless the authorization is terminated or revoked sooner. When  diagnostic testing is negative, the possibility of a false negative result should be considered in the context of a patient's recent exposures and the presence of clinical signs and symptoms consistent with COVID-19. An individual without symptoms of COVID-19 and who is not shedding SARS-CoV-2 virus would expect to have a negative (not detected) result in this assay. Performed                           At: [facility] [address] [place], [place] [id number] Jolene Schimke MD YQ:[id number]   . Coronavirus Source 11/06/2018 NASOPHARYNGEAL   Final   Performed at [facility], [address], [place], [place] [address]  Hospital Outpatient Visit on 11/05/2018  Component Date Value Ref Range Status  . aPTT 11/05/2018 23* 24 - 36 seconds Final   Performed at [facility], [address] [place], [place] [address]  . WBC 11/05/2018 10.7* 4.0 - 10.5 K/uL Final  . RBC 11/05/2018 4.26  4.22 - 5.81 MIL/uL Final  . Hemoglobin 11/05/2018 13.8  13.0 - 17.0 g/dL Final  . HCT 04/27/2535 42.2  39.0 - 52.0 % Final  . MCV 11/05/2018 99.1  80.0 - 100.0 fL Final  . MCH 11/05/2018 32.4  26.0 - 34.0 pg Final  . MCHC 11/05/2018 32.7  30.0 - 36.0 g/dL Final  . RDW 64/40/3474 12.6  11.5 - 15.5 % Final  . Platelets 11/05/2018 265  150 - 400 K/uL Final  . nRBC 11/05/2018 0.0  0.0 - 0.2 % Final  . Neutrophils Relative % 11/05/2018 61  % Final  . Neutro Abs 11/05/2018 6.6  1.7 - 7.7 K/uL Final  . Lymphocytes Relative 11/05/2018 22  % Final  . Lymphs Abs 11/05/2018 2.3  0.7 - 4.0 K/uL Final  . Monocytes Relative 11/05/2018 14  % Final  . Monocytes Absolute 11/05/2018 1.5* 0.1 - 1.0 K/uL Final  . Eosinophils Relative 11/05/2018 1  % Final  . Eosinophils Absolute 11/05/2018 0.1  0.0 - 0.5 K/uL Final  . Basophils Relative 11/05/2018 1  % Final  . Basophils Absolute 11/05/2018 0.1  0.0 - 0.1 K/uL Final  . Immature Granulocytes 11/05/2018 1  % Final  . Abs Immature  Granulocytes 11/05/2018 0.08* 0.00 - 0.07 K/uL Final   Performed at [facility], [address] [place], [place] [address]  . Sodium 11/05/2018 141  135 - 145 mmol/L Final  . Potassium 11/05/2018 4.3  3.5 - 5.1 mmol/L Final  . Chloride 11/05/2018 111  98 - 111 mmol/L Final  . CO2 11/05/2018 25  22 - 32 mmol/L Final  . Glucose, Bld 11/05/2018 94  70 - 99 mg/dL Final  . BUN 38/75/6433 18  6 - 20 mg/dL Final  . Creatinine, Ser 11/05/2018 1.02  0.61 - 1.24 mg/dL Final  . Calcium 29/51/8841 9.6  8.9 - 10.3 mg/dL Final  . Total Protein 11/05/2018 7.5  6.5 -  8.1 g/dL Final  . Albumin 29/52/841305/12/2018 4.7  3.5 - 5.0 g/dL Final  . AST 24/40/102705/12/2018 22  15 - 41 U/L Final  . ALT 11/05/2018 33  0 - 44 U/L Final  . Alkaline Phosphatase 11/05/2018 60  38 - 126 U/L Final  . Total Bilirubin 11/05/2018 0.6  0.3 - 1.2 mg/dL Final  . GFR calc non Af Amer 11/05/2018 >60  >60 mL/min Final  . GFR calc Af Amer 11/05/2018 >60  >60 mL/min Final  . Anion gap 11/05/2018 5  5 - 15 Final   Performed at [facility], [address] [place], [place] [address]  . Prothrombin Time 11/05/2018 13.1  11.4 - 15.2 seconds Final  . INR 11/05/2018 1.0  0.8 - 1.2 Final   Comment: (NOTE) INR goal varies based on device and disease states. Performed at [facility], [address] [place], [place] [address]   . ABO/RH(D) 11/05/2018 A POS   Final  . Antibody Screen 11/05/2018 NEG   Final  . Sample Expiration 11/05/2018 11/14/2018,2359   Final  . Extend sample reason 11/05/2018    Final                   Value:NO TRANSFUSIONS OR PREGNANCY IN THE PAST 3 MONTHS Performed at [facility], [address] [place], [place] [address]   . MRSA, PCR 11/05/2018 NEGATIVE  NEGATIVE Final  . Staphylococcus aureus 11/05/2018 NEGATIVE  NEGATIVE Final   Comment: (NOTE) The Xpert SA Assay (FDA approved for NASAL specimens in patients 52 years of age and older), is one  component of a comprehensive surveillance program. It is not intended to diagnose infection nor to guide or monitor treatment. Performed at [facility], [address] [place], [place] [address]       Hospital Course: Particia Latherroy S Bohle is a 53 y.o. who was admitted to [facility]. They were brought to the operating room on 11/11/2018 and underwent Procedure(s): TOTAL KNEE ARTHROPLASTY.  Patient tolerated the procedure well and was later transferred to the recovery room and then to the orthopaedic floor for postoperative care.  They were given PO and IV analgesics for pain control following their surgery.  They were given 24 hours of postoperative antibiotics of  Anti-infectives (From admission, onward)   Start     Dose/Rate Route Frequency Ordered Stop   11/11/18 1500  ceFAZolin (ANCEF) IVPB 1 g/50 mL premix     1 g 100 mL/hr over 30 Minutes Intravenous Every 6 hours 11/11/18 1211 11/11/18 2159   11/11/18 0947  polymyxin B 500,000 Units, bacitracin 50,000 Units in sodium chloride 0.9 % 500 mL irrigation  Status:  Discontinued       As needed 11/11/18 0947 11/11/18 1121   11/11/18 0615  ceFAZolin (ANCEF) IVPB 2g/100 mL premix     2 g 200 mL/hr over 30 Minutes Intravenous On call to O.R. 11/11/18 [phone] 11/11/18 0900     and started on DVT prophylaxis in the form of Xarelto.   PT and OT were ordered for total joint protocol.  Discharge planning consulted to help with postop disposition and equipment needs.  Patient had a good night on the evening of surgery.  They started to get up OOB with therapy on day one. Hemovac drain was pulled without difficulty.Dressing was changed and the incision was clean and dry.  The patient had progressed with therapy and meeting their goals.  Incision was  healing well.  Patient was seen in rounds and was ready to go home.   Diet: Cardiac diet Activity:WBAT Follow-up:in 2 weeks Disposition - Home Discharged Condition: stable    Discharge Instructions    Call MD / Call 911   Complete by:  As directed    If you experience chest pain or shortness of breath, CALL 911 and be transported to the hospital emergency room.  If you develope a fever above 101 F, pus (white drainage) or increased drainage or redness at the wound, or calf pain, call your surgeon's office.   Constipation Prevention   Complete by:  As directed    Drink plenty of fluids.  Prune juice may be helpful.  You may use a stool softener, such as Colace (over the counter) 100 mg twice a day.  Use MiraLax (over the counter) for constipation as needed.   Diet - low sodium heart healthy   Complete by:  As directed    Discharge instructions   Complete by:  As directed    Dr. [name] [facility] [address] [place], [place] [address] [phone]  TOTAL KNEE REPLACEMENT POSTOPERATIVE DIRECTIONS  Knee Rehabilitation, Guidelines Following Surgery  Results after knee surgery are often greatly improved when you follow the exercise, range of motion and muscle strengthening exercises prescribed by your doctor. Safety measures are also important to protect the knee from further injury. Any time any of these exercises cause you to have increased pain or swelling in your knee joint, decrease the amount until you are comfortable again and slowly increase them. If you have problems or questions, call your caregiver or physical therapist for advice.   HOME CARE INSTRUCTIONS  Remove items at home which could result in a fall. This includes throw rugs or furniture in walking pathways.  ICE to the affected knee every three hours for 30 minutes at a time and then as needed for pain and swelling.  Continue to use ice on the knee for pain and swelling from surgery. You may notice swelling that will progress down to the foot and ankle.  This is normal after surgery.  Elevate the leg when you are not up walking on it.   Continue to use the breathing machine  which will help keep your temperature down.  It is common for your temperature to cycle up and down following surgery, especially at night when you are not up moving around and exerting yourself.  The breathing machine keeps your lungs expanded and your temperature down. Do not place pillow under knee, focus on keeping the knee straight while resting  DIET You may resume your previous home diet once your are discharged from the hospital.  DRESSING / WOUND CARE / SHOWERING You may shower 3 days after surgery, but keep the wounds dry during showering.  You may use an occlusive plastic wrap (Press'n Seal for example), NO SOAKING/SUBMERGING IN THE BATHTUB.  If the bandage gets wet, change with a clean dry gauze.  If the incision gets wet, pat the wound dry with a clean towel. You may start showering once you are discharged home but do not submerge the incision under water. Just pat the incision dry and apply a dry gauze dressing on daily. Change the surgical dressing daily and reapply a dry dressing each time.  ACTIVITY Walk with your walker as instructed. Use walker as long as suggested by your caregivers. Avoid periods of inactivity such as sitting longer than an hour  when not asleep. This helps prevent blood clots.  You may resume a sexual relationship in one month or when given the OK by your doctor.  You may return to work once you are cleared by your doctor.  Do not drive a car for 6 weeks or until released by you surgeon.  Do not drive while taking narcotics.  WEIGHT BEARING Weight bearing as tolerated with assist device (walker, cane, etc) as directed, use it as long as suggested by your surgeon or therapist, typically at least 4-6 weeks.  POSTOPERATIVE CONSTIPATION PROTOCOL Constipation - defined medically as fewer than three stools per week and severe constipation as less than one stool per week.  One of the most common issues patients have following surgery is constipation.  Even if  you have a regular bowel pattern at home, your normal regimen is likely to be disrupted due to multiple reasons following surgery.  Combination of anesthesia, postoperative narcotics, change in appetite and fluid intake all can affect your bowels.  In order to avoid complications following surgery, here are some recommendations in order to help you during your recovery period.  Colace (docusate) - Pick up an over-the-counter form of Colace or another stool softener and take twice a day as long as you are requiring postoperative pain medications.  Take with a full glass of water daily.  If you experience loose stools or diarrhea, hold the colace until you stool forms back up.  If your symptoms do not get better within 1 week or if they get worse, check with your doctor.  Dulcolax (bisacodyl) - Pick up over-the-counter and take as directed by the product packaging as needed to assist with the movement of your bowels.  Take with a full glass of water.  Use this product as needed if not relieved by Colace only.   MiraLax (polyethylene glycol) - Pick up over-the-counter to have on hand.  MiraLax is a solution that will increase the amount of water in your bowels to assist with bowel movements.  Take as directed and can mix with a glass of water, juice, soda, coffee, or tea.  Take if you go more than two days without a movement. Do not use MiraLax more than once per day. Call your doctor if you are still constipated or irregular after using this medication for 7 days in a row.  If you continue to have problems with postoperative constipation, please contact the office for further assistance and recommendations.  If you experience "the worst abdominal pain ever" or develop nausea or vomiting, please contact the office immediatly for further recommendations for treatment.  ITCHING  If you experience itching with your medications, try taking only a single pain pill, or even half a pain pill at a time.  You can  also use Benadryl over the counter for itching or also to help with sleep.   TED HOSE STOCKINGS Wear the elastic stockings on both legs for three weeks following surgery during the day but you may remove then at night for sleeping.  MEDICATIONS See your medication summary on the "After Visit Summary" that the nursing staff will review with you prior to discharge.  You may have some home medications which will be placed on hold until you complete the course of blood thinner medication.  It is important for you to complete the blood thinner medication as prescribed by your surgeon.  Continue your approved medications as instructed at time of discharge.  PRECAUTIONS If you experience chest pain  or shortness of breath - call 911 immediately for transfer to the hospital emergency department.  If you develop a fever greater that 101 F, purulent drainage from wound, increased redness or drainage from wound, foul odor from the wound/dressing, or calf pain - CONTACT YOUR SURGEON.                                                   FOLLOW-UP APPOINTMENTS Make sure you keep all of your appointments after your operation with your surgeon and caregivers. You should call the office at the above phone number and make an appointment for approximately two weeks after the date of your surgery or on the date instructed by your surgeon outlined in the "After Visit Summary".   RANGE OF MOTION AND STRENGTHENING EXERCISES  Rehabilitation of the knee is important following a knee injury or an operation. After just a few days of immobilization, the muscles of the thigh which control the knee become weakened and shrink (atrophy). Knee exercises are designed to build up the tone and strength of the thigh muscles and to improve knee motion. Often times heat used for twenty to thirty minutes before working out will loosen up your tissues and help with improving the range of motion but do not use heat for the first two weeks  following surgery. These exercises can be done on a training (exercise) mat, on the floor, on a table or on a bed. Use what ever works the best and is most comfortable for you Knee exercises include:  Leg Lifts - While your knee is still immobilized in a splint or cast, you can do straight leg raises. Lift the leg to 60 degrees, hold for 3 sec, and slowly lower the leg. Repeat 10-20 times 2-3 times daily. Perform this exercise against resistance later as your knee gets better.  Quad and Hamstring Sets - Tighten up the muscle on the front of the thigh (Quad) and hold for 5-10 sec. Repeat this 10-20 times hourly. Hamstring sets are done by pushing the foot backward against an object and holding for 5-10 sec. Repeat as with quad sets.  Leg Slides: Lying on your back, slowly slide your foot toward your buttocks, bending your knee up off the floor (only go as far as is comfortable). Then slowly slide your foot back down until your leg is flat on the floor again. Angel Wings: Lying on your back spread your legs to the side as far apart as you can without causing discomfort.  A rehabilitation program following serious knee injuries can speed recovery and prevent re-injury in the future due to weakened muscles. Contact your doctor or a physical therapist for more information on knee rehabilitation.   IF YOU ARE TRANSFERRED TO A SKILLED REHAB FACILITY If the patient is transferred to a skilled rehab facility following release from the hospital, a list of the current medications will be sent to the facility for the patient to continue.  When discharged from the skilled rehab facility, please have the facility set up the patient's Home Health Physical Therapy prior to being released. Also, the skilled facility will be responsible for providing the patient with their medications at time of release from the facility to include their pain medication, the muscle relaxants, and their blood thinner medication. If the patient  is still at the rehab  facility at time of the two week follow up appointment, the skilled rehab facility will also need to assist the patient in arranging follow up appointment in our office and any transportation needs.  MAKE SURE YOU:  Understand these instructions.  Get help right away if you are not doing well or get worse.    Pick up stool softner and laxative for home use following surgery while on pain medications. Do not submerge incision under water. Please use good hand washing techniques while changing dressing each day. May shower starting three days after surgery. Please use a clean towel to pat the incision dry following showers. Continue to use ice for pain and swelling after surgery. Do not use any lotions or creams on the incision until instructed by your surgeon.   Increase activity slowly as tolerated   Complete by:  As directed      Allergies as of 11/12/2018   No Known Allergies     Medication List    STOP taking these medications   HYDROcodone-acetaminophen 5-325 MG tablet Commonly known as:  NORCO/VICODIN     TAKE these medications   aspirin EC 325 MG tablet Take 1 tablet (325 mg total) by mouth 2 (two) times a day.   atenolol 25 MG tablet Commonly known as:  TENORMIN Take 25 mg by mouth daily.   gabapentin 300 MG capsule Commonly known as:  NEURONTIN Take 300 mg by mouth at bedtime.   methocarbamol 500 MG tablet Commonly known as:  ROBAXIN Take 1 tablet (500 mg total) by mouth every 6 (six) hours as needed for muscle spasms.   oxyCODONE 5 MG immediate release tablet Commonly known as:  Oxy IR/ROXICODONE Take 1-2 tablets (5-10 mg total) by mouth every 6 (six) hours as needed for moderate pain or severe pain.   pantoprazole 40 MG tablet Commonly known as:  PROTONIX Take 1 tablet (40 mg total) by mouth daily.      Follow-up Information    [name], MD. Schedule an appointment as soon as possible for a visit on 11/26/2018.   Specialty:   Orthopedic Surgery Contact information: [address] [place] [address] [place] [address] [phone]           Signed: Dimitri Ped, PA-C Orthopaedic Surgery 11/15/2018, 9:40 PM

## 2018-11-16 DIAGNOSIS — Z5189 Encounter for other specified aftercare: Secondary | ICD-10-CM | POA: Diagnosis not present

## 2018-11-16 DIAGNOSIS — M25561 Pain in right knee: Secondary | ICD-10-CM | POA: Diagnosis not present

## 2018-11-16 DIAGNOSIS — M25661 Stiffness of right knee, not elsewhere classified: Secondary | ICD-10-CM | POA: Diagnosis not present

## 2018-11-18 DIAGNOSIS — M25661 Stiffness of right knee, not elsewhere classified: Secondary | ICD-10-CM | POA: Diagnosis not present

## 2018-11-18 DIAGNOSIS — M25561 Pain in right knee: Secondary | ICD-10-CM | POA: Diagnosis not present

## 2018-11-24 DIAGNOSIS — M25561 Pain in right knee: Secondary | ICD-10-CM | POA: Diagnosis not present

## 2018-11-24 DIAGNOSIS — M25661 Stiffness of right knee, not elsewhere classified: Secondary | ICD-10-CM | POA: Diagnosis not present

## 2018-11-30 DIAGNOSIS — M25661 Stiffness of right knee, not elsewhere classified: Secondary | ICD-10-CM | POA: Diagnosis not present

## 2018-11-30 DIAGNOSIS — M25561 Pain in right knee: Secondary | ICD-10-CM | POA: Diagnosis not present

## 2018-12-03 ENCOUNTER — Telehealth: Payer: Self-pay | Admitting: Internal Medicine

## 2018-12-03 ENCOUNTER — Encounter: Payer: Self-pay | Admitting: Internal Medicine

## 2018-12-03 ENCOUNTER — Other Ambulatory Visit: Payer: Self-pay

## 2018-12-03 ENCOUNTER — Ambulatory Visit (INDEPENDENT_AMBULATORY_CARE_PROVIDER_SITE_OTHER): Payer: BC Managed Care – PPO | Admitting: Internal Medicine

## 2018-12-03 VITALS — BP 116/70 | HR 108 | Temp 97.4°F | Ht 75.0 in | Wt 241.6 lb

## 2018-12-03 DIAGNOSIS — R0609 Other forms of dyspnea: Secondary | ICD-10-CM

## 2018-12-03 DIAGNOSIS — G4733 Obstructive sleep apnea (adult) (pediatric): Secondary | ICD-10-CM | POA: Diagnosis not present

## 2018-12-03 DIAGNOSIS — R0600 Dyspnea, unspecified: Secondary | ICD-10-CM | POA: Insufficient documentation

## 2018-12-03 DIAGNOSIS — R0683 Snoring: Secondary | ICD-10-CM | POA: Diagnosis not present

## 2018-12-03 NOTE — Patient Instructions (Signed)
Schedule sleep study  Dx OSA  Order- schedule PFT   Dx Dyspnea on exertion

## 2018-12-03 NOTE — Assessment & Plan Note (Signed)
Activity has been limited waiting for knee surgery, but he citsess 30 yr smoking hx and is concerned he may have COPD CXR 07/27/18- no active cardiopulmonary banl. Known AFib. Plan- schedule PFT

## 2018-12-03 NOTE — Telephone Encounter (Signed)
I attempted to call pt to set up his 3-4 month f/u w/ CY. Pt was seen in office today 12/03/2018 and, at the time of his discharge, the Epic system was down. I have left a message for pt to call back.

## 2018-12-03 NOTE — Progress Notes (Signed)
12/03/2018- 53 yo M former smoker referred by Grace BushyHayden Morrison FNP for new onset A-fib & snoring. Son is ER MD for [employer] in [place]. Medical problem list includes Overweight, depression, GERD, DDD w scoliosis, Allergic rhinitis Body weight today 241 lbs Epworth score 8 Wakes up gasping, often with tachypalpitation, some substernal presssure, but no chest pain.  Smoked for 30 yearss, aware of DOE and asks evaluation for COPD. ENT-septoplasty Sleep fragmented by knee pain and nocturia. Hx of multiple orthopedic procedures.  Some difficulty swallowing with sense of food hangup- has had EGD   Prior to Admission medications   Medication Sig Start Date End Date Taking? Authorizing Provider  aspirin EC 325 MG tablet Take 1 tablet (325 mg total) by mouth 2 (two) times a day. 11/12/18  Yes Constable, Amber, PA-C  atenolol (TENORMIN) 25 MG tablet Take 25 mg by mouth daily.   Yes [provider]  gabapentin (NEURONTIN) 300 MG capsule Take 300 mg by mouth at bedtime.   Yes [provider]  methocarbamol (ROBAXIN) 500 MG tablet Take 1 tablet (500 mg total) by mouth every 6 (six) hours as needed for muscle spasms. 11/12/18  Yes Constable, Amber, PA-C  oxyCODONE (OXY IR/ROXICODONE) 5 MG immediate release tablet Take 1-2 tablets (5-10 mg total) by mouth every 6 (six) hours as needed for moderate pain or severe pain. 11/12/18  Yes Constable, Amber, PA-C  pantoprazole (PROTONIX) 40 MG tablet Take 1 tablet (40 mg total) by mouth daily. 07/27/18  Yes Ward, Layla MawKristen N, DO   Past Medical History:  Diagnosis Date  . Anxiety   . Arthritis    oa  . Chest pain since 02-11-2013   pt thinks associated with reflux  . Dyspnea    with exertion  . GERD (gastroesophageal reflux disease)   . H/O hiatal hernia    Past Surgical History:  Procedure Laterality Date  . ANKLE SURGERY Right    reconstruction to muscle  . ESOPHAGEAL MANOMETRY N/A 04/17/2015   Procedure: ESOPHAGEAL MANOMETRY (EM);  Surgeon:  Charna ElizabethJyothi Mann, MD;  Location: [facility];  Service: Endoscopy;  Laterality: N/A;  . ESOPHAGOGASTRODUODENOSCOPY  11/09/2003  . FOOT SURGERY     right  . HIP SURGERY  age 51   left, done x 2  . KNEE ARTHROSCOPY Left 10/20/2003 and 2012  . MYRINGOTOMY WITH TUBE PLACEMENT Right 08/29/2015   Procedure: MYRINGOTOMY WITH TUBE PLACEMENT;  Surgeon: Newman PiesSu Teoh, MD;  Location: [facility];  Service: ENT;  Laterality: Right;  . PH IMPEDANCE STUDY N/A 04/17/2015   Procedure: PH IMPEDANCE STUDY;  Surgeon: Charna ElizabethJyothi Mann, MD;  Location: [facility];  Service: Endoscopy;  Laterality: N/A;  . SEPTOPLASTY N/A 08/29/2015   Procedure: SEPTOPLASTY;  Surgeon: Newman PiesSu Teoh, MD;  Location: [facility];  Service: ENT;  Laterality: N/A;  . TOTAL HIP ARTHROPLASTY Left 02/23/2013   Procedure: LEFT TOTAL HIP ARTHROPLASTY ANTERIOR APPROACH;  Surgeon: Shelda PalMatthew D Olin, MD;  Location: [facility];  Service: Orthopedics;  Laterality: Left;  . TOTAL KNEE ARTHROPLASTY Right 11/11/2018   Procedure: TOTAL KNEE ARTHROPLASTY;  Surgeon: Ranee GosselinGioffre, Ronald, MD;  Location: [facility];  Service: Orthopedics;  Laterality: Right;  120min  . TURBINATE REDUCTION Bilateral 08/29/2015   Procedure: TURBINATE REDUCTION;  Surgeon: Newman PiesSu Teoh, MD;  Location: [facility];  Service: ENT;  Laterality: Bilateral;  . URETHRA SURGERY   144yrs ago   for scar tissue   History reviewed. No pertinent family history. Social History   Socioeconomic History  .  Marital status: Married    Spouse name: Not on file  . Number of children: Not on file  . Years of education: Not on file  . Highest education level: Not on file  Occupational History  . Not on file  Social Needs  . Financial resource strain: Not on file  . Food insecurity:    Worry: Not on file    Inability: Not on file  . Transportation needs:    Medical: Not on file    Non-medical: Not on file  Tobacco Use  . Smoking status: Former Smoker    Packs/day: 1.50    Years: 25.00     Pack years: 37.50    Types: Cigarettes    Last attempt to quit: 05/29/2017    Years since quitting: 1.5  . Smokeless tobacco: Never Used  . Tobacco comment: quit 2018  Substance and Sexual Activity  . Alcohol use: Yes    Comment: ocassionally  . Drug use: Yes    Types: Marijuana    Comment: occ marijuana last used 1 week ago  . Sexual activity: Not on file  Lifestyle  . Physical activity:    Days per week: Not on file    Minutes per session: Not on file  . Stress: Not on file  Relationships  . Social connections:    Talks on phone: Not on file    Gets together: Not on file    Attends religious service: Not on file    Active member of club or organization: Not on file    Attends meetings of clubs or organizations: Not on file    Relationship status: Not on file  . Intimate partner violence:    Fear of current or ex partner: Not on file    Emotionally abused: Not on file    Physically abused: Not on file    Forced sexual activity: Not on file  Other Topics Concern  . Not on file  Social History Narrative  . Not on file   ROS-see HPI   + = positive Constitutional:    weight loss, night sweats, fevers, chills, fatigue, lassitude. HEENT:    headaches, +difficulty swallowing, tooth/dental problems, sore throat,       sneezing, itching, ear ache, nasal congestion, post nasal drip, snoring CV:    chest pain, orthopnea, PND, swelling in lower extremities, anasarca,                                  dizziness, palpitations Resp:   +shortness of breath with exertion or at rest.                productive cough,   non-productive cough, coughing up of blood.              change in color of mucus.  wheezing.   Skin:    rash or lesions. GI:+   heartburn, indigestion, abdominal pain, nausea, vomiting, diarrhea,                 change in bowel habits, loss of appetite GU: dysuria, change in color of urine, no urgency or frequency.   flank pain. MS:   +joint pain, stiffness, decreased  range of motion, back pain. Neuro-     nothing unusual Psych:  change in mood or affect.  +depression or anxiety.   memory loss.  OBJ- Physical Exam General- Alert, Oriented, Affect-appropriate, Distress- none acute  Skin- + tatoos Lymphadenopathy- none Head- atraumatic            Eyes- Gross vision intact, PERRLA, conjunctivae and secretions clear            Ears- Hearing, canals-normal            Nose- Clear, no-Septal dev, mucus, polyps, erosion, perforation             Throat- Mallampati II , mucosa clear , drainage- none, tonsils- atrophic Neck- flexible , trachea midline, no stridor , thyroid nl, carotid no bruit Chest - symmetrical excursion , unlabored           Heart/CV- IRR/ Afib , no murmur , no gallop  , no rub, nl s1 s2                           - JVD- none , edema- none, stasis changes- none, varices- none           Lung- clear to P&A, wheeze- none, cough- none , dullness-none, rub- none           Chest wall-  Abd-  Br/ Gen/ Rectal- Not done, not indicated Extrem- + recent incision scar R TKR Neuro- grossly intact to observation

## 2018-12-03 NOTE — Assessment & Plan Note (Signed)
History suggests OSA. Appropriate discussion and education done. Driving safety responsibility. Plan- sleep study

## 2018-12-04 NOTE — Telephone Encounter (Signed)
Called pt to set up 4 mo f/u w/ CY. Pt agreed to scheduling f/u appt for Tue 04/06/2019 at 10:30 AM. I verified pt's address w/ pt and let him know we will be sending his AVS in the mail. Pt expressed understanding.   Will keep this encounter open to f/u on as pt needs to be scheduled for PFT. As of now, the office is limiting PFTs so I will check with supervising staff to see when this could be scheduled.

## 2018-12-08 DIAGNOSIS — M25561 Pain in right knee: Secondary | ICD-10-CM | POA: Diagnosis not present

## 2018-12-08 DIAGNOSIS — M25661 Stiffness of right knee, not elsewhere classified: Secondary | ICD-10-CM | POA: Diagnosis not present

## 2018-12-08 DIAGNOSIS — Z5189 Encounter for other specified aftercare: Secondary | ICD-10-CM | POA: Diagnosis not present

## 2018-12-10 DIAGNOSIS — M25561 Pain in right knee: Secondary | ICD-10-CM | POA: Diagnosis not present

## 2018-12-10 DIAGNOSIS — M12271 Villonodular synovitis (pigmented), right ankle and foot: Secondary | ICD-10-CM | POA: Diagnosis not present

## 2018-12-10 DIAGNOSIS — M12272 Villonodular synovitis (pigmented), left ankle and foot: Secondary | ICD-10-CM | POA: Diagnosis not present

## 2018-12-10 DIAGNOSIS — M2011 Hallux valgus (acquired), right foot: Secondary | ICD-10-CM | POA: Diagnosis not present

## 2018-12-10 DIAGNOSIS — M25661 Stiffness of right knee, not elsewhere classified: Secondary | ICD-10-CM | POA: Diagnosis not present

## 2018-12-10 DIAGNOSIS — M2012 Hallux valgus (acquired), left foot: Secondary | ICD-10-CM | POA: Diagnosis not present

## 2018-12-14 DIAGNOSIS — M25661 Stiffness of right knee, not elsewhere classified: Secondary | ICD-10-CM | POA: Diagnosis not present

## 2018-12-14 DIAGNOSIS — M25561 Pain in right knee: Secondary | ICD-10-CM | POA: Diagnosis not present

## 2018-12-15 ENCOUNTER — Ambulatory Visit: Payer: BC Managed Care – PPO

## 2018-12-15 DIAGNOSIS — G4733 Obstructive sleep apnea (adult) (pediatric): Secondary | ICD-10-CM

## 2018-12-15 NOTE — Telephone Encounter (Signed)
Routing to [facility] per recent PFT protocol. This pt already has a f/u appt scheduled, he just needs a full (1hr) PFT scheduled. Order has already been placed. Thank you.

## 2018-12-16 DIAGNOSIS — M25661 Stiffness of right knee, not elsewhere classified: Secondary | ICD-10-CM | POA: Diagnosis not present

## 2018-12-16 DIAGNOSIS — M25561 Pain in right knee: Secondary | ICD-10-CM | POA: Diagnosis not present

## 2018-12-17 ENCOUNTER — Telehealth: Payer: Self-pay

## 2018-12-17 NOTE — Telephone Encounter (Signed)

## 2018-12-18 DIAGNOSIS — G4733 Obstructive sleep apnea (adult) (pediatric): Secondary | ICD-10-CM | POA: Diagnosis not present

## 2018-12-22 ENCOUNTER — Telehealth: Payer: Self-pay | Admitting: Internal Medicine

## 2018-12-22 ENCOUNTER — Encounter: Payer: Self-pay | Admitting: Internal Medicine

## 2018-12-22 DIAGNOSIS — M25561 Pain in right knee: Secondary | ICD-10-CM | POA: Diagnosis not present

## 2018-12-22 DIAGNOSIS — M25661 Stiffness of right knee, not elsewhere classified: Secondary | ICD-10-CM | POA: Diagnosis not present

## 2018-12-22 NOTE — H&P (View-Only) (Signed)
Cardiology Office Note:    Date:  12/23/2018   ID:  Ryan [name], DOB [birth year], MRN [id number]  [place]P:  [place], [facility]  Cardiologist:  No primary care provider on file.  Electrophysiologist:  None   Referring MD: Trisha MangleMorrison, Hayden Byrd, *   Chief Complaint: Atrial fibrillation; chest pain.   History of Present Illness:    Ryan [name] is a 53 y.o. male with a hx of overweight, depression, GERD, allergies with rhinitis, snoring with current sleep evaluation and recently documented atrial fibrillation.  He presents today for further evaluation of atrial fibrillation, and while obtaining the history patient endorses severe chest pain.  Patient notes chest pain when he eats or when he walks around.  He notes that when he eats it intensifies the pain to an excruciating level.  He has to get on his hands and knees and feels a squeezing sensation on the left side of his chest.  He has known acid reflux and feels that the sensation is different from his typical GERD.  The pain ramps up in 2 to 3 minutes and when he drops to his hands and knees it will take 30 seconds to remit.  After that he feels his chest is bruised.  It has occurred intermittently over the past several months but has been happening more frequently 2 times in the past 5 weeks.  He notes now that he will become diaphoretic just doing the dishes and will become diaphoretic even with just 4 METS of activity or fast walking. Does not worsen with deep breathing.   First note of atrial fibrillation in January 2020.  Palpitations: will occur not daily but several times a month, feels fleeting, seconds to minutes.  Caffeine: drinks tea all day and one cup coffee Alcohol: rare Water intake: none Snoring: snores - sleep test underway TSH: not checked yet Herbal supplements/diet products: none Syncope/presyncope: no  Constipation and GERD history. Has hiatal hernia.   1-2 ppd 30 years smoker quit 2 years  ago.    The patient denies  dyspnea at rest or with exertion, PND, orthopnea, or leg swelling. Denies syncope or presyncope. Denies dizziness or lightheadedness. Endorses snoring and is currently being evaluated for sleep apnea.    Past Medical History:  Diagnosis Date  . Anxiety   . Arthritis    oa  . Chest pain since 02-11-2013   pt thinks associated with reflux  . Dyspnea    with exertion  . GERD (gastroesophageal reflux disease)   . H/O hiatal hernia     Past Surgical History:  Procedure Laterality Date  . ANKLE SURGERY Right    reconstruction to muscle  . ESOPHAGEAL MANOMETRY N/A 04/17/2015   Procedure: ESOPHAGEAL MANOMETRY (EM);  Surgeon: Charna ElizabethJyothi Mann, MD;  [facility];  Service: Endoscopy;  Laterality: N/A;  . ESOPHAGOGASTRODUODENOSCOPY  11/09/2003  . FOOT SURGERY     right  . HIP SURGERY  age 39   left, done x 2  . KNEE ARTHROSCOPY Left 10/20/2003 and 2012  . MYRINGOTOMY WITH TUBE PLACEMENT Right 08/29/2015   Procedure: MYRINGOTOMY WITH TUBE PLACEMENT;  Surgeon: Newman PiesSu Teoh, MD;  Location: [facility];  Service: ENT;  Laterality: Right;  . PH IMPEDANCE STUDY N/A 04/17/2015   Procedure: PH IMPEDANCE STUDY;  Surgeon: Charna ElizabethJyothi Mann, MD;  [facility];  Service: Endoscopy;  Laterality: N/A;  . SEPTOPLASTY N/A 08/29/2015   Procedure: SEPTOPLASTY;  Surgeon: Newman PiesSu Teoh, MD;  Location: [facility];  Service: ENT;  Laterality: N/A;  . TOTAL HIP ARTHROPLASTY Left 02/23/2013   Procedure: LEFT TOTAL HIP ARTHROPLASTY ANTERIOR APPROACH;  Surgeon: Mauri Pole, MD;  [facility];  Service: Orthopedics;  Laterality: Left;  . TOTAL KNEE ARTHROPLASTY Right 11/11/2018   Procedure: TOTAL KNEE ARTHROPLASTY;  Surgeon: Latanya Maudlin, MD;  Location: [facility];  Service: Orthopedics;  Laterality: Right;  135min  . TURBINATE REDUCTION Bilateral 08/29/2015   Procedure: TURBINATE REDUCTION;  Surgeon: Leta Baptist, MD;  Location: [facility];  Service:  ENT;  Laterality: Bilateral;  . URETHRA SURGERY   76yrs ago   for scar tissue    Current Medications: Current Meds  Medication Sig  . gabapentin (NEURONTIN) 300 MG capsule Take 300 mg by mouth at bedtime.  Marland Kitchen oxyCODONE (OXY IR/ROXICODONE) 5 MG immediate release tablet Take 1-2 tablets (5-10 mg total) by mouth every 6 (six) hours as needed for moderate pain or severe pain.  . pantoprazole (PROTONIX) 40 MG tablet Take 1 tablet (40 mg total) by mouth daily.  . [DISCONTINUED] aspirin EC 325 MG tablet Take 1 tablet (325 mg total) by mouth 2 (two) times a day.  . [DISCONTINUED] aspirin EC 81 MG tablet Take 81 mg by mouth daily.  . [DISCONTINUED] atenolol (TENORMIN) 25 MG tablet Take 25 mg by mouth daily.  . [DISCONTINUED] methocarbamol (ROBAXIN) 500 MG tablet Take 1 tablet (500 mg total) by mouth every 6 (six) hours as needed for muscle spasms.     Allergies:   Patient has no known allergies.   Social History   Socioeconomic History  . Marital status: Married    Spouse name: Not on file  . Number of children: Not on file  . Years of education: Not on file  . Highest education level: Not on file  Occupational History  . Not on file  Social Needs  . Financial resource strain: Not on file  . Food insecurity    Worry: Not on file    Inability: Not on file  . Transportation needs    Medical: Not on file    Non-medical: Not on file  Tobacco Use  . Smoking status: Former Smoker    Packs/day: 1.50    Years: 25.00    Pack years: 37.50    Types: Cigarettes    Quit date: 05/29/2017    Years since quitting: 1.5  . Smokeless tobacco: Never Used  . Tobacco comment: quit 2018  Substance and Sexual Activity  . Alcohol use: Yes    Comment: ocassionally  . Drug use: Yes    Types: Marijuana    Comment: occ marijuana last used 1 week ago  . Sexual activity: Not on file  Lifestyle  . Physical activity    Days per week: Not on file    Minutes per session: Not on file  . Stress: Not on  file  Relationships  . Social Herbalist on phone: Not on file    Gets together: Not on file    Attends religious service: Not on file    Active member of club or organization: Not on file    Attends meetings of clubs or organizations: Not on file    Relationship status: Not on file  Other Topics Concern  . Not on file  Social History Narrative  . Not on file     Family History: The patient's family history is unremarkable for sudden cardiac death. No significant MI history patient  reports.   ROS:   Please see the history of present illness.    All other systems reviewed and are negative.  EKGs/Labs/Other Studies Reviewed:    The following studies were reviewed today:  EKG:  11/05/2018  - afib 12/23/2018 - atrial fibrillation, rate 84 bpm  Recent Labs: 11/05/2018: ALT 33 11/12/2018: BUN 13; Creatinine, Ser 0.86; Hemoglobin 12.2; Platelets 233; Potassium 4.1; Sodium 141  Recent Lipid Panel No results found for: CHOL, TRIG, HDL, CHOLHDL, VLDL, LDLCALC, LDLDIRECT  Physical Exam:    VS:  BP 137/82   Pulse 84   Temp 97.9 F (36.6 C)   Ht 6\' 3"  (1.905 m)   Wt 239 lb (108.4 kg)   SpO2 98%   BMI 29.87 kg/m     Wt Readings from Last 5 Encounters:  12/23/18 239 lb (108.4 kg)  12/03/18 241 lb 9.6 oz (109.6 kg)  11/11/18 239 lb 8 oz (108.6 kg)  11/05/18 239 lb 8 oz (108.6 kg)  07/27/18 248 lb (112.5 kg)     Constitutional: No acute distress Eyes: sclera non-icteric, normal conjunctiva and lids ENMT: normal dentition, moist mucous membranes Cardiovascular: regular rhythm, normal rate, no murmurs. S1 and S2 normal. Radial pulses normal bilaterally. No jugular venous distention.  Respiratory: clear to auscultation bilaterally GI : normal bowel sounds, soft and nontender. No distention.   MSK: extremities warm, well perfused. No edema.  NEURO: grossly nonfocal exam, moves all extremities. PSYCH: alert and oriented x 3, normal mood and affect.    ASSESSMENT:     1. Unstable angina (HCC)   2. Snoring   3. Dyspnea on exertion   4. Atrial fibrillation, unspecified type [facility](HCC)    PLAN:    Coronary angiography for unstable angina recommended. Discussed with patient, shared decision making and he agrees to proceed. In addition to concerns about unstable symptoms, CT coronary angiography may be nondiagnostic due to atrial fibrillation.  INFORMED CONSENT: I have reviewed the risks, indications, and alternatives to cardiac catheterization, possible angioplasty, and stenting with the patient. Risks include but are not limited to bleeding, infection, vascular injury, stroke, myocardial infection, arrhythmia, kidney injury, radiation-related injury in the case of prolonged fluoroscopy use, emergency cardiac surgery, and death. The patient understands the risks of serious complication is 1-2 in 1000 with diagnostic cardiac cath and 1-2% or less with angioplasty/stenting.   Afib - discussed natural history and etiologies - will obtain an echo and he will follow up with sleep clinic since he has had a sleep study. We discussed role of OSA in afib, and discussed lifestyle modification to benefit afib.    TIME SPENT WITH PATIENT: 60 minutes of direct patient care. More than 50% of that time was spent on coordination of care and counseling regarding unstable angina, afib natural history, and consent for coronary angiography.  Weston BrassGayatri Leanora Murin, MD [place]  [facility]   Medication Adjustments/Labs and Tests Ordered: Current medicines are reviewed at length with the patient today.  Concerns regarding medicines are outlined above.  Orders Placed This Encounter  Procedures  . CBC with Differential/Platelet  . TSH  . Comprehensive metabolic panel  . EKG 12-Lead  . ECHOCARDIOGRAM COMPLETE   No orders of the defined types were placed in this encounter.   Patient Instructions  Medication Instructions:  TAKE ASPIRIN MORNING OF CATH   If you need a refill  on your cardiac medications before your next appointment, please call your pharmacy.   Lab work: CBC/TSH/CMET TODAY GO FOR YOUR  COVID TEST ON Friday AT 10:40 If you have labs (blood work) drawn today and your tests are completely normal, you will receive your results only by: Marland Kitchen. MyChart Message (if you have MyChart) OR . A paper copy in the mail If you have any lab test that is abnormal or we need to change your treatment, we will call you to review the results.  Testing/Procedures: Your physician has requested that you have an echocardiogram. Echocardiography is a painless test that uses sound waves to create images of your heart. It provides your doctor with information about the size and shape of your heart and how well your heart's chambers and valves are working. This procedure takes approximately one hour. There are no restrictions for this procedure. [facility] AT [address]  Your physician has requested that you have a cardiac catheterization. Cardiac catheterization is used to diagnose and/or treat various heart conditions. Doctors may recommend this procedure for a number of different reasons. The most common reason is to evaluate chest pain. Chest pain can be a symptom of coronary artery disease (CAD), and cardiac catheterization can show whether plaque is narrowing or blocking your heart's arteries. This procedure is also used to evaluate the valves, as well as measure the blood flow and oxygen levels in different parts of your heart. For further information please visit [email or web address] Please follow instruction sheet, as given.   Your physician recommends that you schedule a follow-up appointment in: IN OFFICE WITH DR Jacques NavyACHARYA AFTER CATH AND ECHO    If you need a refill on your cardiac medications before your next appointment, please call your pharmacy.       [facility] [address] [place] [address] Dept: [phone] Loc: [phone]  Ryan [name]  12/23/2018  You are scheduled for a Cardiac Catheterization on Tuesday, June 30 with Dr. Nicki Guadalajarahomas Kelly.  1. [facility] at [facility]: [address] [place] [address] at 5:30 AM (This time is two hours before your procedure to ensure your preparation). Free valet parking service is available.   Special note: Every effort is made to have your procedure done on time. Please understand that emergencies sometimes delay scheduled procedures.  2. Diet: Do not eat solid foods after midnight.  The patient may have clear liquids until 5am upon the day of the procedure.  3. Labs:TODAY   4. Medication instructions in preparation for your procedure:   Contrast Allergy: No  On the morning of your procedure, take your Aspirin and any morning medicines NOT listed above.  You may use sips of water.  5. Plan for one night stay--bring personal belongings. 6. Bring a current list of your medications and current insurance cards. 7. You MUST have a responsible person to drive you home. 8. Someone MUST be with you the first 24 hours after you arrive home or your discharge will be delayed. 9. Please wear clothes that are easy to get on and off and wear slip-on shoes.  Thank you for allowing us to care for you!   -- [facility]

## 2018-12-22 NOTE — Telephone Encounter (Signed)
This encounter was created in error - please disregard.

## 2018-12-22 NOTE — Telephone Encounter (Signed)
Vickey Huger F at 12/22/2018 3:22 PM  Status: Signed    New Message            Patient called to confirm that he will be at his appointment tomorrow.

## 2018-12-22 NOTE — Progress Notes (Signed)
Cardiology Office Note:    Date:  12/23/2018   ID:  Ryan [name], DOB [birth year], MRN [id number]  [place]P:  [place], [facility]  Cardiologist:  No primary care provider on file.  Electrophysiologist:  None   Referring MD: Trisha MangleMorrison, Hayden Byrd, *   Chief Complaint: Atrial fibrillation; chest pain.   History of Present Illness:    Ryan [name] is a 53 y.o. male with a hx of overweight, depression, GERD, allergies with rhinitis, snoring with current sleep evaluation and recently documented atrial fibrillation.  He presents today for further evaluation of atrial fibrillation, and while obtaining the history patient endorses severe chest pain.  Patient notes chest pain when he eats or when he walks around.  He notes that when he eats it intensifies the pain to an excruciating level.  He has to get on his hands and knees and feels a squeezing sensation on the left side of his chest.  He has known acid reflux and feels that the sensation is different from his typical GERD.  The pain ramps up in 2 to 3 minutes and when he drops to his hands and knees it will take 30 seconds to remit.  After that he feels his chest is bruised.  It has occurred intermittently over the past several months but has been happening more frequently 2 times in the past 5 weeks.  He notes now that he will become diaphoretic just doing the dishes and will become diaphoretic even with just 4 METS of activity or fast walking. Does not worsen with deep breathing.   First note of atrial fibrillation in January 2020.  Palpitations: will occur not daily but several times a month, feels fleeting, seconds to minutes.  Caffeine: drinks tea all day and one cup coffee Alcohol: rare Water intake: none Snoring: snores - sleep test underway TSH: not checked yet Herbal supplements/diet products: none Syncope/presyncope: no  Constipation and GERD history. Has hiatal hernia.   1-2 ppd 30 years smoker quit 2 years  ago.    The patient denies  dyspnea at rest or with exertion, PND, orthopnea, or leg swelling. Denies syncope or presyncope. Denies dizziness or lightheadedness. Endorses snoring and is currently being evaluated for sleep apnea.    Past Medical History:  Diagnosis Date  . Anxiety   . Arthritis    oa  . Chest pain since 02-11-2013   pt thinks associated with reflux  . Dyspnea    with exertion  . GERD (gastroesophageal reflux disease)   . H/O hiatal hernia     Past Surgical History:  Procedure Laterality Date  . ANKLE SURGERY Right    reconstruction to muscle  . ESOPHAGEAL MANOMETRY N/A 04/17/2015   Procedure: ESOPHAGEAL MANOMETRY (EM);  Surgeon: Charna ElizabethJyothi Mann, MD;  Location: [facility];  Service: Endoscopy;  Laterality: N/A;  . ESOPHAGOGASTRODUODENOSCOPY  11/09/2003  . FOOT SURGERY     right  . HIP SURGERY  age 39   left, done x 2  . KNEE ARTHROSCOPY Left 10/20/2003 and 2012  . MYRINGOTOMY WITH TUBE PLACEMENT Right 08/29/2015   Procedure: MYRINGOTOMY WITH TUBE PLACEMENT;  Surgeon: Newman PiesSu Teoh, MD;  Location: [facility];  Service: ENT;  Laterality: Right;  . PH IMPEDANCE STUDY N/A 04/17/2015   Procedure: PH IMPEDANCE STUDY;  Surgeon: Charna ElizabethJyothi Mann, MD;  Location: [facility];  Service: Endoscopy;  Laterality: N/A;  . SEPTOPLASTY N/A 08/29/2015   Procedure: SEPTOPLASTY;  Surgeon: Newman PiesSu Teoh, MD;  Location: [facility];  Service: ENT;  Laterality: N/A;  . TOTAL HIP ARTHROPLASTY Left 02/23/2013   Procedure: LEFT TOTAL HIP ARTHROPLASTY ANTERIOR APPROACH;  Surgeon: Mauri Pole, MD;  Location: [facility];  Service: Orthopedics;  Laterality: Left;  . TOTAL KNEE ARTHROPLASTY Right 11/11/2018   Procedure: TOTAL KNEE ARTHROPLASTY;  Surgeon: Latanya Maudlin, MD;  Location: [facility];  Service: Orthopedics;  Laterality: Right;  135min  . TURBINATE REDUCTION Bilateral 08/29/2015   Procedure: TURBINATE REDUCTION;  Surgeon: Leta Baptist, MD;  Location: [facility];  Service:  ENT;  Laterality: Bilateral;  . URETHRA SURGERY   76yrs ago   for scar tissue    Current Medications: Current Meds  Medication Sig  . gabapentin (NEURONTIN) 300 MG capsule Take 300 mg by mouth at bedtime.  Marland Kitchen oxyCODONE (OXY IR/ROXICODONE) 5 MG immediate release tablet Take 1-2 tablets (5-10 mg total) by mouth every 6 (six) hours as needed for moderate pain or severe pain.  . pantoprazole (PROTONIX) 40 MG tablet Take 1 tablet (40 mg total) by mouth daily.  . [DISCONTINUED] aspirin EC 325 MG tablet Take 1 tablet (325 mg total) by mouth 2 (two) times a day.  . [DISCONTINUED] aspirin EC 81 MG tablet Take 81 mg by mouth daily.  . [DISCONTINUED] atenolol (TENORMIN) 25 MG tablet Take 25 mg by mouth daily.  . [DISCONTINUED] methocarbamol (ROBAXIN) 500 MG tablet Take 1 tablet (500 mg total) by mouth every 6 (six) hours as needed for muscle spasms.     Allergies:   Patient has no known allergies.   Social History   Socioeconomic History  . Marital status: Married    Spouse name: Not on file  . Number of children: Not on file  . Years of education: Not on file  . Highest education level: Not on file  Occupational History  . Not on file  Social Needs  . Financial resource strain: Not on file  . Food insecurity    Worry: Not on file    Inability: Not on file  . Transportation needs    Medical: Not on file    Non-medical: Not on file  Tobacco Use  . Smoking status: Former Smoker    Packs/day: 1.50    Years: 25.00    Pack years: 37.50    Types: Cigarettes    Quit date: 05/29/2017    Years since quitting: 1.5  . Smokeless tobacco: Never Used  . Tobacco comment: quit 2018  Substance and Sexual Activity  . Alcohol use: Yes    Comment: ocassionally  . Drug use: Yes    Types: Marijuana    Comment: occ marijuana last used 1 week ago  . Sexual activity: Not on file  Lifestyle  . Physical activity    Days per week: Not on file    Minutes per session: Not on file  . Stress: Not on  file  Relationships  . Social Herbalist on phone: Not on file    Gets together: Not on file    Attends religious service: Not on file    Active member of club or organization: Not on file    Attends meetings of clubs or organizations: Not on file    Relationship status: Not on file  Other Topics Concern  . Not on file  Social History Narrative  . Not on file     Family History: The patient's family history is unremarkable for sudden cardiac death. No significant MI history patient  reports.   ROS:   Please see the history of present illness.    All other systems reviewed and are negative.  EKGs/Labs/Other Studies Reviewed:    The following studies were reviewed today:  EKG:  11/05/2018  - afib 12/23/2018 - atrial fibrillation, rate 84 bpm  Recent Labs: 11/05/2018: ALT 33 11/12/2018: BUN 13; Creatinine, Ser 0.86; Hemoglobin 12.2; Platelets 233; Potassium 4.1; Sodium 141  Recent Lipid Panel No results found for: CHOL, TRIG, HDL, CHOLHDL, VLDL, LDLCALC, LDLDIRECT  Physical Exam:    VS:  BP 137/82   Pulse 84   Temp 97.9 F (36.6 C)   Ht 6\' 3"  (1.905 m)   Wt 239 lb (108.4 kg)   SpO2 98%   BMI 29.87 kg/m     Wt Readings from Last 5 Encounters:  12/23/18 239 lb (108.4 kg)  12/03/18 241 lb 9.6 oz (109.6 kg)  11/11/18 239 lb 8 oz (108.6 kg)  11/05/18 239 lb 8 oz (108.6 kg)  07/27/18 248 lb (112.5 kg)     Constitutional: No acute distress Eyes: sclera non-icteric, normal conjunctiva and lids ENMT: normal dentition, moist mucous membranes Cardiovascular: regular rhythm, normal rate, no murmurs. S1 and S2 normal. Radial pulses normal bilaterally. No jugular venous distention.  Respiratory: clear to auscultation bilaterally GI : normal bowel sounds, soft and nontender. No distention.   MSK: extremities warm, well perfused. No edema.  NEURO: grossly nonfocal exam, moves all extremities. PSYCH: alert and oriented x 3, normal mood and affect.    ASSESSMENT:     1. Unstable angina (HCC)   2. Snoring   3. Dyspnea on exertion   4. Atrial fibrillation, unspecified type [facility](HCC)    PLAN:    Coronary angiography for unstable angina recommended. Discussed with patient, shared decision making and he agrees to proceed. In addition to concerns about unstable symptoms, CT coronary angiography may be nondiagnostic due to atrial fibrillation.  INFORMED CONSENT: I have reviewed the risks, indications, and alternatives to cardiac catheterization, possible angioplasty, and stenting with the patient. Risks include but are not limited to bleeding, infection, vascular injury, stroke, myocardial infection, arrhythmia, kidney injury, radiation-related injury in the case of prolonged fluoroscopy use, emergency cardiac surgery, and death. The patient understands the risks of serious complication is 1-2 in 1000 with diagnostic cardiac cath and 1-2% or less with angioplasty/stenting.   Afib - discussed natural history and etiologies - will obtain an echo and he will follow up with sleep clinic since he has had a sleep study. We discussed role of OSA in afib, and discussed lifestyle modification to benefit afib.    TIME SPENT WITH PATIENT: 60 minutes of direct patient care. More than 50% of that time was spent on coordination of care and counseling regarding unstable angina, afib natural history, and consent for coronary angiography.  Ryan BrassGayatri Ambermarie Honeyman, MD [place]  [facility]   Medication Adjustments/Labs and Tests Ordered: Current medicines are reviewed at length with the patient today.  Concerns regarding medicines are outlined above.  Orders Placed This Encounter  Procedures  . CBC with Differential/Platelet  . TSH  . Comprehensive metabolic panel  . EKG 12-Lead  . ECHOCARDIOGRAM COMPLETE   No orders of the defined types were placed in this encounter.   Patient Instructions  Medication Instructions:  TAKE ASPIRIN MORNING OF CATH   If you need a refill  on your cardiac medications before your next appointment, please call your pharmacy.   Lab work: CBC/TSH/CMET TODAY GO FOR YOUR  COVID TEST ON Friday AT 10:40 If you have labs (blood work) drawn today and your tests are completely normal, you will receive your results only by: . MyChart Message (if you have MyChart) OR . A paper copy in the mail If you have any lab test that is abnormal or we need to change your treatment, we will call you to review the results.  Testing/Procedures: Your physician has requested that you have an echocardiogram. Echocardiography is a painless test that uses sound waves to create images of your heart. It provides your doctor with information about the size and shape of your heart and how well your heart's chambers and valves are working. This procedure takes approximately one hour. There are no restrictions for this procedure. [facility] AT [address]  Your physician has requested that you have a cardiac catheterization. Cardiac catheterization is used to diagnose and/or treat various heart conditions. Doctors may recommend this procedure for a number of different reasons. The most common reason is to evaluate chest pain. Chest pain can be a symptom of coronary artery disease (CAD), and cardiac catheterization can show whether plaque is narrowing or blocking your heart's arteries. This procedure is also used to evaluate the valves, as well as measure the blood flow and oxygen levels in different parts of your heart. For further information please visit www.cardiosmart.org. Please follow instruction sheet, as given.   Your physician recommends that you schedule a follow-up appointment in: IN OFFICE WITH DR Latorie Montesano AFTER CATH AND ECHO    If you need a refill on your cardiac medications before your next appointment, please call your pharmacy.       [facility] [address] [place] [address] Dept: [phone] Loc: [phone]  Ryan [name]  12/23/2018  You are scheduled for a Cardiac Catheterization on Tuesday, June 30 with Dr. Thomas Kelly.  1. Please arrive at the [facility] (Main Entrance A) at [facility]: [address] , [place] [address] at 5:30 AM (This time is two hours before your procedure to ensure your preparation). Free valet parking service is available.   Special note: Every effort is made to have your procedure done on time. Please understand that emergencies sometimes delay scheduled procedures.  2. Diet: Do not eat solid foods after midnight.  The patient may have clear liquids until 5am upon the day of the procedure.  3. Labs:TODAY   4. Medication instructions in preparation for your procedure:   Contrast Allergy: No  On the morning of your procedure, take your Aspirin and any morning medicines NOT listed above.  You may use sips of water.  5. Plan for one night stay--bring personal belongings. 6. Bring a current list of your medications and current insurance cards. 7. You MUST have a responsible person to drive you home. 8. Someone MUST be with you the first 24 hours after you arrive home or your discharge will be delayed. 9. Please wear clothes that are easy to get on and off and wear slip-on shoes.  Thank you for allowing us to care for you!   -- [facility]        

## 2018-12-22 NOTE — Telephone Encounter (Signed)
LVM for patient to confirm appt with Dr. Margaretann Loveless on 12-23-18.

## 2018-12-22 NOTE — Telephone Encounter (Signed)
New Message            Patient called to confirm that he will be at his appointment tomorrow.

## 2018-12-23 ENCOUNTER — Other Ambulatory Visit: Payer: Self-pay | Admitting: *Deleted

## 2018-12-23 ENCOUNTER — Encounter: Payer: Self-pay | Admitting: Internal Medicine

## 2018-12-23 ENCOUNTER — Ambulatory Visit (INDEPENDENT_AMBULATORY_CARE_PROVIDER_SITE_OTHER): Payer: BC Managed Care – PPO | Admitting: Internal Medicine

## 2018-12-23 ENCOUNTER — Other Ambulatory Visit: Payer: Self-pay

## 2018-12-23 VITALS — BP 137/82 | HR 84 | Temp 97.9°F | Ht 75.0 in | Wt 239.0 lb

## 2018-12-23 DIAGNOSIS — R0600 Dyspnea, unspecified: Secondary | ICD-10-CM

## 2018-12-23 DIAGNOSIS — R0609 Other forms of dyspnea: Secondary | ICD-10-CM | POA: Diagnosis not present

## 2018-12-23 DIAGNOSIS — I4891 Unspecified atrial fibrillation: Secondary | ICD-10-CM | POA: Diagnosis not present

## 2018-12-23 DIAGNOSIS — R0683 Snoring: Secondary | ICD-10-CM | POA: Diagnosis not present

## 2018-12-23 DIAGNOSIS — Z01812 Encounter for preprocedural laboratory examination: Secondary | ICD-10-CM

## 2018-12-23 DIAGNOSIS — I200 Unstable angina: Secondary | ICD-10-CM

## 2018-12-23 LAB — CBC WITH DIFFERENTIAL/PLATELET
Basophils Absolute: 0.1 10*3/uL (ref 0.0–0.2)
Basos: 1 %
EOS (ABSOLUTE): 0.2 10*3/uL (ref 0.0–0.4)
Eos: 2 %
Hematocrit: 41.4 % (ref 37.5–51.0)
Hemoglobin: 14.2 g/dL (ref 13.0–17.7)
Immature Grans (Abs): 0 10*3/uL (ref 0.0–0.1)
Immature Granulocytes: 1 %
Lymphocytes Absolute: 1.9 10*3/uL (ref 0.7–3.1)
Lymphs: 25 %
MCH: 32.7 pg (ref 26.6–33.0)
MCHC: 34.3 g/dL (ref 31.5–35.7)
MCV: 95 fL (ref 79–97)
Monocytes Absolute: 1 10*3/uL — ABNORMAL HIGH (ref 0.1–0.9)
Monocytes: 13 %
Neutrophils Absolute: 4.4 10*3/uL (ref 1.4–7.0)
Neutrophils: 58 %
Platelets: 321 10*3/uL (ref 150–450)
RBC: 4.34 x10E6/uL (ref 4.14–5.80)
RDW: 12 % (ref 11.6–15.4)
WBC: 7.6 10*3/uL (ref 3.4–10.8)

## 2018-12-23 LAB — COMPREHENSIVE METABOLIC PANEL
ALT: 19 IU/L (ref 0–44)
AST: 13 IU/L (ref 0–40)
Albumin/Globulin Ratio: 2.5 — ABNORMAL HIGH (ref 1.2–2.2)
Albumin: 4.8 g/dL (ref 3.8–4.9)
Alkaline Phosphatase: 97 IU/L (ref 39–117)
BUN/Creatinine Ratio: 15 (ref 9–20)
BUN: 12 mg/dL (ref 6–24)
Bilirubin Total: 0.3 mg/dL (ref 0.0–1.2)
CO2: 22 mmol/L (ref 20–29)
Calcium: 9.7 mg/dL (ref 8.7–10.2)
Chloride: 107 mmol/L — ABNORMAL HIGH (ref 96–106)
Creatinine, Ser: 0.79 mg/dL (ref 0.76–1.27)
GFR calc Af Amer: 120 mL/min/{1.73_m2} (ref >59)
GFR calc non Af Amer: 104 mL/min/{1.73_m2} (ref >59)
Globulin, Total: 1.9 g/dL (ref 1.5–4.5)
Glucose: 100 mg/dL — ABNORMAL HIGH (ref 65–99)
Potassium: 5.2 mmol/L (ref 3.5–5.2)
Sodium: 143 mmol/L (ref 134–144)
Total Protein: 6.7 g/dL (ref 6.0–8.5)

## 2018-12-23 LAB — TSH: TSH: 0.998 u[IU]/mL (ref 0.450–4.500)

## 2018-12-23 NOTE — Patient Instructions (Addendum)
Medication Instructions:  TAKE ASPIRIN MORNING OF CATH   If you need a refill on your cardiac medications before your next appointment, please call your pharmacy.   Lab work: CBC/TSH/CMET TODAY GO FOR YOUR COVID TEST ON Friday AT 10:40 If you have labs (blood work) drawn today and your tests are completely normal, you will receive your results only by: Marland Kitchen MyChart Message (if you have MyChart) OR . A paper copy in the mail If you have any lab test that is abnormal or we need to change your treatment, we will call you to review the results.  Testing/Procedures: Your physician has requested that you have an echocardiogram. Echocardiography is a painless test that uses sound waves to create images of your heart. It provides your doctor with information about the size and shape of your heart and how well your heart's chambers and valves are working. This procedure takes approximately one hour. There are no restrictions for this procedure. [place] [address]  Your physician has requested that you have a cardiac catheterization. Cardiac catheterization is used to diagnose and/or treat various heart conditions. Doctors may recommend this procedure for a number of different reasons. The most common reason is to evaluate chest pain. Chest pain can be a symptom of coronary artery disease (CAD), and cardiac catheterization can show whether plaque is narrowing or blocking your heart's arteries. This procedure is also used to evaluate the valves, as well as measure the blood flow and oxygen levels in different parts of your heart. For further information please visit HugeFiesta.tn. Please follow instruction sheet, as given.   Your physician recommends that you schedule a follow-up appointment in: [place] ECHO    If you need a refill on your cardiac medications before your next appointment, please call your pharmacy.       [place] [address] Dept: [phone] Loc: [place]  12/23/2018  You are scheduled for a Cardiac Catheterization on Tuesday, June 30 with Dr. Shelva Majestic.  1. Please arrive at the [facility] (Main Entrance A) at [facility]: [address] [place], [place] [address] at 5:30 AM (This time is two hours before your procedure to ensure your preparation). Free valet parking service is available.   Special note: Every effort is made to have your procedure done on time. Please understand that emergencies sometimes delay scheduled procedures.  2. Diet: Do not eat solid foods after midnight.  The patient may have clear liquids until 5am upon the day of the procedure.  3. Labs:TODAY   4. Medication instructions in preparation for your procedure:   Contrast Allergy: No  On the morning of your procedure, take your Aspirin and any morning medicines NOT listed above.  You may use sips of water.  5. Plan for one night stay--bring personal belongings. 6. Bring a current list of your medications and current insurance cards. 7. You MUST have a responsible person to drive you home. 8. Someone MUST be with you the first 24 hours after you arrive home or your discharge will be delayed. 9. Please wear clothes that are easy to get on and off and wear slip-on shoes.  Thank you for allowing [place] to care for you!   -- [place] [facility]

## 2018-12-24 ENCOUNTER — Telehealth: Payer: Self-pay | Admitting: Internal Medicine

## 2018-12-24 ENCOUNTER — Ambulatory Visit (INDEPENDENT_AMBULATORY_CARE_PROVIDER_SITE_OTHER): Payer: BC Managed Care – PPO | Admitting: Otolaryngology

## 2018-12-24 NOTE — Telephone Encounter (Signed)
Patient seen yesterday by Dr. Margaretann Loveless and is scheduled for cath.  Orthopaedic put him on antibiotic and he was to let Dr. Margaretann Loveless the name of it.  Doxycycline 100 mg bid

## 2018-12-24 NOTE — Telephone Encounter (Signed)
FYI for you. Thank you!

## 2018-12-25 ENCOUNTER — Other Ambulatory Visit (HOSPITAL_COMMUNITY)
Admission: RE | Admit: 2018-12-25 | Discharge: 2018-12-25 | Disposition: A | Discharge status: Home / Self Care | Payer: BC Managed Care – PPO | Source: Ambulatory Visit | Attending: Cardiovascular Disease | Admitting: Cardiovascular Disease

## 2018-12-25 DIAGNOSIS — Z1159 Encounter for screening for other viral diseases: Secondary | ICD-10-CM | POA: Diagnosis not present

## 2018-12-25 LAB — SARS CORONAVIRUS 2 (TAT 6-24 HRS): SARS Coronavirus 2: NEGATIVE

## 2018-12-28 ENCOUNTER — Encounter: Payer: Self-pay | Admitting: Internal Medicine

## 2018-12-28 ENCOUNTER — Telehealth: Payer: Self-pay | Admitting: *Deleted

## 2018-12-28 NOTE — Telephone Encounter (Signed)
Pt contacted pre-catheterization scheduled at [facility] for: Tuesday December 29, 2018 7:30 AM Verified arrival time and place: [place] Entrance A at: 5:30 AM  Covid-19 test date: 12/25/18  No solid food after midnight prior to cath, clear liquids until 5 AM day of procedure. Contrast allergy: no  AM meds can be  taken pre-cath with sip of water including: ASA 81 mg   Confirmed patient has responsible person to drive home post procedure and observe 24 hours after arriving home: yes  Due to Covid-19 pandemic no visitors are allowed in the hospital (unless cognitive impairment).  Their designated party will be called when their procedure is over for an update and to arrange pick up.  Patients are required to wear a mask when they enter the hospital.       COVID-19 Pre-Screening Questions:  . In the past 7 to 10 days have you had a cough,  shortness of breath, headache, congestion, fever (100 or greater) body aches, chills, sore throat, or sudden loss of taste or sense of smell? no . Have you been around anyone with known Covid 19? no . Have you been around anyone who is awaiting Covid 19 test results in the past 7 to 10 days? no . Have you been around anyone who has been exposed to Covid 19, or has mentioned symptoms of Covid 19 within the past 7 to 10 days? no   I reviewed procedure instructions, mask/visitor/Covid-19 screening questions with patient, he verbalized understanding, thanked me for call.

## 2018-12-29 ENCOUNTER — Ambulatory Visit (HOSPITAL_COMMUNITY)
Admission: RE | Admit: 2018-12-29 | Discharge: 2018-12-29 | Disposition: A | Discharge status: Home / Self Care | Payer: BC Managed Care – PPO | Attending: Cardiovascular Disease | Admitting: Cardiovascular Disease

## 2018-12-29 ENCOUNTER — Encounter (HOSPITAL_COMMUNITY)
Admission: RE | Disposition: A | Discharge status: Home / Self Care | Payer: Self-pay | Source: Home / Self Care | Attending: Cardiovascular Disease

## 2018-12-29 ENCOUNTER — Other Ambulatory Visit: Payer: Self-pay

## 2018-12-29 ENCOUNTER — Encounter (HOSPITAL_COMMUNITY): Payer: Self-pay | Admitting: Cardiovascular Disease

## 2018-12-29 DIAGNOSIS — I200 Unstable angina: Secondary | ICD-10-CM | POA: Diagnosis not present

## 2018-12-29 DIAGNOSIS — R0683 Snoring: Secondary | ICD-10-CM | POA: Diagnosis not present

## 2018-12-29 DIAGNOSIS — I25110 Atherosclerotic heart disease of native coronary artery with unstable angina pectoris: Secondary | ICD-10-CM | POA: Insufficient documentation

## 2018-12-29 DIAGNOSIS — Z87891 Personal history of nicotine dependence: Secondary | ICD-10-CM | POA: Diagnosis not present

## 2018-12-29 DIAGNOSIS — K219 Gastro-esophageal reflux disease without esophagitis: Secondary | ICD-10-CM | POA: Diagnosis not present

## 2018-12-29 DIAGNOSIS — I4819 Other persistent atrial fibrillation: Secondary | ICD-10-CM | POA: Insufficient documentation

## 2018-12-29 DIAGNOSIS — M1990 Unspecified osteoarthritis, unspecified site: Secondary | ICD-10-CM | POA: Insufficient documentation

## 2018-12-29 DIAGNOSIS — Z7982 Long term (current) use of aspirin: Secondary | ICD-10-CM | POA: Diagnosis not present

## 2018-12-29 DIAGNOSIS — Z96651 Presence of right artificial knee joint: Secondary | ICD-10-CM | POA: Diagnosis not present

## 2018-12-29 DIAGNOSIS — Z96642 Presence of left artificial hip joint: Secondary | ICD-10-CM | POA: Insufficient documentation

## 2018-12-29 DIAGNOSIS — R0600 Dyspnea, unspecified: Secondary | ICD-10-CM | POA: Diagnosis not present

## 2018-12-29 DIAGNOSIS — Z79899 Other long term (current) drug therapy: Secondary | ICD-10-CM | POA: Insufficient documentation

## 2018-12-29 HISTORY — PX: LEFT HEART CATH AND CORONARY ANGIOGRAPHY: CATH118249

## 2018-12-29 SURGERY — LEFT HEART CATH AND CORONARY ANGIOGRAPHY
Anesthesia: LOCAL

## 2018-12-29 MED ORDER — HEPARIN SODIUM (PORCINE) 1000 UNIT/ML IJ SOLN
INTRAMUSCULAR | Status: AC
  Filled 2018-12-29: qty 1

## 2018-12-29 MED ORDER — HYDRALAZINE HCL 20 MG/ML IJ SOLN: 10.0000 mg | INTRAMUSCULAR | Status: DC | PRN

## 2018-12-29 MED ORDER — VERAPAMIL HCL 2.5 MG/ML IV SOLN
INTRAVENOUS | Status: AC
  Filled 2018-12-29: qty 2

## 2018-12-29 MED ORDER — HEPARIN (PORCINE) IN NACL 1000-0.9 UT/500ML-% IV SOLN
INTRAVENOUS | Status: AC
  Filled 2018-12-29: qty 1000

## 2018-12-29 MED ORDER — SODIUM CHLORIDE 0.9 % WEIGHT BASED INFUSION
3.0000 mL/kg/h | INTRAVENOUS | Status: AC
  Administered 2018-12-29: 3 mL/kg/h via INTRAVENOUS

## 2018-12-29 MED ORDER — SODIUM CHLORIDE 0.9% FLUSH: 3.0000 mL | Freq: Two times a day (BID) | INTRAVENOUS | Status: DC

## 2018-12-29 MED ORDER — MIDAZOLAM HCL 2 MG/2ML IJ SOLN
INTRAMUSCULAR | Status: DC | PRN
  Administered 2018-12-29: 2 mg via INTRAVENOUS

## 2018-12-29 MED ORDER — LABETALOL HCL 5 MG/ML IV SOLN: 10.0000 mg | INTRAVENOUS | Status: DC | PRN

## 2018-12-29 MED ORDER — LIDOCAINE HCL (PF) 1 % IJ SOLN
INTRAMUSCULAR | Status: DC | PRN
  Administered 2018-12-29: 2 mL

## 2018-12-29 MED ORDER — HEPARIN (PORCINE) IN NACL 1000-0.9 UT/500ML-% IV SOLN
INTRAVENOUS | Status: DC | PRN
  Administered 2018-12-29 (×2): 500 mL

## 2018-12-29 MED ORDER — ASPIRIN 81 MG PO CHEW: 81.0000 mg | CHEWABLE_TABLET | ORAL | Status: DC

## 2018-12-29 MED ORDER — FENTANYL CITRATE (PF) 100 MCG/2ML IJ SOLN
INTRAMUSCULAR | Status: AC
  Filled 2018-12-29: qty 2

## 2018-12-29 MED ORDER — MIDAZOLAM HCL 2 MG/2ML IJ SOLN
INTRAMUSCULAR | Status: AC
  Filled 2018-12-29: qty 2

## 2018-12-29 MED ORDER — HEPARIN SODIUM (PORCINE) 1000 UNIT/ML IJ SOLN
INTRAMUSCULAR | Status: DC | PRN
  Administered 2018-12-29: 5500 [IU] via INTRAVENOUS

## 2018-12-29 MED ORDER — SODIUM CHLORIDE 0.9 % WEIGHT BASED INFUSION: 1.0000 mL/kg/h | INTRAVENOUS | Status: DC

## 2018-12-29 MED ORDER — ACETAMINOPHEN 325 MG PO TABS: 650.0000 mg | ORAL_TABLET | ORAL | Status: DC | PRN

## 2018-12-29 MED ORDER — DIAZEPAM 5 MG PO TABS: 5.0000 mg | ORAL_TABLET | ORAL | Status: DC | PRN

## 2018-12-29 MED ORDER — ONDANSETRON HCL 4 MG/2ML IJ SOLN: 4.0000 mg | Freq: Four times a day (QID) | INTRAMUSCULAR | Status: DC | PRN

## 2018-12-29 MED ORDER — SODIUM CHLORIDE 0.9 % IV SOLN: 250.0000 mL | INTRAVENOUS | Status: DC | PRN

## 2018-12-29 MED ORDER — SODIUM CHLORIDE 0.9 % IV SOLN: INTRAVENOUS | Status: DC

## 2018-12-29 MED ORDER — IOHEXOL 350 MG/ML SOLN
INTRAVENOUS | Status: DC | PRN
  Administered 2018-12-29: 40 mL via INTRA_ARTERIAL

## 2018-12-29 MED ORDER — LIDOCAINE HCL (PF) 1 % IJ SOLN
INTRAMUSCULAR | Status: AC
  Filled 2018-12-29: qty 30

## 2018-12-29 MED ORDER — SODIUM CHLORIDE 0.9% FLUSH: 3.0000 mL | INTRAVENOUS | Status: DC | PRN

## 2018-12-29 MED ORDER — VERAPAMIL HCL 2.5 MG/ML IV SOLN
INTRAVENOUS | Status: DC | PRN
  Administered 2018-12-29: 10 mL via INTRA_ARTERIAL

## 2018-12-29 MED ORDER — FENTANYL CITRATE (PF) 100 MCG/2ML IJ SOLN
INTRAMUSCULAR | Status: DC | PRN
  Administered 2018-12-29: 50 ug via INTRAVENOUS

## 2018-12-29 SURGICAL SUPPLY — 13 items
BAG SNAP BAND KOVER 36X36 (MISCELLANEOUS) ×1 IMPLANT
CATH INFINITI 5FR ANG PIGTAIL (CATHETERS) ×1 IMPLANT
CATH OPTITORQUE TIG 4.0 5F (CATHETERS) ×1 IMPLANT
COVER DOME SNAP 22 D (MISCELLANEOUS) ×1 IMPLANT
DEVICE RAD COMP TR BAND LRG (VASCULAR PRODUCTS) ×1 IMPLANT
GLIDESHEATH SLEND SS 6F .021 (SHEATH) ×1 IMPLANT
GUIDEWIRE INQWIRE 1.5J.035X260 (WIRE) IMPLANT
INQWIRE 1.5J .035X260CM (WIRE) ×2
KIT HEART LEFT (KITS) ×2 IMPLANT
PACK CARDIAC CATHETERIZATION (CUSTOM PROCEDURE TRAY) ×2 IMPLANT
SYR MEDRAD MARK 7 150ML (SYRINGE) ×2 IMPLANT
TRANSDUCER W/STOPCOCK (MISCELLANEOUS) ×2 IMPLANT
TUBING CIL FLEX 10 FLL-RA (TUBING) ×2 IMPLANT

## 2018-12-29 NOTE — Telephone Encounter (Signed)
Scheduled patient PFT prior to his visit with Dr. Annamaria Boots on 04/06/2019. Mailed PFT protocol to patient so he is aware of the do's and don'ts.-pr

## 2018-12-29 NOTE — Interval H&P Note (Signed)
Cath Lab Visit (complete for each Cath Lab visit)  Clinical Evaluation Leading to the Procedure:   ACS: No.  Non-ACS:    Anginal Classification: CCS III  Anti-ischemic medical therapy: No Therapy  Non-Invasive Test Results: No non-invasive testing performed  Prior CABG: No previous CABG      History and Physical Interval Note:  12/29/2018 7:40 AM  Ryan [name]  has presented today for surgery, with the diagnosis of unstable angina.  The various methods of treatment have been discussed with the patient and family. After consideration of risks, benefits and other options for treatment, the patient has consented to  Procedure(s): LEFT HEART CATH AND CORONARY ANGIOGRAPHY (N/A) as a surgical intervention.  The patient's history has been reviewed, patient examined, no change in status, stable for surgery.  I have reviewed the patient's chart and labs.  Questions were answered to the patient's satisfaction.     Shelva Majestic

## 2018-12-29 NOTE — Discharge Instructions (Signed)
Radial Site Care ° °This sheet gives you information about how to care for yourself after your procedure. Your health care provider may also give you more specific instructions. If you have problems or questions, contact your health care provider. °What can I expect after the procedure? °After the procedure, it is common to have: °· Bruising and tenderness at the catheter insertion area. °Follow these instructions at home: °Medicines °· Take over-the-counter and prescription medicines only as told by your health care provider. °Insertion site care °· Follow instructions from your health care provider about how to take care of your insertion site. Make sure you: °? Wash your hands with soap and water before you change your bandage (dressing). If soap and water are not available, use hand sanitizer. °? Change your dressing as told by your health care provider. °? Leave stitches (sutures), skin glue, or adhesive strips in place. These skin closures may need to stay in place for 2 weeks or longer. If adhesive strip edges start to loosen and curl up, you may trim the loose edges. Do not remove adhesive strips completely unless your health care provider tells you to do that. °· Check your insertion site every day for signs of infection. Check for: °? Redness, swelling, or pain. °? Fluid or blood. °? Pus or a bad smell. °? Warmth. °· Do not take baths, swim, or use a hot tub until your health care provider approves. °· You may shower 24-48 hours after the procedure, or as directed by your health care provider. °? Remove the dressing and gently wash the site with plain soap and water. °? Pat the area dry with a clean towel. °? Do not rub the site. That could cause bleeding. °· Do not apply powder or lotion to the site. °Activity ° °· For 24 hours after the procedure, or as directed by your health care provider: °? Do not flex or bend the affected arm. °? Do not push or pull heavy objects with the affected arm. °? Do not  drive yourself home from the hospital or clinic. You may drive 24 hours after the procedure unless your health care provider tells you not to. °? Do not operate machinery or power tools. °· Do not lift anything that is heavier than 10 lb (4.5 kg), or the limit that you are told, until your health care provider says that it is safe. °· Ask your health care provider when it is okay to: °? Return to work or school. °? Resume usual physical activities or sports. °? Resume sexual activity. °General instructions °· If the catheter site starts to bleed, raise your arm and put firm pressure on the site. If the bleeding does not stop, get help right away. This is a medical emergency. °· If you went home on the same day as your procedure, a responsible adult should be with you for the first 24 hours after you arrive home. °· Keep all follow-up visits as told by your health care provider. This is important. °Contact a health care provider if: °· You have a fever. °· You have redness, swelling, or yellow drainage around your insertion site. °Get help right away if: °· You have unusual pain at the radial site. °· The catheter insertion area swells very fast. °· The insertion area is bleeding, and the bleeding does not stop when you hold steady pressure on the area. °· Your arm or hand becomes pale, cool, tingly, or numb. °These symptoms may represent a serious problem   that is an emergency. Do not wait to see if the symptoms will go away. Get medical help right away. Call your local emergency services (911 in the [place] Do not drive yourself to the hospital. °Summary °· After the procedure, it is common to have bruising and tenderness at the site. °· Follow instructions from your health care provider about how to take care of your radial site wound. Check the wound every day for signs of infection. °· Do not lift anything that is heavier than 10 lb (4.5 kg), or the limit that you are told, until your health care provider says  that it is safe. °This information is not intended to replace advice given to you by your health care provider. Make sure you discuss any questions you have with your health care provider. °Document Released: 07/20/2010 Document Revised: 07/23/2017 Document Reviewed: 07/23/2017 °Elsevier Patient Education © 2020 [employer] ° °

## 2018-12-29 NOTE — Progress Notes (Signed)
D/c instructions given to wife Glenard Haring via telephone due to [place] restrictions.  All questions answered and Glenard Haring verbalized understanding

## 2018-12-31 ENCOUNTER — Ambulatory Visit (HOSPITAL_COMMUNITY): Payer: BC Managed Care – PPO | Attending: Cardiovascular Disease

## 2018-12-31 ENCOUNTER — Other Ambulatory Visit: Payer: Self-pay

## 2018-12-31 DIAGNOSIS — R0600 Dyspnea, unspecified: Secondary | ICD-10-CM

## 2018-12-31 DIAGNOSIS — I200 Unstable angina: Secondary | ICD-10-CM

## 2018-12-31 DIAGNOSIS — R0609 Other forms of dyspnea: Secondary | ICD-10-CM | POA: Insufficient documentation

## 2018-12-31 DIAGNOSIS — I4891 Unspecified atrial fibrillation: Secondary | ICD-10-CM | POA: Diagnosis not present

## 2019-01-04 ENCOUNTER — Telehealth: Payer: Self-pay | Admitting: Internal Medicine

## 2019-01-04 DIAGNOSIS — G4733 Obstructive sleep apnea (adult) (pediatric): Secondary | ICD-10-CM

## 2019-01-04 DIAGNOSIS — M25661 Stiffness of right knee, not elsewhere classified: Secondary | ICD-10-CM | POA: Diagnosis not present

## 2019-01-04 DIAGNOSIS — M25561 Pain in right knee: Secondary | ICD-10-CM | POA: Diagnosis not present

## 2019-01-04 NOTE — Telephone Encounter (Signed)
Patient called and would like to have his Home Sleep Study results.  Dr. Annamaria Boots please advise.

## 2019-01-05 NOTE — Telephone Encounter (Signed)
Called and spoke with pt letting him know the results of the HST and stated to pt based on the results, recommendations were to start him on CPAP. Pt verbalized understanding and stated that was fine. Order has been placed for CPAP start. Nothing further needed.

## 2019-01-05 NOTE — Telephone Encounter (Signed)
Home sleep test showed moderate obstructive sleep apnea, averaging 20 apneas/ hor, with drops in blood oxygen level. I recommend CPAP as discussed at our office visit.  Order- new DME, new CPAP auto 5-20, mask of choice, humidifier, supplies, AirView/ card Please confirm that he has an ov scheduled in 31-90 days per insurance regs.

## 2019-01-07 ENCOUNTER — Telehealth: Payer: Self-pay | Admitting: Internal Medicine

## 2019-01-07 DIAGNOSIS — M25661 Stiffness of right knee, not elsewhere classified: Secondary | ICD-10-CM | POA: Diagnosis not present

## 2019-01-07 DIAGNOSIS — M25561 Pain in right knee: Secondary | ICD-10-CM | POA: Diagnosis not present

## 2019-01-07 NOTE — Telephone Encounter (Signed)

## 2019-01-08 ENCOUNTER — Ambulatory Visit: Payer: BC Managed Care – PPO | Admitting: Internal Medicine

## 2019-01-08 ENCOUNTER — Other Ambulatory Visit: Payer: Self-pay

## 2019-01-08 VITALS — BP 136/91 | HR 99 | Temp 97.5°F | Ht 75.0 in | Wt 233.0 lb

## 2019-01-08 DIAGNOSIS — R0683 Snoring: Secondary | ICD-10-CM

## 2019-01-08 DIAGNOSIS — I4891 Unspecified atrial fibrillation: Secondary | ICD-10-CM | POA: Diagnosis not present

## 2019-01-08 DIAGNOSIS — R0609 Other forms of dyspnea: Secondary | ICD-10-CM | POA: Diagnosis not present

## 2019-01-08 DIAGNOSIS — Z01818 Encounter for other preprocedural examination: Secondary | ICD-10-CM | POA: Diagnosis not present

## 2019-01-08 DIAGNOSIS — R0600 Dyspnea, unspecified: Secondary | ICD-10-CM

## 2019-01-08 MED ORDER — APIXABAN 5 MG PO TABS: 5.0000 mg | ORAL_TABLET | Freq: Two times a day (BID) | ORAL | 4 refills | Status: DC

## 2019-01-08 NOTE — Progress Notes (Signed)
Cardiology Office Note:    Date:  01/08/2019   ID:  Ryan [name], DOB [birth year], MRN [id number]  PCP:  [place], [facility]  Cardiologist:  No primary care provider on file.  Electrophysiologist:  None   Referring MD: [place]oe [place], [place]*   Chief Complaint: f/u cath, afib  History of Present Illness:    Ryan Latherroy S Bishop is a 53 y.o. male with a hx of depression, overweight, GERD, allergies with rhinitis, snoring with current sleep evaluation and recently documented atrial fibrillation. He originally presented for atrial fibrillation however endorsed unstable angina. Cath performed and essentially normal coronary arteries. He presents today for follow up and further discussion of afib. He has not had significant episodes of afib.  Fleeting sx of palpitations. Drinks significant amounts of tea daily and rarely drinks water. Sleep evaluation underway.   Past Medical History:  Diagnosis Date  . Anxiety   . Arthritis    oa  . Chest pain since 02-11-2013   pt thinks associated with reflux  . Dyspnea    with exertion  . GERD (gastroesophageal reflux disease)   . H/O hiatal hernia     Past Surgical History:  Procedure Laterality Date  . ANKLE SURGERY Right    reconstruction to muscle  . ESOPHAGEAL MANOMETRY N/A 04/17/2015   Procedure: ESOPHAGEAL MANOMETRY (EM);  Surgeon: Charna ElizabethJyothi Mann, MD;  Location: [facility];  Service: Endoscopy;  Laterality: N/A;  . ESOPHAGOGASTRODUODENOSCOPY  11/09/2003  . FOOT SURGERY     right  . HIP SURGERY  age 67   left, done x 2  . KNEE ARTHROSCOPY Left 10/20/2003 and 2012  . LEFT HEART CATH AND CORONARY ANGIOGRAPHY N/A 12/29/2018   Procedure: LEFT HEART CATH AND CORONARY ANGIOGRAPHY;  Surgeon: Lennette BihariKelly, Thomas A, MD;  [facility]: [facility];  Service: Cardiovascular;  Laterality: N/A;  . MYRINGOTOMY WITH TUBE PLACEMENT Right 08/29/2015   Procedure: MYRINGOTOMY WITH TUBE PLACEMENT;  Surgeon: Newman PiesSu Teoh, MD;  Location: [facility];  Service: ENT;  Laterality: Right;  . PH IMPEDANCE STUDY N/A 04/17/2015   Procedure: PH IMPEDANCE STUDY;  Surgeon: Charna ElizabethJyothi Mann, MD;  Location: [facility];  Service: Endoscopy;  Laterality: N/A;  . SEPTOPLASTY N/A 08/29/2015   Procedure: SEPTOPLASTY;  Surgeon: Newman PiesSu Teoh, MD;  Location: [facility];  Service: ENT;  Laterality: N/A;  . TOTAL HIP ARTHROPLASTY Left 02/23/2013   Procedure: LEFT TOTAL HIP ARTHROPLASTY ANTERIOR APPROACH;  Surgeon: Shelda PalMatthew D Olin, MD;  Location: [facility];  Service: Orthopedics;  Laterality: Left;  . TOTAL KNEE ARTHROPLASTY Right 11/11/2018   Procedure: TOTAL KNEE ARTHROPLASTY;  Surgeon: Ranee GosselinGioffre, Ronald, MD;  Location: [facility];  Service: Orthopedics;  Laterality: Right;  120min  . TURBINATE REDUCTION Bilateral 08/29/2015   Procedure: TURBINATE REDUCTION;  Surgeon: Newman PiesSu Teoh, MD;  Location:  SURGERY CENTER;  Service: ENT;  Laterality: Bilateral;  . URETHRA SURGERY   555yrs ago   for scar tissue    Current Medications: No outpatient medications have been marked as taking for the 01/08/19 encounter (Office Visit) with Parke PoissonAcharya, Dawnisha Marquina A, MD.     Allergies:   Eliquis [apixaban]   Social History   Socioeconomic History  . Marital status: Married    Spouse name: Not on file  . Number of children: Not on file  . Years of education: Not on file  . Highest education level: Not on file  Occupational History  . Not on file  Social Needs  . Physicist, medicalinancial resource  strain: Not on file  . Food insecurity    Worry: Not on file    Inability: Not on file  . Transportation needs    Medical: Not on file    Non-medical: Not on file  Tobacco Use  . Smoking status: Former Smoker    Packs/day: 1.50    Years: 25.00    Pack years: 37.50    Types: Cigarettes    Quit date: 05/29/2017    Years since quitting: 1.6  . Smokeless tobacco: Never Used  . Tobacco comment: quit 2018  Substance and Sexual Activity  . Alcohol use: Yes    Comment:  ocassionally  . Drug use: Yes    Types: Marijuana    Comment: occ marijuana last used 1 week ago  . Sexual activity: Not on file  Lifestyle  . Physical activity    Days per week: Not on file    Minutes per session: Not on file  . Stress: Not on file  Relationships  . Social Musicianconnections    Talks on phone: Not on file    Gets together: Not on file    Attends religious service: Not on file    Active member of club or organization: Not on file    Attends meetings of clubs or organizations: Not on file    Relationship status: Not on file  Other Topics Concern  . Not on file  Social History Narrative  . Not on file     Family History: The patient's fhx is negative for SCD.  ROS:   Please see the history of present illness.    All other systems reviewed and are negative.  EKGs/Labs/Other Studies Reviewed:    The following studies were reviewed today:  EKG:  Not performed.  Recent Labs: 12/23/2018: ALT 19; BUN 12; Creatinine, Ser 0.79; Hemoglobin 14.2; Platelets 321; Potassium 5.2; Sodium 143; TSH 0.998  Recent Lipid Panel No results found for: CHOL, TRIG, HDL, CHOLHDL, VLDL, LDLCALC, LDLDIRECT  Physical Exam:    VS:  BP (!) 136/91   Pulse 99   Temp (!) 97.5 F (36.4 C)   Ht 6\' 3"  (1.905 m)   Wt 233 lb (105.7 kg)   SpO2 99%   BMI 29.12 kg/m     Wt Readings from Last 5 Encounters:  01/26/19 238 lb 14.4 oz (108.4 kg)  01/08/19 233 lb (105.7 kg)  12/29/18 240 lb (108.9 kg)  12/23/18 239 lb (108.4 kg)  12/03/18 241 lb 9.6 oz (109.6 kg)     Constitutional: No acute distress Eyes: sclera non-icteric, normal conjunctiva and lids ENMT: normal dentition, moist mucous membranes Cardiovascular: irregular rhythm, normal rate, no murmurs. S1 and S2 normal. Radial pulses normal bilaterally. No jugular venous distention.  Respiratory: clear to auscultation bilaterally GI : normal bowel sounds, soft and nontender. No distention.   MSK: extremities warm, well perfused. No  edema.  NEURO: grossly nonfocal exam, moves all extremities. PSYCH: alert and oriented x 3, normal mood and affect.  ASSESSMENT:    1. Atrial fibrillation, unspecified type (HCC)   2. Pre-op testing   3. Dyspnea on exertion   4. Snoring    PLAN:    Afib - I believe the patient warrants a trial of restoration of sinus rhythm, which may assist with some of his symptoms. We will plan for cardioversion in 30 days with preceding uninterrupted DOAC therapy. Will initiate Eliquis 5 mg BID. Pt will need an H+P prior to cardioversion. Rate is controlled by lenient  parameters.   INFORMED CONSENT: Risks, benefits and alternatives of direct current cardioversion reviewed including potential for post-cardioversion rhythms, especially life-threatening arrhythmias (ventricular tachycardia and fibrillation, profound bradycardia). Major complications may include serious or fatal arrhythmias, myocardial damage, and acute pulmonary edema; minor complications include skin burns and transient hypotension. Benefits include restoration of sinus rhythm. Alternatives to treatment were discussed, questions were answered. Patient is willing to proceed.   Snoring - per sleep medicine evaluation.   TIME SPENT WITH PATIENT: 25 minutes of direct patient care. More than 50% of that time was spent on coordination of care and counseling regarding afib natural history and details of DCCV.  Weston BrassGayatri Nesa Distel, MD [place]  [facility]   Medication Adjustments/Labs and Tests Ordered: Current medicines are reviewed at length with the patient today.  Concerns regarding medicines are outlined above.  Orders Placed This Encounter  Procedures  . CBC  . Basic metabolic panel   Meds ordered this encounter  Medications  . DISCONTD: apixaban (ELIQUIS) 5 MG TABS tablet    Sig: Take 1 tablet (5 mg total) by mouth 2 (two) times daily.    Dispense:  60 tablet    Refill:  4    Patient Instructions  Medication  Instructions:  START TAKING  eliquis 5 mg one tablet twice a day   If you need a refill on your cardiac medications before your next appointment, please call your pharmacy.   Lab work:  BMP. CBC PRIOR TO CARDIOVERSION   Testing/Procedures:  WILL SCHEDULE AT [facility] -  Your physician has recommended that you have a Cardioversion (DCCV). Electrical Cardioversion uses a jolt of electricity to your heart either through paddles or wired patches attached to your chest. This is a controlled, usually prescheduled, procedure. Defibrillation is done under light anesthesia in the hospital, and you usually go home the day of the procedure. This is done to get your heart back into a normal rhythm. You are not awake for the procedure. Please see the instruction sheet given to you today.    Follow-Up: At [facility], you and your health needs are our priority.  As part of our continuing mission to provide you with exceptional heart care, we have created designated Provider Care Teams.  These Care Teams include your primary Cardiologist (physician) and Advanced Practice Providers (APPs -  Physician Assistants and Nurse Practitioners) who all work together to provide you with the care you need, when you need it. . You will need a follow up appointment in    Sept 2020.  Please call our office 2 months in advance to schedule this appointment.  You may see  one of the following Advanced Practice Providers  . Joni ReiningKathryn Lawrence, DNP, ANP  Any Other Special Instructions Will Be Listed Below (If Applicable).  KEEP HYDRATED    Dear Mr Heloise OchoaMcCarty You are scheduled for a  Cardioversion on Aug 12,2020 with Dr. Jens Somrenshaw.  Please arrive [facility] the [facility]: [address] [place], [place] [address] at 9 am (1 hour prior to procedure unless lab work is needed; if lab work is needed arrive 1.5 hours ahead) at 7am  DIET: Nothing to eat or drink after midnight except a sip of  water with medications (see medication instructions below)  Medication Instructions:  Continue your anticoagulant: ELIQUIS 5 MG ONE TABLET TWICE A DAY  You will need to continue your anticoagulant after your procedure until you  are told by your  Provider that it is safe to stop   Labs: I For  Cardioversion patients: BMET, CBC within 1 week  Come to:  [address] Labs bmp,cbc   Please have done Aug 7,2020- friday    You must have a responsible person to drive you home and stay in the waiting area during your procedure. Failure to do so could result in cancellation.  Bring your insurance cards.  *Special Note: Every effort is made to have your procedure done on time. Occasionally there are emergencies that occur at the hospital that may cause delays. Please be patient if a delay does occur.

## 2019-01-08 NOTE — Patient Instructions (Addendum)
Medication Instructions:  START TAKING  eliquis 5 mg one tablet twice a day   If you need a refill on your cardiac medications before your next appointment, please call your pharmacy.   Lab work:  BMP. CBC PRIOR TO CARDIOVERSION   Testing/Procedures:  WILL SCHEDULE AT [place] physician has recommended that you have a Cardioversion (DCCV). Electrical Cardioversion uses a jolt of electricity to your heart either through paddles or wired patches attached to your chest. This is a controlled, usually prescheduled, procedure. Defibrillation is done under light anesthesia in the hospital, and you usually go home the day of the procedure. This is done to get your heart back into a normal rhythm. You are not awake for the procedure. Please see the instruction sheet given to you today.    Follow-Up: At [facility], you and your health needs are our priority.  As part of our continuing mission to provide you with exceptional heart care, we have created designated Provider Care Teams.  These Care Teams include your primary Cardiologist (physician) and Advanced Practice Providers (APPs -  Physician Assistants and Nurse Practitioners) who all work together to provide you with the care you need, when you need it. . You will need a follow up appointment in    Sept 2020.  Please call our office 2 months in advance to schedule this appointment.  You may see  one of the following Advanced Practice Providers  . Ryan Sims, DNP, ANP  Any Other Special Instructions Will Be Listed Below (If Applicable).  KEEP HYDRATED    Dear Mr Ryan [name] are scheduled for a  Cardioversion on Aug 12,2020 with Dr. Stanford Breed.  Please arrive at the [facility] (Main Entrance A) at [facility]: [address] [place], [place] [address] at 9 am (1 hour prior to procedure unless lab work is needed; if lab work is needed arrive 1.5 hours ahead) at 7am  DIET: Nothing to eat or drink after midnight  except a sip of water with medications (see medication instructions below)  Medication Instructions:  Continue your anticoagulant: ELIQUIS 5 MG ONE TABLET TWICE A DAY  You will need to continue your anticoagulant after your procedure until you  are told by your  Provider that it is safe to stop   Labs: I For  Cardioversion patients: BMET, CBC within 1 week  Come to:  [address] Labs bmp,cbc   Please have done Aug 7,2020- friday    You must have a responsible person to drive you home and stay in the waiting area during your procedure. Failure to do so could result in cancellation.  Bring your insurance cards.  *Special Note: Every effort is made to have your procedure done on time. Occasionally there are emergencies that occur at the hospital that may cause delays. Please be patient if a delay does occur.

## 2019-01-11 ENCOUNTER — Telehealth: Payer: Self-pay | Admitting: Cardiology

## 2019-01-11 DIAGNOSIS — M25561 Pain in right knee: Secondary | ICD-10-CM | POA: Diagnosis not present

## 2019-01-11 DIAGNOSIS — M25661 Stiffness of right knee, not elsewhere classified: Secondary | ICD-10-CM | POA: Diagnosis not present

## 2019-01-11 NOTE — Telephone Encounter (Signed)
Patient came in office and stated he is scheduled for a Cardioverson on 02/10/19---he needs to reschedule either a week earlier or a week later because his wife is having surgery the week of 02/08/19.  He also needs to know what to do about starting his heart medication.

## 2019-01-12 ENCOUNTER — Telehealth: Payer: Self-pay | Admitting: *Deleted

## 2019-01-12 DIAGNOSIS — G4733 Obstructive sleep apnea (adult) (pediatric): Secondary | ICD-10-CM | POA: Diagnosis not present

## 2019-01-12 NOTE — Telephone Encounter (Signed)
SEE NOTE OFROM 01/12/19 WITH INSTRUCTIONS

## 2019-01-12 NOTE — Telephone Encounter (Signed)
Sent message to patient -  In regards to  Changing cardioversion  Date.

## 2019-01-12 NOTE — Telephone Encounter (Signed)
PATIENT SENT MESSAGE THROUGH MYCHART -  THIS IS THE RESPONSE TO MESSAGE.   SPOKE TO OR SCHEDULING  -  CARDIOVERSION  RESCHEDULE TO 02/17/19 AT 9 AM .   APPOINTMENT SCHEDULED FOR 02/05/19 AT 8 AM WITH  L. KILROY PA  FOR CARDIOVERSION WORK UP AND PATIENT IS TO HAVE LABS DONE THE SAME.  COVID TEST TO BE SCHEDULE as well for AUG 15 ,2020 at 12 noon   [place]   Patient states  He started ELIQUIS  5 MG  7/13 20 . Patient aware not to miss any doses of medications

## 2019-01-14 DIAGNOSIS — M25661 Stiffness of right knee, not elsewhere classified: Secondary | ICD-10-CM | POA: Diagnosis not present

## 2019-01-14 DIAGNOSIS — M25561 Pain in right knee: Secondary | ICD-10-CM | POA: Diagnosis not present

## 2019-01-26 ENCOUNTER — Telehealth: Payer: Self-pay | Admitting: Cardiology

## 2019-01-26 ENCOUNTER — Other Ambulatory Visit: Payer: Self-pay

## 2019-01-26 ENCOUNTER — Telehealth: Payer: Self-pay | Admitting: Internal Medicine

## 2019-01-26 ENCOUNTER — Ambulatory Visit: Payer: BC Managed Care – PPO | Admitting: Cardiology

## 2019-01-26 DIAGNOSIS — G4733 Obstructive sleep apnea (adult) (pediatric): Secondary | ICD-10-CM | POA: Insufficient documentation

## 2019-01-26 DIAGNOSIS — I4819 Other persistent atrial fibrillation: Secondary | ICD-10-CM | POA: Diagnosis not present

## 2019-01-26 DIAGNOSIS — T50905A Adverse effect of unspecified drugs, medicaments and biological substances, initial encounter: Secondary | ICD-10-CM | POA: Insufficient documentation

## 2019-01-26 DIAGNOSIS — G4730 Sleep apnea, unspecified: Secondary | ICD-10-CM

## 2019-01-26 DIAGNOSIS — Z0389 Encounter for observation for other suspected diseases and conditions ruled out: Secondary | ICD-10-CM

## 2019-01-26 DIAGNOSIS — IMO0001 Reserved for inherently not codable concepts without codable children: Secondary | ICD-10-CM

## 2019-01-26 MED ORDER — ASPIRIN EC 81 MG PO TBEC: 81.0000 mg | DELAYED_RELEASE_TABLET | Freq: Every day | ORAL | 3 refills | Status: DC

## 2019-01-26 MED ORDER — ATENOLOL 25 MG PO TABS: 25.0000 mg | ORAL_TABLET | Freq: Every day | ORAL | 6 refills | Status: DC

## 2019-01-26 MED ORDER — DIPHENHYDRAMINE HCL 25 MG PO CAPS: 25.0000 mg | ORAL_CAPSULE | Freq: Four times a day (QID) | ORAL | 0 refills | Status: DC | PRN

## 2019-01-26 NOTE — Telephone Encounter (Signed)
° °  Pt c/o medication issue:  1. Name of Medication: apixaban (ELIQUIS) 5 MG TABS tablet  2. How are you currently taking this medication (dosage and times per day)? 5 mg 2x daily  3. Are you having a reaction (difficulty breathing--STAT)? rash  4. What is your medication issue? Patient has developed a rash, and feels very itchy. He initially thought it was poison ivy, and has been putting calamine lotion on it, but it has not gone away. He notices the rash starting about an hour after he takes his medication

## 2019-01-26 NOTE — H&P (View-Only) (Signed)
Cardiology Office Note:    Date:  01/26/2019   ID:  Ryan Latherroy S Beightol, DOB 09/10/1965, MRN [id number]  PCP:  [place], [facility]  Cardiologist:  No primary care provider on file.  Electrophysiologist:  None   Referring MD: [place]oe [place], [place]*   Rash  History of Present Illness:    Ryan [name] is a 53 y.o. male with a hx of PAF documented by EKG in Jan 2020 on an ED visit but apparently not noticed till he went for knee replacement in May 2020.  He saw Dr Jacques NavyAcharya 12/23/2018 and because of his history of chest pain was set up for OP cath.  This was done 12/29/2018 and showed normal coronaries. Eliquis was added and the plan was for him to have OP DCCV.  He is in the office for pre op physical.   He tells me he developed rash and itching in his arms and legs on Eliquis and stopped taking it this morning.  He thought it was poison ivey and was putting different creams on with no improvement.   Past Medical History:  Diagnosis Date  . Anxiety   . Arthritis    oa  . Chest pain since 02-11-2013   pt thinks associated with reflux  . Dyspnea    with exertion  . GERD (gastroesophageal reflux disease)   . H/O hiatal hernia     Past Surgical History:  Procedure Laterality Date  . ANKLE SURGERY Right    reconstruction to muscle  . ESOPHAGEAL MANOMETRY N/A 04/17/2015   Procedure: ESOPHAGEAL MANOMETRY (EM);  Surgeon: Charna ElizabethJyothi Mann, MD;  Location: [facility];  Service: Endoscopy;  Laterality: N/A;  . ESOPHAGOGASTRODUODENOSCOPY  11/09/2003  . FOOT SURGERY     right  . HIP SURGERY  age 2   left, done x 2  . KNEE ARTHROSCOPY Left 10/20/2003 and 2012  . LEFT HEART CATH AND CORONARY ANGIOGRAPHY N/A 12/29/2018   Procedure: LEFT HEART CATH AND CORONARY ANGIOGRAPHY;  Surgeon: Lennette BihariKelly, Thomas A, MD;  [facility];  Service: Cardiovascular;  Laterality: N/A;  . MYRINGOTOMY WITH TUBE PLACEMENT Right 08/29/2015   Procedure: MYRINGOTOMY WITH TUBE PLACEMENT;   Surgeon: Newman PiesSu Teoh, MD;  Location: [facility];  Service: ENT;  Laterality: Right;  . PH IMPEDANCE STUDY N/A 04/17/2015   Procedure: PH IMPEDANCE STUDY;  Surgeon: Charna ElizabethJyothi Mann, MD;  [facility];  Service: Endoscopy;  Laterality: N/A;  . SEPTOPLASTY N/A 08/29/2015   Procedure: SEPTOPLASTY;  Surgeon: Newman PiesSu Teoh, MD;  Location: [facility];  Service: ENT;  Laterality: N/A;  . TOTAL HIP ARTHROPLASTY Left 02/23/2013   Procedure: LEFT TOTAL HIP ARTHROPLASTY ANTERIOR APPROACH;  Surgeon: Shelda PalMatthew D Olin, MD;  Location: [facility];  Service: Orthopedics;  Laterality: Left;  . TOTAL KNEE ARTHROPLASTY Right 11/11/2018   Procedure: TOTAL KNEE ARTHROPLASTY;  Surgeon: Ranee GosselinGioffre, Ronald, MD;  Location: [facility];  Service: Orthopedics;  Laterality: Right;  120min  . TURBINATE REDUCTION Bilateral 08/29/2015   Procedure: TURBINATE REDUCTION;  Surgeon: Newman PiesSu Teoh, MD;  Location: [facility];  Service: ENT;  Laterality: Bilateral;  . URETHRA SURGERY   7746yrs ago   for scar tissue    Current Medications: Current Meds  Medication Sig  . pantoprazole (PROTONIX) 40 MG tablet Take 1 tablet (40 mg total) by mouth daily.  . [DISCONTINUED] apixaban (ELIQUIS) 5 MG TABS tablet Take 1 tablet (5 mg total) by mouth 2 (two) times daily.     Allergies:  Patient has no known allergies.   Social History   Socioeconomic History  . Marital status: Married    Spouse name: Not on file  . Number of children: Not on file  . Years of education: Not on file  . Highest education level: Not on file  Occupational History  . Not on file  Social Needs  . Financial resource strain: Not on file  . Food insecurity    Worry: Not on file    Inability: Not on file  . Transportation needs    Medical: Not on file    Non-medical: Not on file  Tobacco Use  . Smoking status: Former Smoker    Packs/day: 1.50    Years: 25.00    Pack years: 37.50    Types: Cigarettes    Quit date: 05/29/2017    Years  since quitting: 1.6  . Smokeless tobacco: Never Used  . Tobacco comment: quit 2018  Substance and Sexual Activity  . Alcohol use: Yes    Comment: ocassionally  . Drug use: Yes    Types: Marijuana    Comment: occ marijuana last used 1 week ago  . Sexual activity: Not on file  Lifestyle  . Physical activity    Days per week: Not on file    Minutes per session: Not on file  . Stress: Not on file  Relationships  . Social Herbalist on phone: Not on file    Gets together: Not on file    Attends religious service: Not on file    Active member of club or organization: Not on file    Attends meetings of clubs or organizations: Not on file    Relationship status: Not on file  Other Topics Concern  . Not on file  Social History Narrative  . Not on file     Family History: The patient's family history is not on file.  ROS:   Please see the history of present illness.     All other systems reviewed and are negative.  EKGs/Labs/Other Studies Reviewed:    The following studies were reviewed today:  Recent Labs: 12/23/2018: ALT 19; BUN 12; Creatinine, Ser 0.79; Hemoglobin 14.2; Platelets 321; Potassium 5.2; Sodium 143; TSH 0.998  Recent Lipid Panel No results found for: CHOL, TRIG, HDL, CHOLHDL, VLDL, LDLCALC, LDLDIRECT  Physical Exam:    VS:  Pulse 96   Temp 97.9 F (36.6 C) (Temporal)   Ht 6\' 3"  (1.905 m)   Wt 238 lb 14.4 oz (108.4 kg)   SpO2 97%   BMI 29.86 kg/m     Wt Readings from Last 3 Encounters:  01/26/19 238 lb 14.4 oz (108.4 kg)  01/08/19 233 lb (105.7 kg)  12/29/18 240 lb (108.9 kg)     GEN:  Well nourished, well developed in no acute distress HEENT: Normal NECK: No JVD; No carotid bruits LYMPHATICS: No lymphadenopathy CARDIAC: irregularly irregular, no murmurs, rubs, gallops RESPIRATORY:  Clear to auscultation without rales, wheezing or rhonchi  ABDOMEN: Soft, non-tender, non-distended MUSCULOSKELETAL:  No edema; No deformity  SKIN: Warm  and dry- raised rash in his arms- difficult to see if its red secondary to tattoos. No rash on his back or chest NEUROLOGIC:  Alert and oriented x 3 PSYCHIATRIC:  Normal affect   ASSESSMENT:    Persistent atrial fibrillation HR is a little fast- he was placed on Atenolol 25 mg at one point and I asked him to resume this.   Drug reaction  Rash itching presumably secondary to Eliquis  Sleep apnea Now on C-pap  Normal coronary arteries Cath June 2020  PLAN:    Stop Eliquis (CHADS VASC=0, he was on this in preparation for cardioversion).  Resume atenolol 25 mg.  Benadryl 25 mg Q 6 x 48 hours then prn.  I'll see him back next week.  Will discuss plans for AF with dr Acharya before that visit.    Medication Adjustments/Labs and Tests Ordered: Current medicines are reviewed at length with the patient today.  Concerns regarding medicines are outlined above.  No orders of the defined types were placed in this encounter.  Meds ordered this encounter  Medications  . aspirin EC 81 MG tablet    Sig: Take 1 tablet (81 mg total) by mouth daily.    Dispense:  90 tablet    Refill:  3  . diphenhydrAMINE (BENADRYL ALLERGY) 25 mg capsule    Sig: Take 1 capsule (25 mg total) by mouth every 6 (six) hours as needed.    Dispense:  30 capsule    Refill:  0  . atenolol (TENORMIN) 25 MG tablet    Sig: Take 1 tablet (25 mg total) by mouth daily.    Dispense:  30 tablet    Refill:  6    Patient Instructions  Medication Instructions:  START Benadryl 25mg Take 1 tablet every 6 hours for 24 hours then take 1 tablet every 6 hours as needed for itching  RESUME Atenolol 25mg Take 1 tablet every day  START Aspirin 81mg Take 1 tablet once a day  STOP Eliquis  If you need a refill on your cardiac medications before your next appointment, please call your pharmacy.   Lab work: None  If you have labs (blood work) drawn today and your tests are completely normal, you will receive your results only  by: . MyChart Message (if you have MyChart) OR . A paper copy in the mail If you have any lab test that is abnormal or we need to change your treatment, we will call you to review the results.  Testing/Procedures: None   Follow-Up: At [facility], you and your health needs are our priority.  As part of our continuing mission to provide you with exceptional heart care, we have created designated Provider Care Teams.  These Care Teams include your primary Cardiologist (physician) and Advanced Practice Providers (APPs -  Physician Assistants and Nurse Practitioners) who all work together to provide you with the care you need, when you need it.  . Your physician recommends that you schedule a follow-up appointment in: 1 week with Caleesi Kohl or Dr Acharya  Any Other Special Instructions Will Be Listed Below (If Applicable).      Signed, Charlene Cowdrey, PA-C  01/26/2019 3:37 PM    [facility] 

## 2019-01-26 NOTE — Assessment & Plan Note (Signed)
Rash itching presumably secondary to Eliquis

## 2019-01-26 NOTE — Assessment & Plan Note (Signed)
HR is a little fast- he was placed on Atenolol 25 mg at one point and I asked him to resume this.

## 2019-01-26 NOTE — Progress Notes (Signed)
Cardiology Office Note:    Date:  01/26/2019   ID:  Ryan [name], DOB 09/10/1965, MRN [id number]  PCP:  [place], [facility]  Cardiologist:  No primary care provider on file.  Electrophysiologist:  None   Referring MD: Roe [place], [place]*   Rash  History of Present Illness:    Ryan Latherroy S Stanke is a 53 y.o. male with a hx of PAF documented by EKG in Jan 2020 on an ED visit but apparently not noticed till he went for knee replacement in May 2020.  He saw Dr Jacques NavyAcharya 12/23/2018 and because of his history of chest pain was set up for OP cath.  This was done 12/29/2018 and showed normal coronaries. Eliquis was added and the plan was for him to have OP DCCV.  He is in the office for pre op physical.   He tells me he developed rash and itching in his arms and legs on Eliquis and stopped taking it this morning.  He thought it was poison ivey and was putting different creams on with no improvement.   Past Medical History:  Diagnosis Date  . Anxiety   . Arthritis    oa  . Chest pain since 02-11-2013   pt thinks associated with reflux  . Dyspnea    with exertion  . GERD (gastroesophageal reflux disease)   . H/O hiatal hernia     Past Surgical History:  Procedure Laterality Date  . ANKLE SURGERY Right    reconstruction to muscle  . ESOPHAGEAL MANOMETRY N/A 04/17/2015   Procedure: ESOPHAGEAL MANOMETRY (EM);  Surgeon: Charna ElizabethJyothi Mann, MD;  [facility];  Service: Endoscopy;  Laterality: N/A;  . ESOPHAGOGASTRODUODENOSCOPY  11/09/2003  . FOOT SURGERY     right  . HIP SURGERY  age 2   left, done x 2  . KNEE ARTHROSCOPY Left 10/20/2003 and 2012  . LEFT HEART CATH AND CORONARY ANGIOGRAPHY N/A 12/29/2018   Procedure: LEFT HEART CATH AND CORONARY ANGIOGRAPHY;  Surgeon: Lennette BihariKelly, Thomas A, MD;  [facility];  Service: Cardiovascular;  Laterality: N/A;  . MYRINGOTOMY WITH TUBE PLACEMENT Right 08/29/2015   Procedure: MYRINGOTOMY WITH TUBE PLACEMENT;   Surgeon: Newman PiesSu Teoh, MD;  Location: [facility];  Service: ENT;  Laterality: Right;  . PH IMPEDANCE STUDY N/A 04/17/2015   Procedure: PH IMPEDANCE STUDY;  Surgeon: Charna ElizabethJyothi Mann, MD;  [facility];  Service: Endoscopy;  Laterality: N/A;  . SEPTOPLASTY N/A 08/29/2015   Procedure: SEPTOPLASTY;  Surgeon: Newman PiesSu Teoh, MD;  Location: [facility];  Service: ENT;  Laterality: N/A;  . TOTAL HIP ARTHROPLASTY Left 02/23/2013   Procedure: LEFT TOTAL HIP ARTHROPLASTY ANTERIOR APPROACH;  Surgeon: Shelda PalMatthew D Olin, MD;  Location: [facility];  Service: Orthopedics;  Laterality: Left;  . TOTAL KNEE ARTHROPLASTY Right 11/11/2018   Procedure: TOTAL KNEE ARTHROPLASTY;  Surgeon: Ranee GosselinGioffre, Ronald, MD;  Location: [facility];  Service: Orthopedics;  Laterality: Right;  120min  . TURBINATE REDUCTION Bilateral 08/29/2015   Procedure: TURBINATE REDUCTION;  Surgeon: Newman PiesSu Teoh, MD;  Location: [facility];  Service: ENT;  Laterality: Bilateral;  . URETHRA SURGERY   7746yrs ago   for scar tissue    Current Medications: Current Meds  Medication Sig  . pantoprazole (PROTONIX) 40 MG tablet Take 1 tablet (40 mg total) by mouth daily.  . [DISCONTINUED] apixaban (ELIQUIS) 5 MG TABS tablet Take 1 tablet (5 mg total) by mouth 2 (two) times daily.     Allergies:  Patient has no known allergies.   Social History   Socioeconomic History  . Marital status: Married    Spouse name: Not on file  . Number of children: Not on file  . Years of education: Not on file  . Highest education level: Not on file  Occupational History  . Not on file  Social Needs  . Financial resource strain: Not on file  . Food insecurity    Worry: Not on file    Inability: Not on file  . Transportation needs    Medical: Not on file    Non-medical: Not on file  Tobacco Use  . Smoking status: Former Smoker    Packs/day: 1.50    Years: 25.00    Pack years: 37.50    Types: Cigarettes    Quit date: 05/29/2017    Years  since quitting: 1.6  . Smokeless tobacco: Never Used  . Tobacco comment: quit 2018  Substance and Sexual Activity  . Alcohol use: Yes    Comment: ocassionally  . Drug use: Yes    Types: Marijuana    Comment: occ marijuana last used 1 week ago  . Sexual activity: Not on file  Lifestyle  . Physical activity    Days per week: Not on file    Minutes per session: Not on file  . Stress: Not on file  Relationships  . Social Herbalist on phone: Not on file    Gets together: Not on file    Attends religious service: Not on file    Active member of club or organization: Not on file    Attends meetings of clubs or organizations: Not on file    Relationship status: Not on file  Other Topics Concern  . Not on file  Social History Narrative  . Not on file     Family History: The patient's family history is not on file.  ROS:   Please see the history of present illness.     All other systems reviewed and are negative.  EKGs/Labs/Other Studies Reviewed:    The following studies were reviewed today:  Recent Labs: 12/23/2018: ALT 19; BUN 12; Creatinine, Ser 0.79; Hemoglobin 14.2; Platelets 321; Potassium 5.2; Sodium 143; TSH 0.998  Recent Lipid Panel No results found for: CHOL, TRIG, HDL, CHOLHDL, VLDL, LDLCALC, LDLDIRECT  Physical Exam:    VS:  Pulse 96   Temp 97.9 F (36.6 C) (Temporal)   Ht 6\' 3"  (1.905 m)   Wt 238 lb 14.4 oz (108.4 kg)   SpO2 97%   BMI 29.86 kg/m     Wt Readings from Last 3 Encounters:  01/26/19 238 lb 14.4 oz (108.4 kg)  01/08/19 233 lb (105.7 kg)  12/29/18 240 lb (108.9 kg)     GEN:  Well nourished, well developed in no acute distress HEENT: Normal NECK: No JVD; No carotid bruits LYMPHATICS: No lymphadenopathy CARDIAC: irregularly irregular, no murmurs, rubs, gallops RESPIRATORY:  Clear to auscultation without rales, wheezing or rhonchi  ABDOMEN: Soft, non-tender, non-distended MUSCULOSKELETAL:  No edema; No deformity  SKIN: Warm  and dry- raised rash in his arms- difficult to see if its red secondary to tattoos. No rash on his back or chest NEUROLOGIC:  Alert and oriented x 3 PSYCHIATRIC:  Normal affect   ASSESSMENT:    Persistent atrial fibrillation HR is a little fast- he was placed on Atenolol 25 mg at one point and I asked him to resume this.   Drug reaction  Rash itching presumably secondary to Eliquis  Sleep apnea Now on C-pap  Normal coronary arteries Cath June 2020  PLAN:    Stop Eliquis (CHADS VASC=0, he was on this in preparation for cardioversion).  Resume atenolol 25 mg.  Benadryl 25 mg Q 6 x 48 hours then prn.  I'll see him back next week.  Will discuss plans for AF with dr Jacques NavyAcharya before that visit.    Medication Adjustments/Labs and Tests Ordered: Current medicines are reviewed at length with the patient today.  Concerns regarding medicines are outlined above.  No orders of the defined types were placed in this encounter.  Meds ordered this encounter  Medications  . aspirin EC 81 MG tablet    Sig: Take 1 tablet (81 mg total) by mouth daily.    Dispense:  90 tablet    Refill:  3  . diphenhydrAMINE (BENADRYL ALLERGY) 25 mg capsule    Sig: Take 1 capsule (25 mg total) by mouth every 6 (six) hours as needed.    Dispense:  30 capsule    Refill:  0  . atenolol (TENORMIN) 25 MG tablet    Sig: Take 1 tablet (25 mg total) by mouth daily.    Dispense:  30 tablet    Refill:  6    Patient Instructions  Medication Instructions:  START Benadryl 25mg  Take 1 tablet every 6 hours for 24 hours then take 1 tablet every 6 hours as needed for itching  RESUME Atenolol 25mg  Take 1 tablet every day  START Aspirin 81mg  Take 1 tablet once a day  STOP Eliquis  If you need a refill on your cardiac medications before your next appointment, please call your pharmacy.   Lab work: None  If you have labs (blood work) drawn today and your tests are completely normal, you will receive your results only  by: Marland Kitchen. MyChart Message (if you have MyChart) OR . A paper copy in the mail If you have any lab test that is abnormal or we need to change your treatment, we will call you to review the results.  Testing/Procedures: None   Follow-Up: At [facility], you and your health needs are our priority.  As part of our continuing mission to provide you with exceptional heart care, we have created designated Provider Care Teams.  These Care Teams include your primary Cardiologist (physician) and Advanced Practice Providers (APPs -  Physician Assistants and Nurse Practitioners) who all work together to provide you with the care you need, when you need it.  . Your physician recommends that you schedule a follow-up appointment in: 1 week with Franky MachoLuke or Dr Jacques NavyAcharya  Any Other Special Instructions Will Be Listed Below (If Applicable).      Signed, Corine ShelterLuke Arliene Rosenow, PA-C  01/26/2019 3:37 PM    [facility]

## 2019-01-26 NOTE — Assessment & Plan Note (Signed)
Cath June 2020

## 2019-01-26 NOTE — Telephone Encounter (Signed)

## 2019-01-26 NOTE — Telephone Encounter (Signed)
Phoned patient who reports starting new Eliquis on 01/12/19 and developed rash to arms, legs and back shortly thereafter. Pt thought he had gotten this from poison ivy and has been applying creams with no improvement in symptoms. Denies difficulty breathing. Scheduled office visit for this afternoon with Kerin Ransom, PA.

## 2019-01-26 NOTE — Assessment & Plan Note (Signed)
Now on C-pap

## 2019-01-26 NOTE — Patient Instructions (Addendum)
Medication Instructions:  START Benadryl 25mg  Take 1 tablet every 6 hours for 24 hours then take 1 tablet every 6 hours as needed for itching  RESUME Atenolol 25mg  Take 1 tablet every day  START Aspirin 81mg  Take 1 tablet once a day  STOP Eliquis  If you need a refill on your cardiac medications before your next appointment, please call your pharmacy.   Lab work: None  If you have labs (blood work) drawn today and your tests are completely normal, you will receive your results only by: Marland Kitchen MyChart Message (if you have MyChart) OR . A paper copy in the mail If you have any lab test that is abnormal or we need to change your treatment, we will call you to review the results.  Testing/Procedures: None   Follow-Up: At [facility], you and your health needs are our priority.  As part of our continuing mission to provide you with exceptional heart care, we have created designated Provider Care Teams.  These Care Teams include your primary Cardiologist (physician) and Advanced Practice Providers (APPs -  Physician Assistants and Nurse Practitioners) who all work together to provide you with the care you need, when you need it.  . Your physician recommends that you schedule a follow-up appointment in: 1 week with Lurena Joiner or Dr Margaretann Loveless  Any Other Special Instructions Will Be Listed Below (If Applicable).

## 2019-01-27 ENCOUNTER — Telehealth: Payer: Self-pay

## 2019-01-27 ENCOUNTER — Ambulatory Visit: Payer: BC Managed Care – PPO | Admitting: Cardiology

## 2019-01-27 MED ORDER — RIVAROXABAN 20 MG PO TABS: 20.0000 mg | ORAL_TABLET | Freq: Every day | ORAL | 6 refills | Status: DC

## 2019-01-27 NOTE — Telephone Encounter (Signed)
-----   Message from Erlene Quan, [place] sent at 01/27/2019  1:10 PM EDT ----- Please let the patient know I discussed his case with Dr Margaretann Loveless. Plan to start Xarelto 20 mg daily now and plan for OP DCCV in 4 weeks. Please schedule the cardioversion for 4 weeks from tomorrow.  Kerin Ransom PA-C 01/27/2019 1:11 PM

## 2019-01-27 NOTE — Telephone Encounter (Signed)
Spoke with patient and gave Dr Delphina Cahill recommendations. Informed patient I would send over a rx for Xarelto to the pharmacy. Gave patient the "[facility] Trial Offer" to get 30 days free of Xarelto. Informed patient that I would order his cardioversion and call him back with the directions and instructions.  Per Lurena Joiner asked patient how was his rash was and he states he is doing better. He states yesterday was the first day he had not taken Eliquis.   Per Lurena Joiner patient can stop Aspirin when he starts Xarelto and to not take them at the same time.  Reviewed Cardioversion instructions in great detail with patient and scheduled covid test.  Samples of Xarelto placed up front for pickup, patient made aware and he voiced understanding.

## 2019-01-27 NOTE — Telephone Encounter (Signed)
   CARDIOVERSION INSTRUCTIONS  Dear Ryan [name]  You are scheduled for a Cardioversion. Cardioversion on 02/25/2019 with Dr. Candee Furbish.  Please arrive at the [facility] (Main Entrance A) at [facility]: [address] [place], [place] [address] at 11:30 am. (1 hour prior to procedure unless lab work is needed; if lab work is needed arrive 1.5 hours ahead)  DIET: Nothing to eat or drink after midnight except a sip of water with medications (see medication instructions below)  Medication Instructions: Continue your anticoagulant: XARELTO You will need to continue your anticoagulant after your procedure until you are told by your Provider that it is safe to stop  Labs:  Come to the lab at [address] between the hours of 8:00 am and 4:30 pm. You do not have to be fasting. The lab closes for lunch from 12:45-1:45pm.  Please complete lab work before having covid test.  You must have a responsible person to drive you home and stay in the waiting area during your procedure. Failure to do so could result in cancellation.  Covid Testing:  You will need to complete your covid test 4 days prior to your procedure.   Your appointment for your covid test will be on Monday 02/22/2019 at   After you have your Covid test you must quarantine until your procedure.  Bring your insurance cards.   *Special Note: Every effort is made to have your procedure done on time. Occasionally there are emergencies that occur at the hospital that may cause delays. Please be patient if a delay does occur.

## 2019-02-05 ENCOUNTER — Ambulatory Visit: Payer: BC Managed Care – PPO | Admitting: Cardiology

## 2019-02-12 DIAGNOSIS — G4733 Obstructive sleep apnea (adult) (pediatric): Secondary | ICD-10-CM | POA: Diagnosis not present

## 2019-02-13 ENCOUNTER — Other Ambulatory Visit (HOSPITAL_COMMUNITY): Payer: BC Managed Care – PPO

## 2019-02-14 IMAGING — DX DG CHEST 2V
2 series · 2 of 2 positions shown · non-contrast
Comparison: None.

CLINICAL DATA: Chest pain and shortness of breath

EXAM:
CHEST - 2 VIEW

[chest pa]
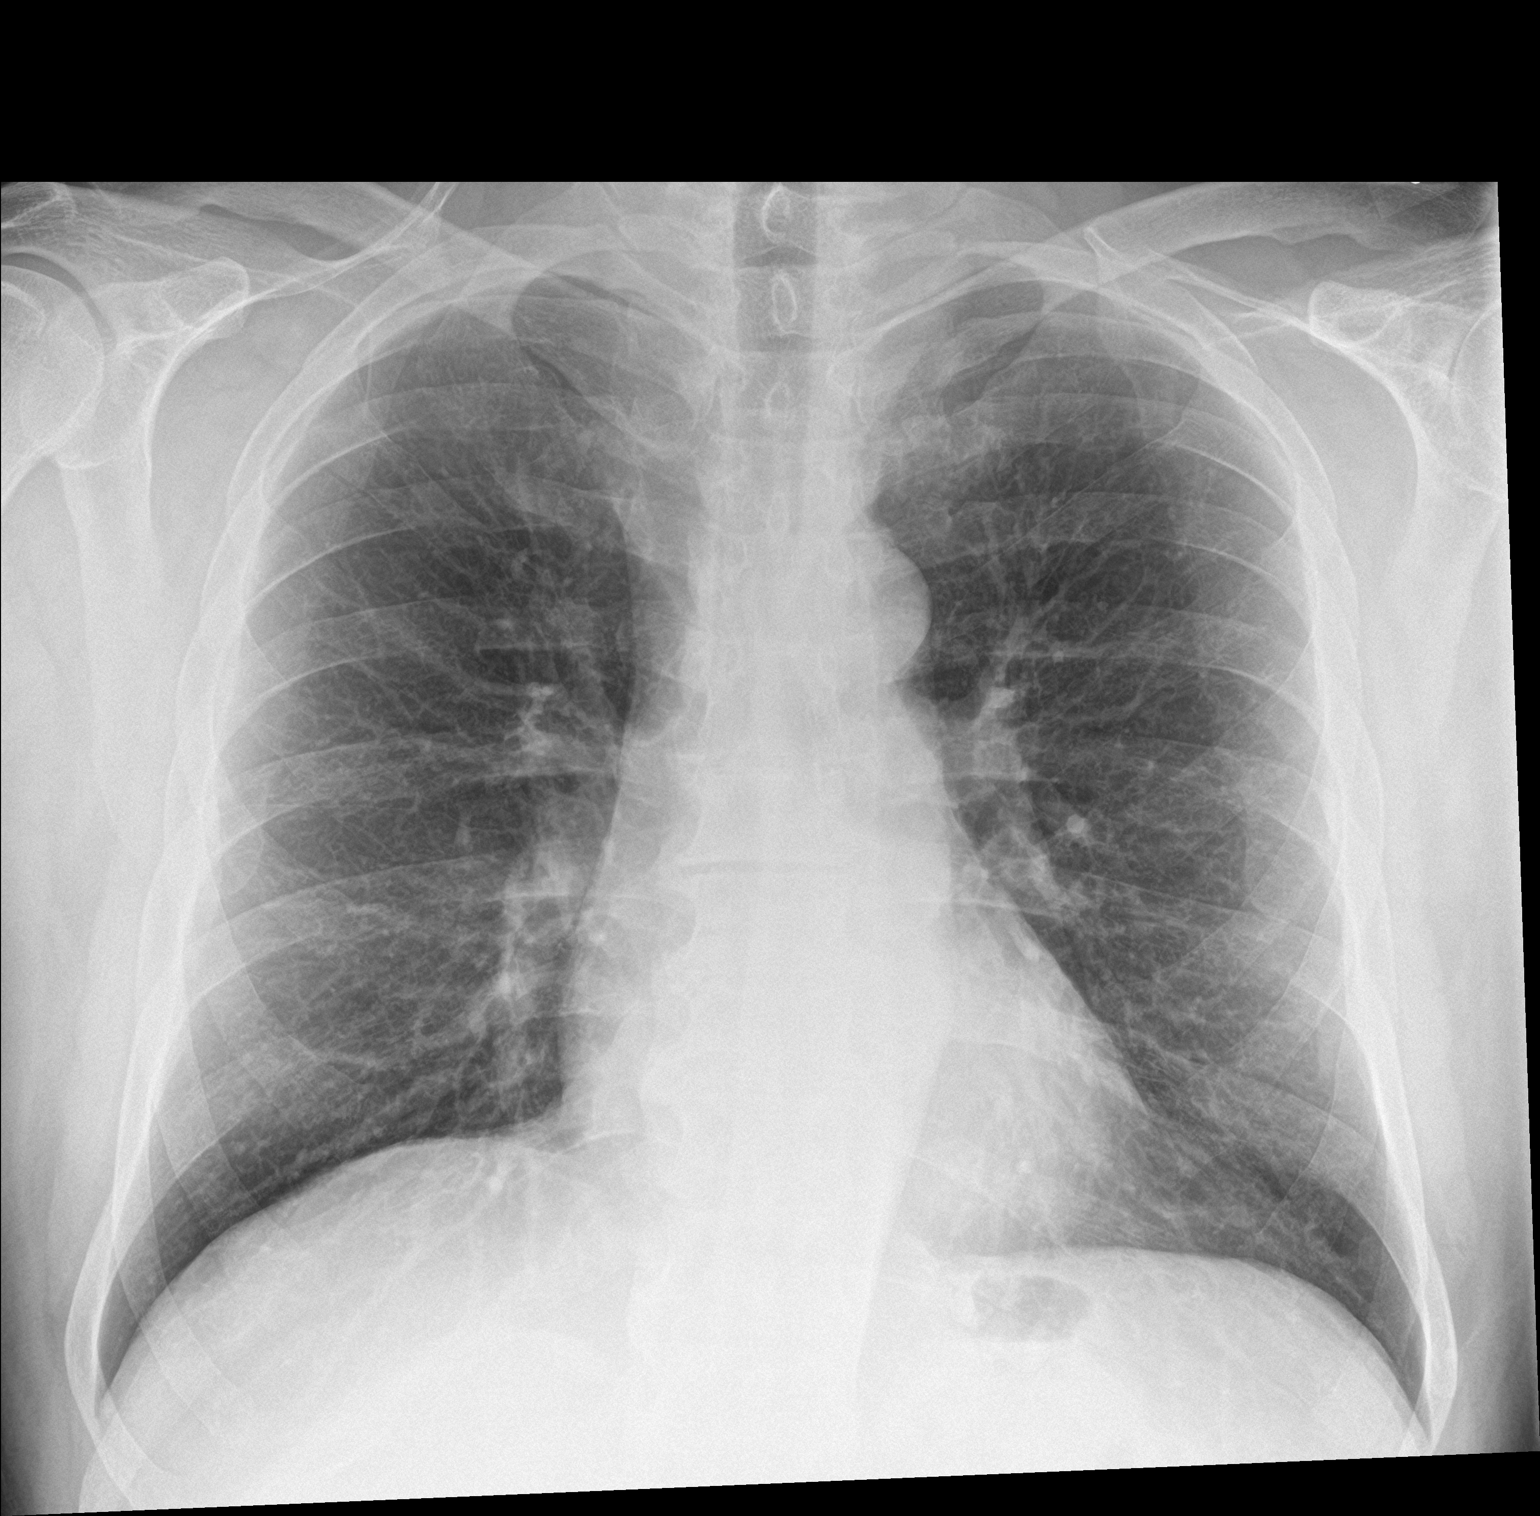

[chest lat]
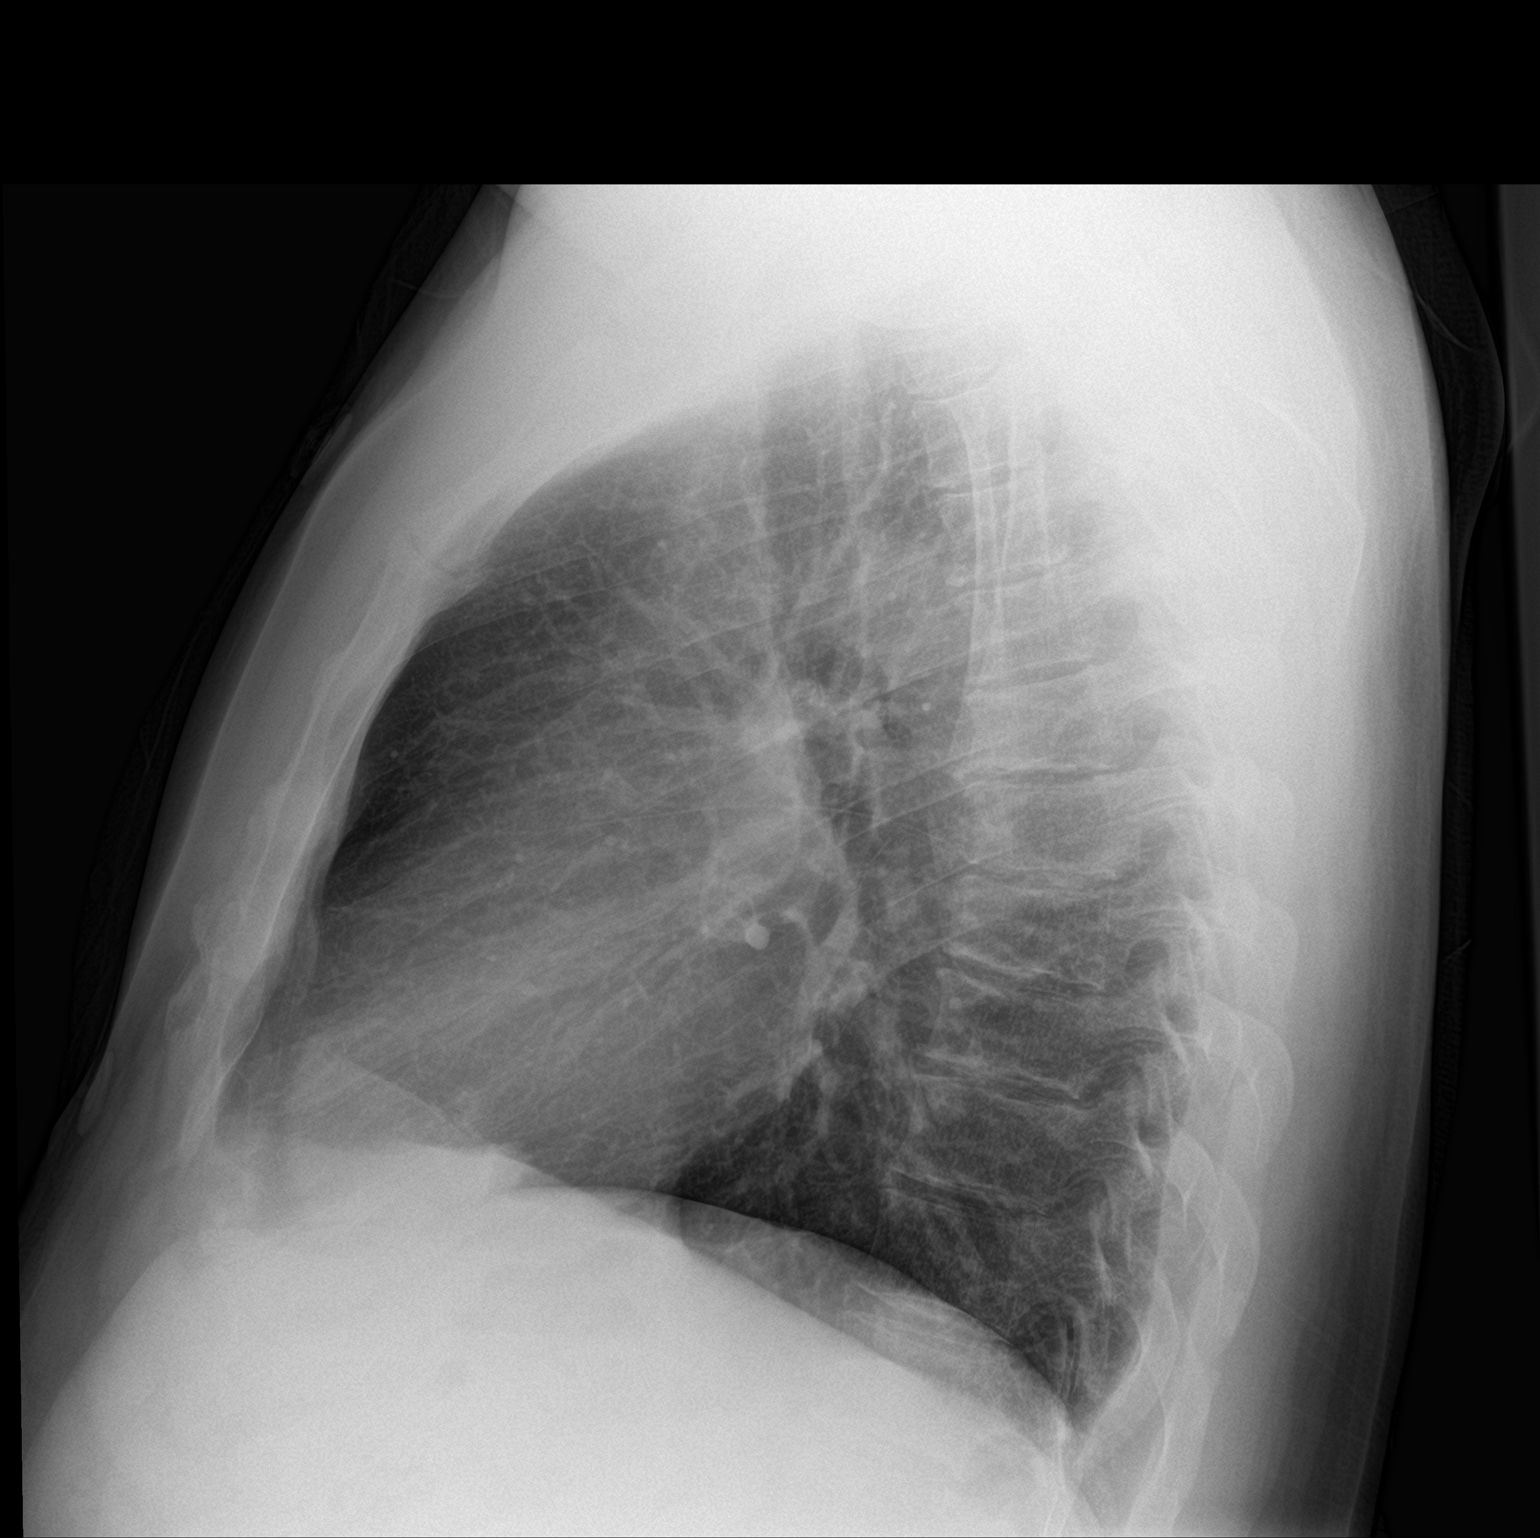

[2 of 2 positions shown; findings below may reference images not displayed]

FINDINGS: The heart size and mediastinal contours are within normal limits.
Both lungs are clear. The visualized skeletal structures are
unremarkable.
IMPRESSION: No active cardiopulmonary disease.

## 2019-02-22 ENCOUNTER — Other Ambulatory Visit (HOSPITAL_COMMUNITY)
Admission: RE | Admit: 2019-02-22 | Discharge: 2019-02-22 | Disposition: A | Discharge status: Home / Self Care | Payer: BC Managed Care – PPO | Source: Ambulatory Visit | Attending: Cardiology | Admitting: Cardiology

## 2019-02-22 DIAGNOSIS — I4891 Unspecified atrial fibrillation: Secondary | ICD-10-CM | POA: Diagnosis not present

## 2019-02-22 DIAGNOSIS — Z20828 Contact with and (suspected) exposure to other viral communicable diseases: Secondary | ICD-10-CM | POA: Insufficient documentation

## 2019-02-22 DIAGNOSIS — Z01818 Encounter for other preprocedural examination: Secondary | ICD-10-CM | POA: Diagnosis not present

## 2019-02-22 DIAGNOSIS — Z01812 Encounter for preprocedural laboratory examination: Secondary | ICD-10-CM | POA: Diagnosis not present

## 2019-02-22 LAB — BASIC METABOLIC PANEL
BUN/Creatinine Ratio: 12 (ref 9–20)
BUN: 12 mg/dL (ref 6–24)
CO2: 18 mmol/L — ABNORMAL LOW (ref 20–29)
Calcium: 9.3 mg/dL (ref 8.7–10.2)
Chloride: 107 mmol/L — ABNORMAL HIGH (ref 96–106)
Creatinine, Ser: 1 mg/dL (ref 0.76–1.27)
GFR calc Af Amer: 100 mL/min/{1.73_m2} (ref >59)
GFR calc non Af Amer: 86 mL/min/{1.73_m2} (ref >59)
Glucose: 115 mg/dL — ABNORMAL HIGH (ref 65–99)
Potassium: 4.8 mmol/L (ref 3.5–5.2)
Sodium: 143 mmol/L (ref 134–144)

## 2019-02-22 LAB — CBC
Hematocrit: 41 % (ref 37.5–51.0)
Hemoglobin: 13.8 g/dL (ref 13.0–17.7)
MCH: 31.1 pg (ref 26.6–33.0)
MCHC: 33.7 g/dL (ref 31.5–35.7)
MCV: 92 fL (ref 79–97)
Platelets: 262 10*3/uL (ref 150–450)
RBC: 4.44 x10E6/uL (ref 4.14–5.80)
RDW: 12 % (ref 11.6–15.4)
WBC: 7.7 10*3/uL (ref 3.4–10.8)

## 2019-02-22 LAB — SARS CORONAVIRUS 2 (TAT 6-24 HRS): SARS Coronavirus 2: NEGATIVE

## 2019-02-24 NOTE — Progress Notes (Signed)
Attempted pre-call for procedure tomorrow there was no answer, and he had an automated voicemail so I did not leave a message.

## 2019-02-25 ENCOUNTER — Encounter (HOSPITAL_COMMUNITY)
Admission: RE | Disposition: A | Discharge status: Home / Self Care | Payer: Self-pay | Source: Home / Self Care | Attending: Cardiology

## 2019-02-25 ENCOUNTER — Other Ambulatory Visit: Payer: Self-pay

## 2019-02-25 ENCOUNTER — Encounter (HOSPITAL_COMMUNITY): Payer: Self-pay

## 2019-02-25 ENCOUNTER — Ambulatory Visit (HOSPITAL_COMMUNITY): Payer: BC Managed Care – PPO | Admitting: Certified Registered"

## 2019-02-25 ENCOUNTER — Ambulatory Visit (HOSPITAL_COMMUNITY)
Admission: RE | Admit: 2019-02-25 | Discharge: 2019-02-25 | Disposition: A | Discharge status: Home / Self Care | Payer: BC Managed Care – PPO | Attending: Cardiology | Admitting: Cardiology

## 2019-02-25 DIAGNOSIS — Z79899 Other long term (current) drug therapy: Secondary | ICD-10-CM | POA: Insufficient documentation

## 2019-02-25 DIAGNOSIS — X58XXXA Exposure to other specified factors, initial encounter: Secondary | ICD-10-CM | POA: Insufficient documentation

## 2019-02-25 DIAGNOSIS — M1990 Unspecified osteoarthritis, unspecified site: Secondary | ICD-10-CM | POA: Diagnosis not present

## 2019-02-25 DIAGNOSIS — E663 Overweight: Secondary | ICD-10-CM | POA: Diagnosis not present

## 2019-02-25 DIAGNOSIS — K219 Gastro-esophageal reflux disease without esophagitis: Secondary | ICD-10-CM | POA: Diagnosis not present

## 2019-02-25 DIAGNOSIS — Z96643 Presence of artificial hip joint, bilateral: Secondary | ICD-10-CM | POA: Insufficient documentation

## 2019-02-25 DIAGNOSIS — T50995A Adverse effect of other drugs, medicaments and biological substances, initial encounter: Secondary | ICD-10-CM | POA: Diagnosis not present

## 2019-02-25 DIAGNOSIS — Z87891 Personal history of nicotine dependence: Secondary | ICD-10-CM | POA: Diagnosis not present

## 2019-02-25 DIAGNOSIS — G4730 Sleep apnea, unspecified: Secondary | ICD-10-CM | POA: Diagnosis not present

## 2019-02-25 DIAGNOSIS — L271 Localized skin eruption due to drugs and medicaments taken internally: Secondary | ICD-10-CM | POA: Diagnosis not present

## 2019-02-25 DIAGNOSIS — I4819 Other persistent atrial fibrillation: Secondary | ICD-10-CM | POA: Diagnosis not present

## 2019-02-25 DIAGNOSIS — I209 Angina pectoris, unspecified: Secondary | ICD-10-CM | POA: Diagnosis not present

## 2019-02-25 HISTORY — PX: CARDIOVERSION: SHX1299

## 2019-02-25 SURGERY — CARDIOVERSION
Anesthesia: General

## 2019-02-25 MED ORDER — SODIUM CHLORIDE 0.9 % IV SOLN
INTRAVENOUS | Status: DC | PRN
  Administered 2019-02-25: 13:00:00 via INTRAVENOUS

## 2019-02-25 MED ORDER — LIDOCAINE HCL (CARDIAC) PF 100 MG/5ML IV SOSY
PREFILLED_SYRINGE | INTRAVENOUS | Status: DC | PRN
  Administered 2019-02-25: 30 mg via INTRAVENOUS

## 2019-02-25 MED ORDER — PROPOFOL 10 MG/ML IV BOLUS
INTRAVENOUS | Status: DC | PRN
  Administered 2019-02-25: 100 mg via INTRAVENOUS

## 2019-02-25 NOTE — CV Procedure (Signed)
Procedure:   DCCV  Indication:  Symptomatic atrial fibrillation  Procedure Note:  The patient signed informed consent.  They have had had therapeutic anticoagulation with Xarelto greater than 3 weeks.  Anesthesia was administered by Dr. Glennon Mac.  Adequate airway was maintained throughout and vital followed per protocol.  They were cardioverted x 1 with 200J of biphasic synchronized energy.  They converted to NSR.  There were no apparent complications.  The patient had normal neuro status and respiratory status post procedure with vitals stable as recorded elsewhere.    Follow up:  They will continue on current medical therapy and follow up with cardiology as scheduled.  Oswaldo Milian, MD 02/25/2019 2:16 PM

## 2019-02-25 NOTE — Interval H&P Note (Signed)
History and Physical Interval Note:  02/25/2019 12:54 PM  Ryan [name]  has presented today for surgery, with the diagnosis of AFIB.  The various methods of treatment have been discussed with the patient and family. After consideration of risks, benefits and other options for treatment, the patient has consented to  Procedure(s): CARDIOVERSION (N/A) as a surgical intervention.  The patient's history has been reviewed, patient examined, no change in status, stable for surgery.  I have reviewed the patient's chart and labs.  Questions were answered to the patient's satisfaction.     [name] Rolena Knutson   

## 2019-02-25 NOTE — Anesthesia Preprocedure Evaluation (Addendum)
Anesthesia Evaluation  Patient identified by MRN, date of birth, ID band Patient awake    Reviewed: Allergy & Precautions, NPO status , Patient's Chart, lab work & pertinent test results, reviewed documented beta blocker date and time   History of Anesthesia Complications Negative for: history of anesthetic complications  Airway Mallampati: I  TM Distance: >3 FB Neck ROM: Full    Dental  (+) Dental Advisory Given   Pulmonary sleep apnea and Continuous Positive Airway Pressure Ventilation , former smoker (quit 2018),  02/22/2019 SARS coronavirus NEG   breath sounds clear to auscultation       Cardiovascular hypertension, Pt. on medications and Pt. on home beta blockers (-) angina+ dysrhythmias Atrial Fibrillation  Rhythm:Irregular Rate:Normal  12/29/2018 cath: Essentially normal epicardial coronary arteries without obstructive disease. Normal global LV function with EF estimated 50%   Neuro/Psych Anxiety negative neurological ROS     GI/Hepatic Neg liver ROS, GERD  Controlled,  Endo/Other  negative endocrine ROS  Renal/GU negative Renal ROS     Musculoskeletal  (+) Arthritis , Osteoarthritis,    Abdominal   Peds  Hematology xarelto   Anesthesia Other Findings   Reproductive/Obstetrics                            Anesthesia Physical Anesthesia Plan  ASA: III  Anesthesia Plan: General   Post-op Pain Management:    Induction: Intravenous  PONV Risk Score and Plan: 2 and Treatment may vary due to age or medical condition  Airway Management Planned: Natural Airway and Mask  Additional Equipment:   Intra-op Plan:   Post-operative Plan:   Informed Consent: I have reviewed the patients History and Physical, chart, labs and discussed the procedure including the risks, benefits and alternatives for the proposed anesthesia with the patient or authorized representative who has indicated  his/her understanding and acceptance.     Dental advisory given  Plan Discussed with: CRNA and Surgeon  Anesthesia Plan Comments:        Anesthesia Quick Evaluation

## 2019-02-25 NOTE — Interval H&P Note (Signed)
History and Physical Interval Note:  02/25/2019 12:54 PM  Ryan [name]  has presented today for surgery, with the diagnosis of AFIB.  The various methods of treatment have been discussed with the patient and family. After consideration of risks, benefits and other options for treatment, the patient has consented to  Procedure(s): CARDIOVERSION (N/A) as a surgical intervention.  The patient's history has been reviewed, patient examined, no change in status, stable for surgery.  I have reviewed the patient's chart and labs.  Questions were answered to the patient's satisfaction.     Donato Heinz

## 2019-02-25 NOTE — Anesthesia Procedure Notes (Signed)
Date/Time: 02/25/2019 1:06 PM Performed by: Jearld Pies, CRNA Pre-anesthesia Checklist: Patient identified, Emergency Drugs available, Suction available and Patient being monitored Patient Re-evaluated:Patient Re-evaluated prior to induction Oxygen Delivery Method: Ambu bag Preoxygenation: Pre-oxygenation with 100% oxygen Induction Type: IV induction

## 2019-02-25 NOTE — Discharge Instructions (Signed)

## 2019-02-25 NOTE — Transfer of Care (Signed)
Immediate Anesthesia Transfer of Care Note  Patient: Ryan [name]  Procedure(s) Performed: CARDIOVERSION (N/A )  Patient Location: Endoscopy Unit  Anesthesia Type:General  Level of Consciousness: awake, alert  and oriented  Airway & Oxygen Therapy: Patient Spontanous Breathing  Post-op Assessment: Report given to RN and Post -op Vital signs reviewed and stable  Post vital signs: Reviewed and stable  Last Vitals:  Vitals Value Taken Time  BP 124/83   Temp    Pulse 59   Resp 16   SpO2 98     Last Pain:  Vitals:   02/25/19 1124  TempSrc: Temporal  PainSc: 3          Complications: No apparent anesthesia complications

## 2019-02-25 NOTE — Anesthesia Postprocedure Evaluation (Signed)
Anesthesia Post Note  Patient: LAVANTE TOSO  Procedure(s) Performed: CARDIOVERSION (N/A )     Patient location during evaluation: Endoscopy Anesthesia Type: General Level of consciousness: awake and alert, patient cooperative and oriented Pain management: pain level controlled Vital Signs Assessment: post-procedure vital signs reviewed and stable Respiratory status: spontaneous breathing, nonlabored ventilation and respiratory function stable Cardiovascular status: blood pressure returned to baseline and stable Postop Assessment: no apparent nausea or vomiting Anesthetic complications: no    Last Vitals:  Vitals:   02/25/19 1340 02/25/19 1350  BP:  (P) 129/89  Pulse: (!) 57 (!) 57  Resp: 14 17  Temp:    SpO2: 97% 99%    Last Pain:  Vitals:   02/25/19 1331  TempSrc:   PainSc: 0-No pain                 Tuwanna Krausz,E. Victoriah Wilds

## 2019-02-26 ENCOUNTER — Encounter (HOSPITAL_COMMUNITY): Payer: Self-pay | Admitting: Cardiology

## 2019-03-01 DIAGNOSIS — Z5189 Encounter for other specified aftercare: Secondary | ICD-10-CM | POA: Diagnosis not present

## 2019-03-01 DIAGNOSIS — Z96659 Presence of unspecified artificial knee joint: Secondary | ICD-10-CM | POA: Diagnosis not present

## 2019-03-15 DIAGNOSIS — G4733 Obstructive sleep apnea (adult) (pediatric): Secondary | ICD-10-CM | POA: Diagnosis not present

## 2019-03-15 DIAGNOSIS — Z96651 Presence of right artificial knee joint: Secondary | ICD-10-CM | POA: Diagnosis not present

## 2019-03-15 DIAGNOSIS — Z471 Aftercare following joint replacement surgery: Secondary | ICD-10-CM | POA: Diagnosis not present

## 2019-03-22 DIAGNOSIS — Z0000 Encounter for general adult medical examination without abnormal findings: Secondary | ICD-10-CM | POA: Diagnosis not present

## 2019-03-22 DIAGNOSIS — K219 Gastro-esophageal reflux disease without esophagitis: Secondary | ICD-10-CM | POA: Diagnosis not present

## 2019-03-22 DIAGNOSIS — E785 Hyperlipidemia, unspecified: Secondary | ICD-10-CM | POA: Diagnosis not present

## 2019-03-22 DIAGNOSIS — I480 Paroxysmal atrial fibrillation: Secondary | ICD-10-CM | POA: Diagnosis not present

## 2019-03-22 DIAGNOSIS — Z125 Encounter for screening for malignant neoplasm of prostate: Secondary | ICD-10-CM | POA: Diagnosis not present

## 2019-03-22 DIAGNOSIS — Z87891 Personal history of nicotine dependence: Secondary | ICD-10-CM | POA: Diagnosis not present

## 2019-03-22 DIAGNOSIS — G479 Sleep disorder, unspecified: Secondary | ICD-10-CM | POA: Diagnosis not present

## 2019-03-22 DIAGNOSIS — M25569 Pain in unspecified knee: Secondary | ICD-10-CM | POA: Diagnosis not present

## 2019-03-22 DIAGNOSIS — Z23 Encounter for immunization: Secondary | ICD-10-CM | POA: Diagnosis not present

## 2019-03-23 ENCOUNTER — Other Ambulatory Visit: Payer: Self-pay | Admitting: Internal Medicine

## 2019-03-25 ENCOUNTER — Encounter: Payer: Self-pay | Admitting: Cardiology

## 2019-03-25 ENCOUNTER — Ambulatory Visit (INDEPENDENT_AMBULATORY_CARE_PROVIDER_SITE_OTHER): Payer: BC Managed Care – PPO | Admitting: Cardiology

## 2019-03-25 ENCOUNTER — Other Ambulatory Visit: Payer: Self-pay

## 2019-03-25 VITALS — BP 124/84 | HR 68 | Temp 97.4°F | Ht 75.0 in | Wt 241.0 lb

## 2019-03-25 DIAGNOSIS — Z7901 Long term (current) use of anticoagulants: Secondary | ICD-10-CM

## 2019-03-25 DIAGNOSIS — IMO0001 Reserved for inherently not codable concepts without codable children: Secondary | ICD-10-CM

## 2019-03-25 DIAGNOSIS — I4819 Other persistent atrial fibrillation: Secondary | ICD-10-CM | POA: Diagnosis not present

## 2019-03-25 DIAGNOSIS — T50905D Adverse effect of unspecified drugs, medicaments and biological substances, subsequent encounter: Secondary | ICD-10-CM | POA: Diagnosis not present

## 2019-03-25 DIAGNOSIS — Z0389 Encounter for observation for other suspected diseases and conditions ruled out: Secondary | ICD-10-CM | POA: Diagnosis not present

## 2019-03-25 DIAGNOSIS — G4730 Sleep apnea, unspecified: Secondary | ICD-10-CM

## 2019-03-25 MED ORDER — ATENOLOL 25 MG PO TABS: 25.0000 mg | ORAL_TABLET | Freq: Every day | ORAL | 2 refills | Status: DC

## 2019-03-25 MED ORDER — RIVAROXABAN 20 MG PO TABS: 20.0000 mg | ORAL_TABLET | Freq: Every day | ORAL | 2 refills | Status: DC

## 2019-03-25 NOTE — Assessment & Plan Note (Signed)
CHADS VASC= 0

## 2019-03-25 NOTE — Assessment & Plan Note (Signed)
Cath June 2020

## 2019-03-25 NOTE — Assessment & Plan Note (Addendum)
On C-pap 

## 2019-03-25 NOTE — Progress Notes (Signed)
Cardiology Office Note:    Date:  03/25/2019   ID:  Ryan [name], DOB 1965/08/31, MRN [id number]  PCP:  [facility]  Cardiologist:  Parke Poisson, MD  Electrophysiologist:  None   Referring MD: Roe Coombs*   No chief complaint on file.   History of Present Illness:    Ryan [name] is a 53 y.o. male with a hx of PAF documented initially by EKG in Jan 2020 on an ED visit but apparently not noticed till he went for Rt knee replacement in May 2020.  He saw Dr Jacques Navy 12/23/2018 and because of his history of chest pain was set up for OP cath.  This was done 12/29/2018 and showed normal coronaries. Eliquis was added and the plan was for him to have OP DCCV.  Echo 12/31/2018 showed normal LVF with an EF of 60-65% and normal LA size.  He was seen in the office prior to scheduled OP cardioversion 01/26/2019. He had an allergic reaction to Eliquis and was changed to Xarelto. He ultimatley underwent OP DCCV 02/25/2019.  He is in the office today for follow up.  His cardioversion was successful but in the office today he is in AF with VR 67.  He was disappointed to hear that.  It's not clear he is symptomatic, some of his exertional fatigue symptoms he had attributed to slow recovery from his knee surgery and in fact are improving.  I told him we have many patients in AF that are asymptomatic but he told he he wants to pursue further efforts to get him into NSR.  I explained that he would need to continue Xarelto either way (he works as a Curator).    Past Medical History:  Diagnosis Date  . Anxiety   . Arthritis    oa  . Chest pain since 02-11-2013   pt thinks associated with reflux  . Dyspnea    with exertion  . GERD (gastroesophageal reflux disease)   . H/O hiatal hernia     Past Surgical History:  Procedure Laterality Date  . ANKLE SURGERY Right    reconstruction to muscle  . CARDIOVERSION N/A 02/25/2019   Procedure: CARDIOVERSION;  Surgeon:  Little Ishikawa, MD;  Location: [facility] ENDOSCOPY;  Service: Endoscopy;  Laterality: N/A;  . ESOPHAGEAL MANOMETRY N/A 04/17/2015   Procedure: ESOPHAGEAL MANOMETRY (EM);  Surgeon: Charna Elizabeth, MD;  Location: [facility] ENDOSCOPY;  Service: Endoscopy;  Laterality: N/A;  . ESOPHAGOGASTRODUODENOSCOPY  11/09/2003  . FOOT SURGERY     right  . HIP SURGERY  age 49   left, done x 2  . KNEE ARTHROSCOPY Left 10/20/2003 and 2012  . LEFT HEART CATH AND CORONARY ANGIOGRAPHY N/A 12/29/2018   Procedure: LEFT HEART CATH AND CORONARY ANGIOGRAPHY;  Surgeon: Lennette Bihari, MD;  Location: [facility] INVASIVE CV LAB;  Service: Cardiovascular;  Laterality: N/A;  . MYRINGOTOMY WITH TUBE PLACEMENT Right 08/29/2015   Procedure: MYRINGOTOMY WITH TUBE PLACEMENT;  Surgeon: Newman Pies, MD;  Location: [facility];  Service: ENT;  Laterality: Right;  . PH IMPEDANCE STUDY N/A 04/17/2015   Procedure: PH IMPEDANCE STUDY;  Surgeon: Charna Elizabeth, MD;  Location: [facility] ENDOSCOPY;  Service: Endoscopy;  Laterality: N/A;  . SEPTOPLASTY N/A 08/29/2015   Procedure: SEPTOPLASTY;  Surgeon: Newman Pies, MD;  Location: [facility];  Service: ENT;  Laterality: N/A;  . TOTAL HIP ARTHROPLASTY Left 02/23/2013   Procedure: LEFT TOTAL HIP ARTHROPLASTY ANTERIOR APPROACH;  Surgeon: Madlyn Frankel  Charlann Boxerlin, MD;  Location: [facility] ORS;  Service: Orthopedics;  Laterality: Left;  . TOTAL KNEE ARTHROPLASTY Right 11/11/2018   Procedure: TOTAL KNEE ARTHROPLASTY;  Surgeon: Ranee GosselinGioffre, Ronald, MD;  Location: [facility] ORS;  Service: Orthopedics;  Laterality: Right;  120min  . TURBINATE REDUCTION Bilateral 08/29/2015   Procedure: TURBINATE REDUCTION;  Surgeon: Newman PiesSu Teoh, MD;  Location: [facility];  Service: ENT;  Laterality: Bilateral;  . URETHRA SURGERY   3251yrs ago   for scar tissue    Current Medications: Current Meds  Medication Sig  . atenolol (TENORMIN) 25 MG tablet Take 1 tablet (25 mg total) by mouth daily.  . diphenhydrAMINE (BENADRYL ALLERGY) 25 mg capsule  Take 1 capsule (25 mg total) by mouth every 6 (six) hours as needed. (Patient taking differently: Take 25 mg by mouth every 4 (four) hours as needed for allergies. )  . gabapentin (NEURONTIN) 300 MG capsule Take 600 mg by mouth at bedtime.  . pantoprazole (PROTONIX) 40 MG tablet Take 1 tablet (40 mg total) by mouth daily. (Patient taking differently: Take 40 mg by mouth daily as needed (acid reflux). )  . rivaroxaban (XARELTO) 20 MG TABS tablet Take 1 tablet (20 mg total) by mouth daily with supper.  . [DISCONTINUED] atenolol (TENORMIN) 25 MG tablet Take 1 tablet (25 mg total) by mouth daily.  . [DISCONTINUED] rivaroxaban (XARELTO) 20 MG TABS tablet Take 1 tablet (20 mg total) by mouth daily with supper.     Allergies:   Eliquis [apixaban]   Social History   Socioeconomic History  . Marital status: Married    Spouse name: Not on file  . Number of children: Not on file  . Years of education: Not on file  . Highest education level: Not on file  Occupational History  . Not on file  Social Needs  . Financial resource strain: Not on file  . Food insecurity    Worry: Not on file    Inability: Not on file  . Transportation needs    Medical: Not on file    Non-medical: Not on file  Tobacco Use  . Smoking status: Former Smoker    Packs/day: 1.50    Years: 25.00    Pack years: 37.50    Types: Cigarettes    Quit date: 05/29/2017    Years since quitting: 1.8  . Smokeless tobacco: Never Used  . Tobacco comment: quit 2018  Substance and Sexual Activity  . Alcohol use: Yes    Comment: ocassionally  . Drug use: Yes    Types: Marijuana    Comment: occ marijuana last used 1 week ago  . Sexual activity: Not on file  Lifestyle  . Physical activity    Days per week: Not on file    Minutes per session: Not on file  . Stress: Not on file  Relationships  . Social Musicianconnections    Talks on phone: Not on file    Gets together: Not on file    Attends religious service: Not on file    Active  member of club or organization: Not on file    Attends meetings of clubs or organizations: Not on file    Relationship status: Not on file  Other Topics Concern  . Not on file  Social History Narrative  . Not on file     Family History: The patient's family history includes Atrial fibrillation in his father.  ROS:   Please see the history of present illness.    All  other systems reviewed and are negative.  EKGs/Labs/Other Studies Reviewed:    The following studies were reviewed today: Echo 01/18/2019 OP DCCV 02/25/2019  EKG:  EKG is ordered today.  The ekg ordered today demonstrates AF with VR 67  Recent Labs: 12/23/2018: ALT 19; TSH 0.998 02/22/2019: BUN 12; Creatinine, Ser 1.00; Hemoglobin 13.8; Platelets 262; Potassium 4.8; Sodium 143  Recent Lipid Panel No results found for: CHOL, TRIG, HDL, CHOLHDL, VLDL, LDLCALC, LDLDIRECT  Physical Exam:    VS:  BP 124/84   Pulse 68   Temp (!) 97.4 F (36.3 C)   Ht 6\' 3"  (1.905 m)   Wt 241 lb (109.3 kg)   SpO2 97%   BMI 30.12 kg/m     Wt Readings from Last 3 Encounters:  03/25/19 241 lb (109.3 kg)  02/25/19 240 lb (108.9 kg)  01/26/19 238 lb 14.4 oz (108.4 kg)     GEN: Well nourished, well developed in no acute distress HEENT: Normal NECK: No JVD; No carotid bruits LYMPHATICS: No lymphadenopathy CARDIAC: irregularly irregular, no murmurs, rubs, gallops RESPIRATORY:  Clear to auscultation without rales, wheezing or rhonchi  ABDOMEN: Soft, non-tender, non-distended MUSCULOSKELETAL:  No edema; No deformity  SKIN: Warm and dry, multiple tattoos  NEUROLOGIC:  Alert and oriented x 3 PSYCHIATRIC:  Normal affect   ASSESSMENT:    Persistent atrial fibrillation DCCV 02/25/2019- back in AF at [facility] 03/25/2019  Normal coronary arteries Cath June 2020  Sleep apnea On C-pap  Anticoagulated CHADS VASC= 0  PLAN:    I suggested referral to the AF clinic to discuss further options.    Medication Adjustments/Labs and Tests  Ordered: Current medicines are reviewed at length with the patient today.  Concerns regarding medicines are outlined above.  Orders Placed This Encounter  Procedures  . Amb Referral to AFIB Clinic   Meds ordered this encounter  Medications  . rivaroxaban (XARELTO) 20 MG TABS tablet    Sig: Take 1 tablet (20 mg total) by mouth daily with supper.    Dispense:  90 tablet    Refill:  2  . atenolol (TENORMIN) 25 MG tablet    Sig: Take 1 tablet (25 mg total) by mouth daily.    Dispense:  90 tablet    Refill:  2    Patient Instructions  Medication Instructions:  Your physician recommends that you continue on your current medications as directed. Please refer to the Current Medication list given to you today. If you need a refill on your cardiac medications before your next appointment, please call your pharmacy.   Lab work: None  If you have labs (blood work) drawn today and your tests are completely normal, you will receive your results only by: Marland Kitchen MyChart Message (if you have MyChart) OR . A paper copy in the mail If you have any lab test that is abnormal or we need to change your treatment, we will call you to review the results.  Testing/Procedures: None   Follow-Up: At [facility], you and your health needs are our priority.  As part of our continuing mission to provide you with exceptional heart care, we have created designated Provider Care Teams.  These Care Teams include your primary Cardiologist (physician) and Advanced Practice Providers (APPs -  Physician Assistants and Nurse Practitioners) who all work together to provide you with the care you need, when you need it.  . AFTER BEING TAKEN CARE OF AT THE AFIB CLINIC  Any Other Special Instructions Will Be Listed  Below (If Applicable). You have been referred to A FIB CLINIC     Signed, Corine Shelter, [place]  03/25/2019 9:48 AM    [facility]

## 2019-03-25 NOTE — Assessment & Plan Note (Signed)
DCCV 02/25/2019- back in AF at [place] 03/25/2019

## 2019-03-25 NOTE — Patient Instructions (Signed)
Medication Instructions:  Your physician recommends that you continue on your current medications as directed. Please refer to the Current Medication list given to you today. If you need a refill on your cardiac medications before your next appointment, please call your pharmacy.   Lab work: None  If you have labs (blood work) drawn today and your tests are completely normal, you will receive your results only by: Marland Kitchen MyChart Message (if you have MyChart) OR . A paper copy in the mail If you have any lab test that is abnormal or we need to change your treatment, we will call you to review the results.  Testing/Procedures: None   Follow-Up: At [facility], you and your health needs are our priority.  As part of our continuing mission to provide you with exceptional heart care, we have created designated Provider Care Teams.  These Care Teams include your primary Cardiologist (physician) and Advanced Practice Providers (APPs -  Physician Assistants and Nurse Practitioners) who all work together to provide you with the care you need, when you need it.  . AFTER BEING TAKEN CARE OF AT THE AFIB CLINIC  Any Other Special Instructions Will Be Listed Below (If Applicable). You have been referred to [place]

## 2019-03-26 ENCOUNTER — Other Ambulatory Visit (INDEPENDENT_AMBULATORY_CARE_PROVIDER_SITE_OTHER): Payer: BC Managed Care – PPO

## 2019-03-26 DIAGNOSIS — I4819 Other persistent atrial fibrillation: Secondary | ICD-10-CM | POA: Diagnosis not present

## 2019-03-26 DIAGNOSIS — I4891 Unspecified atrial fibrillation: Secondary | ICD-10-CM

## 2019-03-26 NOTE — Progress Notes (Signed)
EKG order was placed. Was performed on 03/25/2019

## 2019-03-29 ENCOUNTER — Ambulatory Visit (HOSPITAL_COMMUNITY)
Admission: RE | Admit: 2019-03-29 | Discharge: 2019-03-29 | Disposition: A | Discharge status: Home / Self Care | Payer: BC Managed Care – PPO | Source: Ambulatory Visit | Attending: Physician Assistant | Admitting: Physician Assistant

## 2019-03-29 ENCOUNTER — Other Ambulatory Visit: Payer: Self-pay

## 2019-03-29 ENCOUNTER — Encounter (HOSPITAL_COMMUNITY): Payer: Self-pay | Admitting: Physician Assistant

## 2019-03-29 VITALS — BP 116/88 | HR 90 | Ht 75.0 in | Wt 240.4 lb

## 2019-03-29 DIAGNOSIS — K219 Gastro-esophageal reflux disease without esophagitis: Secondary | ICD-10-CM | POA: Diagnosis not present

## 2019-03-29 DIAGNOSIS — Z6830 Body mass index (BMI) 30.0-30.9, adult: Secondary | ICD-10-CM | POA: Diagnosis not present

## 2019-03-29 DIAGNOSIS — Z87891 Personal history of nicotine dependence: Secondary | ICD-10-CM | POA: Insufficient documentation

## 2019-03-29 DIAGNOSIS — Z79899 Other long term (current) drug therapy: Secondary | ICD-10-CM | POA: Diagnosis not present

## 2019-03-29 DIAGNOSIS — Z0389 Encounter for observation for other suspected diseases and conditions ruled out: Secondary | ICD-10-CM | POA: Diagnosis not present

## 2019-03-29 DIAGNOSIS — G4733 Obstructive sleep apnea (adult) (pediatric): Secondary | ICD-10-CM | POA: Insufficient documentation

## 2019-03-29 DIAGNOSIS — E669 Obesity, unspecified: Secondary | ICD-10-CM | POA: Insufficient documentation

## 2019-03-29 DIAGNOSIS — T50905D Adverse effect of unspecified drugs, medicaments and biological substances, subsequent encounter: Secondary | ICD-10-CM | POA: Insufficient documentation

## 2019-03-29 DIAGNOSIS — I4819 Other persistent atrial fibrillation: Secondary | ICD-10-CM

## 2019-03-29 DIAGNOSIS — Z96659 Presence of unspecified artificial knee joint: Secondary | ICD-10-CM | POA: Diagnosis not present

## 2019-03-29 MED ORDER — FLECAINIDE ACETATE 50 MG PO TABS: 50.0000 mg | ORAL_TABLET | Freq: Two times a day (BID) | ORAL | 3 refills | Status: DC

## 2019-03-29 NOTE — Patient Instructions (Signed)
Start Flecainide 50mg twice a day 

## 2019-03-29 NOTE — Progress Notes (Signed)
Primary Care Physician: [facility] Cardiologist: Dr Margaretann Loveless Primary Electrophysiologist: none Referring Physician: Kerin Ransom PA-C   Ryan [name] is a 53 y.o. male with a history of persistent atrial fibrillation and OSA who presents for consultation in the [facility].  The patient was initially diagnosed with atrial fibrillation 07/2018 at an ED visit but apparently went unnoticed until a preop appt for knee replacement in 10/2018. He saw Dr Margaretann Loveless 12/23/2018 and because of his history of chest pain was set up for OP cath. This was done 12/29/2018 and showed normal coronaries. Eliquis was added and the plan was for him to have OP DCCV. Echo 12/31/2018 showed normal LVF with an EF of 60-65% and normal LA size. He had an allergic reaction to Eliquis and was changed to Xarelto. He ultimatley underwent OP DCCV 02/25/2019. Unfortunately, patient had ERAF on follow up after DCCV. He states that immediately after his DCCV he felt better with more energy and less SOB. Of note, he has been on prednisone for his knee pain. He denies any significant alcohol use. He does have diagnosis of OSA and has used CPAP intermittently.   Today, he denies symptoms of palpitations, chest pain, orthopnea, PND, lower extremity edema, dizziness, presyncope, syncope,bleeding, or neurologic sequela. The patient is tolerating medications without difficulties and is otherwise without complaint today. +fatigue   Atrial Fibrillation Risk Factors:  he does have symptoms or diagnosis of sleep apnea. he is not compliant with CPAP therapy. he does not have a history of rheumatic fever. he does not have a history of alcohol use. The patient does have a history of early familial atrial fibrillation or other arrhythmias. Father had afib.  he has a BMI of Body mass index is 30.05 kg/m.Marland Kitchen Filed Weights   03/29/19 1506  Weight: 109 kg    Family History   Problem Relation Age of Onset  . Atrial fibrillation Father      Atrial Fibrillation Management history:  Previous antiarrhythmic drugs: none Previous cardioversions: 02/25/19 Previous ablations: none CHADS2VASC score: 0 Anticoagulation history: Eliquis (stopped 2/2 allergic reaction), Xarelto   Past Medical History:  Diagnosis Date  . Anxiety   . Arthritis    oa  . Chest pain since 02-11-2013   pt thinks associated with reflux  . Dyspnea    with exertion  . GERD (gastroesophageal reflux disease)   . H/O hiatal hernia    Past Surgical History:  Procedure Laterality Date  . ANKLE SURGERY Right    reconstruction to muscle  . CARDIOVERSION N/A 02/25/2019   Procedure: CARDIOVERSION;  Surgeon: Donato Heinz, MD;  Location: [facility] ENDOSCOPY;  Service: Endoscopy;  Laterality: N/A;  . ESOPHAGEAL MANOMETRY N/A 04/17/2015   Procedure: ESOPHAGEAL MANOMETRY (EM);  Surgeon: Juanita Craver, MD;  Location: [facility] ENDOSCOPY;  Service: Endoscopy;  Laterality: N/A;  . ESOPHAGOGASTRODUODENOSCOPY  11/09/2003  . FOOT SURGERY     right  . HIP SURGERY  age 45   left, done x 2  . KNEE ARTHROSCOPY Left 10/20/2003 and 2012  . LEFT HEART CATH AND CORONARY ANGIOGRAPHY N/A 12/29/2018   Procedure: LEFT HEART CATH AND CORONARY ANGIOGRAPHY;  Surgeon: Alexius Sine, MD;  Location: [place] [facility] CV LAB;  Service: Cardiovascular;  Laterality: N/A;  . MYRINGOTOMY WITH TUBE PLACEMENT Right 08/29/2015   Procedure: MYRINGOTOMY WITH TUBE PLACEMENT;  Surgeon: Leta Baptist, MD;  Location: [place];  Service: ENT;  Laterality: Right;  . [place]  STUDY N/A 04/17/2015   Procedure: PH IMPEDANCE STUDY;  Surgeon: Charna Elizabeth, MD;  Location: [facility] ENDOSCOPY;  Service: Endoscopy;  Laterality: N/A;  . SEPTOPLASTY N/A 08/29/2015   Procedure: SEPTOPLASTY;  Surgeon: Newman Pies, MD;  Location: [facility];  Service: ENT;  Laterality: N/A;  . TOTAL HIP ARTHROPLASTY Left 02/23/2013   Procedure: LEFT TOTAL HIP  ARTHROPLASTY ANTERIOR APPROACH;  Surgeon: Shelda Pal, MD;  Location: [facility] ORS;  Service: Orthopedics;  Laterality: Left;  . TOTAL KNEE ARTHROPLASTY Right 11/11/2018   Procedure: TOTAL KNEE ARTHROPLASTY;  Surgeon: [name], MD;  Location: [facility] ORS;  Service: Orthopedics;  Laterality: Right;   . TURBINATE REDUCTION Bilateral 08/29/2015   Procedure: TURBINATE REDUCTION;  Surgeon: Newman Pies, MD;  Location:  SURGERY CENTER;  Service: ENT;  Laterality: Bilateral;  . URETHRA SURGERY   59yrs ago   for scar tissue    Current Outpatient Medications  Medication Sig Dispense Refill  . atenolol (TENORMIN) 25 MG tablet Take 1 tablet (25 mg total) by mouth daily. 90 tablet 2  . diphenhydrAMINE (BENADRYL ALLERGY) 25 mg capsule Take 1 capsule (25 mg total) by mouth every 6 (six) hours as needed. (Patient taking differently: Take 25 mg by mouth every 4 (four) hours as needed for allergies. ) 30 capsule 0  . gabapentin (NEURONTIN) 300 MG capsule Take 600 mg by mouth at bedtime.    . pantoprazole (PROTONIX) 40 MG tablet Take 1 tablet (40 mg total) by mouth daily. (Patient taking differently: Take 40 mg by mouth daily as needed (acid reflux). ) 30 tablet 1  . rivaroxaban (XARELTO) 20 MG TABS tablet Take 1 tablet (20 mg total) by mouth daily with supper. 90 tablet 2  . flecainide (TAMBOCOR) 50 MG tablet Take 1 tablet (50 mg total) by mouth 2 (two) times daily. 60 tablet 3   No current facility-administered medications for this encounter.     Allergies  Allergen Reactions  . Eliquis [Apixaban]     Rash    Social History   Socioeconomic History  . Marital status: Married    Spouse name: Not on file  . Number of children: Not on file  . Years of education: Not on file  . Highest education level: Not on file  Occupational History  . Not on file  Social Needs  . Financial resource strain: Not on file  . Food insecurity    Worry: Not on file    Inability: Not on file  .  Transportation needs    Medical: Not on file    Non-medical: Not on file  Tobacco Use  . Smoking status: Former Smoker    Packs/day: 1.50    Years: 25.00    Pack years: 37.50    Types: Cigarettes    Quit date: 05/29/2017    Years since quitting: 1.8  . Smokeless tobacco: Never Used  . Tobacco comment: quit 2018  Substance and Sexual Activity  . Alcohol use: Yes    Comment: ocassionally  . Drug use: Yes    Types: Marijuana    Comment: occ marijuana last used 1 week ago  . Sexual activity: Not on file  Lifestyle  . Physical activity    Days per week: Not on file    Minutes per session: Not on file  . Stress: Not on file  Relationships  . Social Musician on phone: Not on file    Gets together: Not on file  Attends religious service: Not on file    Active member of club or organization: Not on file    Attends meetings of clubs or organizations: Not on file    Relationship status: Not on file  . Intimate partner violence    Fear of current or ex partner: Not on file    Emotionally abused: Not on file    Physically abused: Not on file    Forced sexual activity: Not on file  Other Topics Concern  . Not on file  Social History Narrative  . Not on file     ROS- All systems are reviewed and negative except as per the HPI above.  Physical Exam: Vitals:   03/29/19 1506  BP: 116/88  Pulse: 90  Weight: 109 kg  Height: 6\' 3"  (1.905 m)    GEN- The patient is well appearing obese male, alert and oriented x 3 today.   Head- normocephalic, atraumatic Eyes-  Sclera clear, conjunctiva pink Ears- hearing intact Oropharynx- clear Neck- supple  Lungs- Clear to ausculation bilaterally, normal work of breathing Heart- irregular rate and rhythm, no murmurs, rubs or gallops  GI- soft, NT, ND, + BS Extremities- no clubbing, cyanosis, or edema MS- no significant deformity or atrophy Skin- no rash or lesion Psych- euthymic mood, full affect Neuro- strength and  sensation are intact  Wt Readings from Last 3 Encounters:  03/29/19 109 kg  03/25/19 109.3 kg  02/25/19 108.9 kg    EKG today demonstrates afib HR 90, QRS 84, QTc 389  Echo 12/31/18 demonstrated  1. The left ventricle has normal systolic function with an ejection fraction of 60-65%. The cavity size was normal. Left ventricular diastolic function could not be evaluated secondary to atrial fibrillation.  2. The right ventricle has normal systolic function. The cavity was normal.  3. The aortic valve is tricuspid. Mild thickening of the aortic valve. No stenosis of the aortic valve.  4. Pt in atrial fibrillation during the study; normal LV function; no significant valvular heart disease.  Epic records are reviewed at length today  Assessment and Plan:  1. Persistent atrial fibrillation S/p DCCV 02/25/19 with ERAF. ? Prednisone related. General education about afib provided and questions answered.  We also discussed his stroke risk and the risks and benefits of anticoagulation. After discussing the risks and benefits of AAD therapy, will start flecainide 50 mg BID. Will arrange TST after restoration of SR. LHC 11/2018 showed no CAD, EF normal on recent echo. If patient remains in persistent afib, will likely need repeat DCCV. Continue Xarelto 20 mg daily for now in case repeat DCCV is neccessary.  Continue atenolol 25 mg daily. Lifestyle changes as below. We also discussed possibility of afib ablation and patient agreeable to consider if he fails flecainide.  This patients CHA2DS2-VASc Score and unadjusted Ischemic Stroke Rate (% per year) is equal to 0.2 % stroke rate/year from a score of 0  Above score calculated as 1 point each if present [CHF, HTN, DM, Vascular=MI/PAD/Aortic Plaque, Age if 65-74, or Male] Above score calculated as 2 points each if present [Age > 75, or Stroke/TIA/TE]   2. Obesity Body mass index is 30.05 kg/m. Lifestyle modification was discussed at length  including regular exercise and weight reduction.   3. OSA The importance of adequate treatment of sleep apnea was discussed today in order to improve our ability to maintain sinus rhythm long term. Patient agrees to be more diligent using his CPAP.   Follow up in AF  clinic for ECG after flecainide start.   Jorja Loa PA-C [facility] [address] [place], [place] [address] [phone] 03/29/2019 3:59 PM

## 2019-03-30 DIAGNOSIS — H66011 Acute suppurative otitis media with spontaneous rupture of ear drum, right ear: Secondary | ICD-10-CM | POA: Diagnosis not present

## 2019-04-02 ENCOUNTER — Other Ambulatory Visit (HOSPITAL_COMMUNITY)
Admission: RE | Admit: 2019-04-02 | Discharge: 2019-04-02 | Disposition: A | Discharge status: Home / Self Care | Payer: BC Managed Care – PPO | Source: Ambulatory Visit | Attending: Internal Medicine | Admitting: Internal Medicine

## 2019-04-02 DIAGNOSIS — Z20828 Contact with and (suspected) exposure to other viral communicable diseases: Secondary | ICD-10-CM | POA: Diagnosis not present

## 2019-04-02 DIAGNOSIS — Z01812 Encounter for preprocedural laboratory examination: Secondary | ICD-10-CM | POA: Diagnosis not present

## 2019-04-02 DIAGNOSIS — Z96659 Presence of unspecified artificial knee joint: Secondary | ICD-10-CM | POA: Diagnosis not present

## 2019-04-04 LAB — NOVEL CORONAVIRUS, NAA (HOSP ORDER, SEND-OUT TO REF LAB; TAT 18-24 HRS): SARS-CoV-2, NAA: NOT DETECTED

## 2019-04-06 ENCOUNTER — Ambulatory Visit (INDEPENDENT_AMBULATORY_CARE_PROVIDER_SITE_OTHER): Payer: BC Managed Care – PPO | Admitting: Internal Medicine

## 2019-04-06 ENCOUNTER — Encounter: Payer: Self-pay | Admitting: Internal Medicine

## 2019-04-06 ENCOUNTER — Other Ambulatory Visit: Payer: Self-pay

## 2019-04-06 VITALS — BP 138/86 | HR 90 | Temp 97.6°F | Ht 76.0 in | Wt 238.0 lb

## 2019-04-06 DIAGNOSIS — R0600 Dyspnea, unspecified: Secondary | ICD-10-CM | POA: Diagnosis not present

## 2019-04-06 DIAGNOSIS — K219 Gastro-esophageal reflux disease without esophagitis: Secondary | ICD-10-CM

## 2019-04-06 DIAGNOSIS — R0609 Other forms of dyspnea: Secondary | ICD-10-CM

## 2019-04-06 DIAGNOSIS — G4733 Obstructive sleep apnea (adult) (pediatric): Secondary | ICD-10-CM

## 2019-04-06 LAB — PULMONARY FUNCTION TEST
DL/VA % pred: 91 %
DL/VA: 3.92 ml/min/mmHg/L
DLCO cor % pred: 88 %
DLCO cor: 30.79 ml/min/mmHg
DLCO unc % pred: 86 %
DLCO unc: 30.07 ml/min/mmHg
FEF 25-75 Post: 4.59 L/sec
FEF 25-75 Pre: 3 L/sec
FEF2575-%Change-Post: 52 %
FEF2575-%Pred-Post: 115 %
FEF2575-%Pred-Pre: 75 %
FEV1-%Change-Post: 10 %
FEV1-%Pred-Post: 89 %
FEV1-%Pred-Pre: 81 %
FEV1-Post: 4.19 L
FEV1-Pre: 3.78 L
FEV1FVC-%Change-Post: 4 %
FEV1FVC-%Pred-Pre: 97 %
FEV6-%Change-Post: 5 %
FEV6-%Pred-Post: 90 %
FEV6-%Pred-Pre: 85 %
FEV6-Post: 5.28 L
FEV6-Pre: 5.01 L
FEV6FVC-%Change-Post: 0 %
FEV6FVC-%Pred-Post: 103 %
FEV6FVC-%Pred-Pre: 103 %
FVC-%Change-Post: 5 %
FVC-%Pred-Post: 87 %
FVC-%Pred-Pre: 82 %
FVC-Post: 5.3 L
FVC-Pre: 5.01 L
Post FEV1/FVC ratio: 79 %
Post FEV6/FVC ratio: 100 %
Pre FEV1/FVC ratio: 75 %
Pre FEV6/FVC Ratio: 100 %
RV % pred: 115 %
RV: 2.79 L
TLC % pred: 98 %
TLC: 8.07 L

## 2019-04-06 NOTE — H&P (View-Only) (Signed)
HPI M former smoker followed for OSA, complicated by  A-fib, Overweight, depression, GERD, DDD w scoliosis, Allergic rhinitis . Son is ER MD for [employer] in [place]. HST 12/15/2018- AHI 20.2/ hr, desaturation to 86%, body weight 241 lbs PFT 04/06/2019  - WNL FVC 5.30/ 87%, FEV1 4.19/ 89%, R 0.79, no response, TLC 98%, DLCO 86%.  ----------------------------------------------------------------------------------  12/03/2018- 53 yo M former smoker referred by Patrecia Pace FNP for new onset A-fib & snoring. Son is ER MD for [employer] in [place]. Medical problem list includes Overweight, depression, GERD, DDD w scoliosis, Allergic rhinitis Body weight today 241 lbs Epworth score 8 Wakes up gasping, often with tachypalpitation, some substernal presssure, but no chest pain.  Smoked for 34 yearss, aware of DOE and asks evaluation for COPD. ENT-septoplasty Sleep fragmented by knee pain and nocturia. Hx of multiple orthopedic procedures.  Some difficulty swallowing with sense of food hangup- has had EGD   04/06/2019- 53 yo M former smoker followed for OSA, complicated by  A-fib, Overweight, depression, GERD, DDD w scoliosis, Allergic rhinitis . Son is ER MD for [employer] in [place]. f/u after PFT; pt also on CPAP, reports discomfort with pressure settings and mask ------- recently given full face mask by [employer] he has yet to try  HST 12/15/2018- AHI 20.2/ hr, desaturation to 86%, body weight 241 lbs CPAP auto 5-20/ [employer] ordered 7/7>> today 5-15 Not using CPAP "uncomfortable"- mostly mask fit issues and leak. No download. Body weight today 238 lbs Since last here had R TKR. Also found new P AFib, cardioverted unsuccessfully and now controlled on meds. Some DOE, occ minor cough esp if laboring. Quit smoking 2 years ago. Asks to defer Flu Vax.  Acid reflux with past recognized aspiration- almost none since he stopped smoking, but we discussed elevation of head of bed.  PFT 04/06/2019  - WNL FVC 5.30/ 87%,  FEV1 4.19/ 89%, R 0.79, no response, TLC 98%, DLCO 86%.  ROS-see HPI   + = positive Constitutional:    weight loss, night sweats, fevers, chills, fatigue, lassitude. HEENT:    headaches, +difficulty swallowing, tooth/dental problems, sore throat,       sneezing, itching, ear ache, nasal congestion, post nasal drip, snoring CV:    chest pain, orthopnea, PND, swelling in lower extremities, anasarca,                                  dizziness, palpitations Resp:   +shortness of breath with exertion or at rest.                productive cough,   non-productive cough, coughing up of blood.              change in color of mucus.  wheezing.   Skin:    rash or lesions. GI:     +  heartburn, indigestion, abdominal pain, nausea, vomiting, diarrhea,                 change in bowel habits, loss of appetite GU: dysuria, change in color of urine, no urgency or frequency.   flank pain. MS:   +joint pain, stiffness, decreased range of motion, back pain. Neuro-     nothing unusual Psych:  change in mood or affect.  +depression or anxiety.   memory loss.  OBJ- Physical Exam General- Alert, Oriented, Affect-appropriate, Distress- none acute Skin- + tatoos Lymphadenopathy- none Head- atraumatic  Eyes- Gross vision intact, PERRLA, conjunctivae and secretions clear            Ears- Hearing, canals-normal            Nose- Clear, no-Septal dev, mucus, polyps, erosion, perforation             Throat- Mallampati II , mucosa clear , drainage- none, tonsils- atrophic Neck- flexible , trachea midline, no stridor , thyroid nl, carotid no bruit Chest - symmetrical excursion , unlabored           Heart/CV- IRR/ Afib , no murmur , no gallop  , no rub, nl s1 s2                           - JVD- none , edema- none, stasis changes- none, varices- none           Lung- clear to P&A, wheeze- none, cough- none , dullness-none, rub- none           Chest wall-  Abd-  Br/ Gen/ Rectal- Not done, not  indicated Extrem- + recent incision scar R TKR Neuro- grossly intact to observation

## 2019-04-06 NOTE — Patient Instructions (Signed)
Order- DME [employer] please change CPAP to auto 5-15, mask of choice, humidifier, supplies, AirView/ cArd  Order- schedule CPAP mask fitting and desensitization at sleep center  Suggest you try raising the head of your bed frame with a brick under the head legs. This small tilt can help keep stomach acid down hill where it belongs.   Please call if we can help

## 2019-04-06 NOTE — Assessment & Plan Note (Signed)
Benefits from CPAP when he can wear it, but significant mask discomfort issues, and lower pressure may help. Plan- reduce auto range to 5-15, refer for CPAP desensitization and mask fitting.

## 2019-04-06 NOTE — Assessment & Plan Note (Signed)
Worse heartburn and recognized aspiration events in the past, mcu improved since he stopped smoking.  Plan- elevate head of bed as precaution.

## 2019-04-06 NOTE — Progress Notes (Signed)
HPI M former smoker followed for OSA, complicated by  A-fib, Overweight, depression, GERD, DDD w scoliosis, Allergic rhinitis . Son is ER MD for [employer] in [place]. HST 12/15/2018- AHI 20.2/ hr, desaturation to 86%, body weight 241 lbs PFT 04/06/2019  - WNL FVC 5.30/ 87%, FEV1 4.19/ 89%, R 0.79, no response, TLC 98%, DLCO 86%.  ----------------------------------------------------------------------------------  12/03/2018- 53 yo M former smoker referred by Patrecia Pace FNP for new onset A-fib & snoring. Son is ER MD for [employer] in [place]. Medical problem list includes Overweight, depression, GERD, DDD w scoliosis, Allergic rhinitis Body weight today 241 lbs Epworth score 8 Wakes up gasping, often with tachypalpitation, some substernal presssure, but no chest pain.  Smoked for 34 yearss, aware of DOE and asks evaluation for COPD. ENT-septoplasty Sleep fragmented by knee pain and nocturia. Hx of multiple orthopedic procedures.  Some difficulty swallowing with sense of food hangup- has had EGD   04/06/2019- 53 yo M former smoker followed for OSA, complicated by  A-fib, Overweight, depression, GERD, DDD w scoliosis, Allergic rhinitis . Son is ER MD for [employer] in [place]. f/u after PFT; pt also on CPAP, reports discomfort with pressure settings and mask ------- recently given full face mask by [employer] he has yet to try  HST 12/15/2018- AHI 20.2/ hr, desaturation to 86%, body weight 241 lbs CPAP auto 5-20/ [employer] ordered 7/7>> today 5-15 Not using CPAP "uncomfortable"- mostly mask fit issues and leak. No download. Body weight today 238 lbs Since last here had R TKR. Also found new P AFib, cardioverted unsuccessfully and now controlled on meds. Some DOE, occ minor cough esp if laboring. Quit smoking 2 years ago. Asks to defer Flu Vax.  Acid reflux with past recognized aspiration- almost none since he stopped smoking, but we discussed elevation of head of bed.  PFT 04/06/2019  - WNL FVC 5.30/ 87%,  FEV1 4.19/ 89%, R 0.79, no response, TLC 98%, DLCO 86%.  ROS-see HPI   + = positive Constitutional:    weight loss, night sweats, fevers, chills, fatigue, lassitude. HEENT:    headaches, +difficulty swallowing, tooth/dental problems, sore throat,       sneezing, itching, ear ache, nasal congestion, post nasal drip, snoring CV:    chest pain, orthopnea, PND, swelling in lower extremities, anasarca,                                  dizziness, palpitations Resp:   +shortness of breath with exertion or at rest.                productive cough,   non-productive cough, coughing up of blood.              change in color of mucus.  wheezing.   Skin:    rash or lesions. GI:     +  heartburn, indigestion, abdominal pain, nausea, vomiting, diarrhea,                 change in bowel habits, loss of appetite GU: dysuria, change in color of urine, no urgency or frequency.   flank pain. MS:   +joint pain, stiffness, decreased range of motion, back pain. Neuro-     nothing unusual Psych:  change in mood or affect.  +depression or anxiety.   memory loss.  OBJ- Physical Exam General- Alert, Oriented, Affect-appropriate, Distress- none acute Skin- + tatoos Lymphadenopathy- none Head- atraumatic  Eyes- Gross vision intact, PERRLA, conjunctivae and secretions clear            Ears- Hearing, canals-normal            Nose- Clear, no-Septal dev, mucus, polyps, erosion, perforation             Throat- Mallampati II , mucosa clear , drainage- none, tonsils- atrophic Neck- flexible , trachea midline, no stridor , thyroid nl, carotid no bruit Chest - symmetrical excursion , unlabored           Heart/CV- IRR/ Afib , no murmur , no gallop  , no rub, nl s1 s2                           - JVD- none , edema- none, stasis changes- none, varices- none           Lung- clear to P&A, wheeze- none, cough- none , dullness-none, rub- none           Chest wall-  Abd-  Br/ Gen/ Rectal- Not done, not  indicated Extrem- + recent incision scar R TKR Neuro- grossly intact to observation   

## 2019-04-06 NOTE — Progress Notes (Signed)
Full PFT performed today. °

## 2019-04-07 ENCOUNTER — Other Ambulatory Visit (HOSPITAL_COMMUNITY): Payer: Self-pay | Admitting: *Deleted

## 2019-04-07 ENCOUNTER — Ambulatory Visit (HOSPITAL_COMMUNITY)
Admission: RE | Admit: 2019-04-07 | Discharge: 2019-04-07 | Disposition: A | Discharge status: Home / Self Care | Payer: BC Managed Care – PPO | Source: Ambulatory Visit | Attending: Nurse Practitioner | Admitting: Nurse Practitioner

## 2019-04-07 VITALS — BP 120/78 | HR 93

## 2019-04-07 DIAGNOSIS — Z79899 Other long term (current) drug therapy: Secondary | ICD-10-CM | POA: Diagnosis not present

## 2019-04-07 DIAGNOSIS — I4819 Other persistent atrial fibrillation: Secondary | ICD-10-CM

## 2019-04-07 DIAGNOSIS — I4891 Unspecified atrial fibrillation: Secondary | ICD-10-CM | POA: Insufficient documentation

## 2019-04-07 LAB — CBC
HCT: 42.7 % (ref 39.0–52.0)
Hemoglobin: 14.5 g/dL (ref 13.0–17.0)
MCH: 32.2 pg (ref 26.0–34.0)
MCHC: 34 g/dL (ref 30.0–36.0)
MCV: 94.7 fL (ref 80.0–100.0)
Platelets: 246 10*3/uL (ref 150–400)
RBC: 4.51 MIL/uL (ref 4.22–5.81)
RDW: 13.2 % (ref 11.5–15.5)
WBC: 7 10*3/uL (ref 4.0–10.5)
nRBC: 0 % (ref 0.0–0.2)

## 2019-04-07 LAB — BASIC METABOLIC PANEL
Anion gap: 9 (ref 5–15)
BUN: 9 mg/dL (ref 6–20)
CO2: 23 mmol/L (ref 22–32)
Calcium: 9.3 mg/dL (ref 8.9–10.3)
Chloride: 108 mmol/L (ref 98–111)
Creatinine, Ser: 0.98 mg/dL (ref 0.61–1.24)
GFR calc Af Amer: >60 mL/min (ref >60)
GFR calc non Af Amer: >60 mL/min (ref >60)
Glucose, Bld: 128 mg/dL — ABNORMAL HIGH (ref 70–99)
Potassium: 4.4 mmol/L (ref 3.5–5.1)
Sodium: 140 mmol/L (ref 135–145)

## 2019-04-07 NOTE — Addendum Note (Signed)
Encounter addended by: Juluis Mire, RN on: 04/07/2019 9:52 AM  Actions taken: Order list changed, Clinical Note Signed

## 2019-04-07 NOTE — Progress Notes (Signed)
Patient returns for ECG after starting flecainide. He continues to have fatigue and SOB and remains in rate controlled afib today. ECG shows afib HR 93, QRS 84, QTc 417. Will arrange DCCV. Check Bmet/CBC today. F/u in AF clinic one week after DCCV. Patient agreeable to ablation evaluation if he should fail flecainide.

## 2019-04-07 NOTE — Patient Instructions (Signed)
Cardioversion scheduled for Monday, October 12th  - Arrive at the [facility] and go to admitting at 9:30AM  -Do not eat or drink anything after midnight the night prior to your procedure.  - Take all your morning medication with a sip of water prior to arrival.  - You will not be able to drive home after your procedure.

## 2019-04-08 ENCOUNTER — Other Ambulatory Visit (HOSPITAL_COMMUNITY)
Admission: RE | Admit: 2019-04-08 | Discharge: 2019-04-08 | Disposition: A | Discharge status: Home / Self Care | Payer: BC Managed Care – PPO | Source: Ambulatory Visit | Attending: Cardiovascular Disease | Admitting: Cardiovascular Disease

## 2019-04-08 DIAGNOSIS — Z01812 Encounter for preprocedural laboratory examination: Secondary | ICD-10-CM | POA: Insufficient documentation

## 2019-04-08 DIAGNOSIS — Z20828 Contact with and (suspected) exposure to other viral communicable diseases: Secondary | ICD-10-CM | POA: Insufficient documentation

## 2019-04-09 LAB — NOVEL CORONAVIRUS, NAA (HOSP ORDER, SEND-OUT TO REF LAB; TAT 18-24 HRS): SARS-CoV-2, NAA: NOT DETECTED

## 2019-04-09 NOTE — Progress Notes (Signed)
Pre op phone call complete for procedure on Monday 10/12 in ENDO. Patient states they have remained quarantined since COVID test and will continue to through the weekend. All questions addressed.  

## 2019-04-12 ENCOUNTER — Encounter (HOSPITAL_COMMUNITY)
Admission: RE | Disposition: A | Discharge status: Home / Self Care | Payer: Self-pay | Source: Home / Self Care | Attending: Cardiology

## 2019-04-12 ENCOUNTER — Ambulatory Visit (HOSPITAL_COMMUNITY): Payer: BC Managed Care – PPO | Admitting: Certified Registered Nurse Anesthetist

## 2019-04-12 ENCOUNTER — Other Ambulatory Visit: Payer: Self-pay

## 2019-04-12 ENCOUNTER — Ambulatory Visit (HOSPITAL_COMMUNITY)
Admission: RE | Admit: 2019-04-12 | Discharge: 2019-04-12 | Disposition: A | Discharge status: Home / Self Care | Payer: BC Managed Care – PPO | Attending: Cardiology | Admitting: Cardiology

## 2019-04-12 DIAGNOSIS — G4733 Obstructive sleep apnea (adult) (pediatric): Secondary | ICD-10-CM | POA: Diagnosis not present

## 2019-04-12 DIAGNOSIS — K219 Gastro-esophageal reflux disease without esophagitis: Secondary | ICD-10-CM | POA: Insufficient documentation

## 2019-04-12 DIAGNOSIS — M419 Scoliosis, unspecified: Secondary | ICD-10-CM | POA: Diagnosis not present

## 2019-04-12 DIAGNOSIS — R0609 Other forms of dyspnea: Secondary | ICD-10-CM | POA: Diagnosis not present

## 2019-04-12 DIAGNOSIS — M25569 Pain in unspecified knee: Secondary | ICD-10-CM | POA: Insufficient documentation

## 2019-04-12 DIAGNOSIS — R351 Nocturia: Secondary | ICD-10-CM | POA: Diagnosis not present

## 2019-04-12 DIAGNOSIS — E663 Overweight: Secondary | ICD-10-CM | POA: Insufficient documentation

## 2019-04-12 DIAGNOSIS — F329 Major depressive disorder, single episode, unspecified: Secondary | ICD-10-CM | POA: Insufficient documentation

## 2019-04-12 DIAGNOSIS — Z87891 Personal history of nicotine dependence: Secondary | ICD-10-CM | POA: Diagnosis not present

## 2019-04-12 DIAGNOSIS — I4891 Unspecified atrial fibrillation: Secondary | ICD-10-CM | POA: Diagnosis not present

## 2019-04-12 DIAGNOSIS — D500 Iron deficiency anemia secondary to blood loss (chronic): Secondary | ICD-10-CM | POA: Diagnosis not present

## 2019-04-12 DIAGNOSIS — I4819 Other persistent atrial fibrillation: Secondary | ICD-10-CM | POA: Diagnosis not present

## 2019-04-12 HISTORY — PX: CARDIOVERSION: SHX1299

## 2019-04-12 SURGERY — CARDIOVERSION
Anesthesia: General

## 2019-04-12 MED ORDER — LIDOCAINE 2% (20 MG/ML) 5 ML SYRINGE
INTRAMUSCULAR | Status: DC | PRN
  Administered 2019-04-12: 80 mg via INTRAVENOUS

## 2019-04-12 MED ORDER — PROPOFOL 10 MG/ML IV BOLUS
INTRAVENOUS | Status: DC | PRN
  Administered 2019-04-12: 100 mg via INTRAVENOUS

## 2019-04-12 MED ORDER — SODIUM CHLORIDE 0.9 % IV SOLN
INTRAVENOUS | Status: DC | PRN
  Administered 2019-04-12: 11:00:00 via INTRAVENOUS

## 2019-04-12 NOTE — Discharge Instructions (Signed)

## 2019-04-12 NOTE — Anesthesia Preprocedure Evaluation (Signed)
Anesthesia Evaluation  Patient identified by MRN, date of birth, ID band Patient awake    Reviewed: Allergy & Precautions, NPO status , Patient's Chart, lab work & pertinent test results  Airway Mallampati: II  TM Distance: >3 FB Neck ROM: Full    Dental no notable dental hx.    Pulmonary neg pulmonary ROS, former smoker,    Pulmonary exam normal breath sounds clear to auscultation       Cardiovascular + dysrhythmias Atrial Fibrillation  Rhythm:Irregular Rate:Normal     Neuro/Psych Anxiety negative neurological ROS     GI/Hepatic Neg liver ROS, GERD  ,  Endo/Other  negative endocrine ROS  Renal/GU negative Renal ROS  negative genitourinary   Musculoskeletal negative musculoskeletal ROS (+)   Abdominal   Peds negative pediatric ROS (+)  Hematology negative hematology ROS (+)   Anesthesia Other Findings   Reproductive/Obstetrics negative OB ROS                             Anesthesia Physical Anesthesia Plan  ASA: III  Anesthesia Plan: General   Post-op Pain Management:    Induction: Intravenous  PONV Risk Score and Plan: 0  Airway Management Planned: Mask  Additional Equipment:   Intra-op Plan:   Post-operative Plan:   Informed Consent: I have reviewed the patients History and Physical, chart, labs and discussed the procedure including the risks, benefits and alternatives for the proposed anesthesia with the patient or authorized representative who has indicated his/her understanding and acceptance.     Dental advisory given  Plan Discussed with: CRNA and Surgeon  Anesthesia Plan Comments:         Anesthesia Quick Evaluation

## 2019-04-12 NOTE — Anesthesia Procedure Notes (Signed)
Procedure Name: General with mask airway Date/Time: 04/12/2019 11:06 AM Performed by: Janene Harvey, CRNA Pre-anesthesia Checklist: Patient identified, Emergency Drugs available, Suction available and Patient being monitored Oxygen Delivery Method: Ambu bag Dental Injury: Teeth and Oropharynx as per pre-operative assessment

## 2019-04-12 NOTE — Interval H&P Note (Signed)
History and Physical Interval Note:  04/12/2019 9:34 AM  Ryan [name]  has presented today for surgery, with the diagnosis of atrial fibrillation.  The various methods of treatment have been discussed with the patient and family. After consideration of risks, benefits and other options for treatment, the patient has consented to  Procedure(s): CARDIOVERSION (N/A) as a surgical intervention.  The patient's history has been reviewed, patient examined, no change in status, stable for surgery.  I have reviewed the patient's chart and labs.  Questions were answered to the patient's satisfaction.     Ena Dawley

## 2019-04-12 NOTE — Transfer of Care (Signed)
Immediate Anesthesia Transfer of Care Note  Patient: Ryan [name]  Procedure(s) Performed: CARDIOVERSION (N/A )  Patient Location: Endoscopy Unit  Anesthesia Type:General  Level of Consciousness: drowsy  Airway & Oxygen Therapy: Patient Spontanous Breathing and Patient connected to nasal cannula oxygen  Post-op Assessment: Report given to RN and Post -op Vital signs reviewed and stable  Post vital signs: Reviewed  Last Vitals:  Vitals Value Taken Time  BP    Temp    Pulse    Resp    SpO2      Last Pain:  Vitals:   04/12/19 1026  TempSrc: Oral  PainSc: 0-No pain         Complications: No apparent anesthesia complications

## 2019-04-12 NOTE — Interval H&P Note (Signed)
History and Physical Interval Note:  04/12/2019 9:44 AM  Ryan [name]  has presented today for surgery, with the diagnosis of atrial fibrillation.  The various methods of treatment have been discussed with the patient and family. After consideration of risks, benefits and other options for treatment, the patient has consented to  Procedure(s): CARDIOVERSION (N/A) as a surgical intervention.  The patient's history has been reviewed, patient examined, no change in status, stable for surgery.  I have reviewed the patient's chart and labs.  Questions were answered to the patient's satisfaction.     Ena Dawley

## 2019-04-12 NOTE — Anesthesia Postprocedure Evaluation (Signed)
Anesthesia Post Note  Patient: Ryan [name]  Procedure(s) Performed: CARDIOVERSION (N/A )     Patient location during evaluation: PACU Anesthesia Type: General Level of consciousness: awake and alert Pain management: pain level controlled Vital Signs Assessment: post-procedure vital signs reviewed and stable Respiratory status: spontaneous breathing, nonlabored ventilation, respiratory function stable and patient connected to nasal cannula oxygen Cardiovascular status: blood pressure returned to baseline and stable Postop Assessment: no apparent nausea or vomiting Anesthetic complications: no    Last Vitals:  Vitals:   04/12/19 1120 04/12/19 1130  BP: 106/68 107/78  Pulse: (!) 58 (!) 55  Resp: 16 15  Temp:    SpO2: 99% 99%    Last Pain:  Vitals:   04/12/19 1130  TempSrc:   PainSc: 0-No pain                 Venisa Frampton S

## 2019-04-12 NOTE — CV Procedure (Signed)
    Cardioversion Note  ISSIAC JAMAR [id number] 1966-01-29  Procedure: DC Cardioversion Indications: atrial fibrillation  Procedure Details Consent: Obtained Time Out: Verified patient identification, verified procedure, site/side was marked, verified correct patient position, special equipment/implants available, Radiology Safety Procedures followed,  medications/allergies/relevent history reviewed, required imaging and test results available.  Performed  The patient has been on adequate anticoagulation.  The patient received IV propofol 100 mg and IV Lidocaine 80 mg for sedation.  Synchronous cardioversion was performed at 120 joules.  The cardioversion was successful.  Complications: No apparent complications Patient did tolerate procedure well.  Ena Dawley, MD, [facility] 04/12/2019, 11:15 AM

## 2019-04-13 ENCOUNTER — Encounter (HOSPITAL_COMMUNITY): Payer: Self-pay | Admitting: Cardiology

## 2019-04-13 DIAGNOSIS — H66011 Acute suppurative otitis media with spontaneous rupture of ear drum, right ear: Secondary | ICD-10-CM | POA: Diagnosis not present

## 2019-04-13 DIAGNOSIS — R0982 Postnasal drip: Secondary | ICD-10-CM | POA: Diagnosis not present

## 2019-04-13 DIAGNOSIS — J310 Chronic rhinitis: Secondary | ICD-10-CM | POA: Diagnosis not present

## 2019-04-14 DIAGNOSIS — G4733 Obstructive sleep apnea (adult) (pediatric): Secondary | ICD-10-CM | POA: Diagnosis not present

## 2019-04-16 ENCOUNTER — Other Ambulatory Visit (HOSPITAL_COMMUNITY)
Admission: RE | Admit: 2019-04-16 | Discharge: 2019-04-16 | Disposition: A | Discharge status: Home / Self Care | Payer: BC Managed Care – PPO | Source: Ambulatory Visit | Attending: Internal Medicine | Admitting: Internal Medicine

## 2019-04-16 DIAGNOSIS — Z20828 Contact with and (suspected) exposure to other viral communicable diseases: Secondary | ICD-10-CM | POA: Diagnosis not present

## 2019-04-16 DIAGNOSIS — Z01812 Encounter for preprocedural laboratory examination: Secondary | ICD-10-CM | POA: Diagnosis not present

## 2019-04-17 LAB — NOVEL CORONAVIRUS, NAA (HOSP ORDER, SEND-OUT TO REF LAB; TAT 18-24 HRS): SARS-CoV-2, NAA: NOT DETECTED

## 2019-04-19 ENCOUNTER — Ambulatory Visit (HOSPITAL_COMMUNITY): Payer: BC Managed Care – PPO | Admitting: Physician Assistant

## 2019-04-20 ENCOUNTER — Other Ambulatory Visit (HOSPITAL_BASED_OUTPATIENT_CLINIC_OR_DEPARTMENT_OTHER): Payer: BC Managed Care – PPO | Admitting: Internal Medicine

## 2019-04-22 ENCOUNTER — Ambulatory Visit (HOSPITAL_COMMUNITY)
Admission: RE | Admit: 2019-04-22 | Discharge: 2019-04-22 | Disposition: A | Discharge status: Home / Self Care | Payer: BC Managed Care – PPO | Source: Ambulatory Visit | Attending: Physician Assistant | Admitting: Physician Assistant

## 2019-04-22 ENCOUNTER — Encounter (HOSPITAL_COMMUNITY): Payer: Self-pay | Admitting: Physician Assistant

## 2019-04-22 ENCOUNTER — Other Ambulatory Visit: Payer: Self-pay

## 2019-04-22 VITALS — BP 128/80 | HR 49 | Ht 75.0 in | Wt 239.8 lb

## 2019-04-22 DIAGNOSIS — R001 Bradycardia, unspecified: Secondary | ICD-10-CM | POA: Insufficient documentation

## 2019-04-22 DIAGNOSIS — I4819 Other persistent atrial fibrillation: Secondary | ICD-10-CM | POA: Diagnosis not present

## 2019-04-22 DIAGNOSIS — Z79899 Other long term (current) drug therapy: Secondary | ICD-10-CM | POA: Diagnosis not present

## 2019-04-22 DIAGNOSIS — K219 Gastro-esophageal reflux disease without esophagitis: Secondary | ICD-10-CM | POA: Diagnosis not present

## 2019-04-22 DIAGNOSIS — G4733 Obstructive sleep apnea (adult) (pediatric): Secondary | ICD-10-CM | POA: Diagnosis not present

## 2019-04-22 DIAGNOSIS — Z87891 Personal history of nicotine dependence: Secondary | ICD-10-CM | POA: Insufficient documentation

## 2019-04-22 MED ORDER — RIVAROXABAN 20 MG PO TABS: 20.0000 mg | ORAL_TABLET | Freq: Every day | ORAL | 2 refills | Status: DC

## 2019-04-22 NOTE — Patient Instructions (Signed)
Stop Xarelto 05/13/2019

## 2019-04-22 NOTE — Progress Notes (Signed)
Primary Care Physician: [facility] Cardiologist: Dr Margaretann Loveless Primary Electrophysiologist: none Referring Physician: Kerin Ransom PA-C   Ryan [name] is a 53 y.o. male with a history of persistent atrial fibrillation and OSA who presents for consultation in the [facility].  The patient was initially diagnosed with atrial fibrillation 07/2018 at an ED visit but apparently went unnoticed until a preop appt for knee replacement in 10/2018. He saw Dr Margaretann Loveless 12/23/2018 and because of his history of chest pain was set up for OP cath. This was done 12/29/2018 and showed normal coronaries. Eliquis was added and the plan was for him to have OP DCCV. Echo 12/31/2018 showed normal LVF with an EF of 60-65% and normal LA size. He had an allergic reaction to Eliquis and was changed to Xarelto. He ultimatley underwent OP DCCV 02/25/2019. Unfortunately, patient had ERAF on follow up after DCCV. He states that immediately after his DCCV he felt better with more energy and less SOB. Of note, he had been on prednisone for his knee pain. He denies any significant alcohol use. He does have diagnosis of OSA and has used CPAP intermittently.   On follow up today, patient is s/p DCCV on 04/12/19. Patient reports that his symptoms of fatigue have improved. He is feeling well and able to work without issue. He brings in readings from his Kardia device which show SR. He got new equipment for his CPAP yesterday and plans to start using it tonight.   Today, he denies symptoms of palpitations, chest pain, orthopnea, PND, lower extremity edema, dizziness, presyncope, syncope,bleeding, or neurologic sequela. The patient is tolerating medications without difficulties and is otherwise without complaint today.   Atrial Fibrillation Risk Factors:  he does have symptoms or diagnosis of sleep apnea. he is not compliant with CPAP therapy. he does not have a history of  rheumatic fever. he does not have a history of alcohol use. The patient does have a history of early familial atrial fibrillation or other arrhythmias. Father had afib.  he has a BMI of Body mass index is 29.97 kg/m.Marland Kitchen Filed Weights   04/22/19 0941  Weight: 108.8 kg    Family History  Problem Relation Age of Onset  . Atrial fibrillation Father      Atrial Fibrillation Management history:  Previous antiarrhythmic drugs: flecainide Previous cardioversions: 02/25/19, 04/12/19 Previous ablations: none CHADS2VASC score: 0 Anticoagulation history: Eliquis (stopped 2/2 allergic reaction), Xarelto   Past Medical History:  Diagnosis Date  . Anxiety   . Arthritis    oa  . Chest pain since 02-11-2013   pt thinks associated with reflux  . Dyspnea    with exertion  . GERD (gastroesophageal reflux disease)   . H/O hiatal hernia    Past Surgical History:  Procedure Laterality Date  . ANKLE SURGERY Right    reconstruction to muscle  . CARDIOVERSION N/A 02/25/2019   Procedure: CARDIOVERSION;  Surgeon: Donato Heinz, MD;  Location: [facility] ENDOSCOPY;  Service: Endoscopy;  Laterality: N/A;  . CARDIOVERSION N/A 04/12/2019   Procedure: CARDIOVERSION;  Surgeon: Dorothy Spark, MD;  Location: [facility] ENDOSCOPY;  Service: Cardiovascular;  Laterality: N/A;  . ESOPHAGEAL MANOMETRY N/A 04/17/2015   Procedure: ESOPHAGEAL MANOMETRY (EM);  Surgeon: Juanita Craver, MD;  Location: [facility] ENDOSCOPY;  Service: Endoscopy;  Laterality: N/A;  . ESOPHAGOGASTRODUODENOSCOPY  11/09/2003  . FOOT SURGERY     right  . HIP SURGERY  age 71   left, done x  2  . KNEE ARTHROSCOPY Left 10/20/2003 and 2012  . LEFT HEART CATH AND CORONARY ANGIOGRAPHY N/A 12/29/2018   Procedure: LEFT HEART CATH AND CORONARY ANGIOGRAPHY;  Surgeon: Lennette Bihari, MD;  Location: [facility] INVASIVE CV LAB;  Service: Cardiovascular;  Laterality: N/A;  . MYRINGOTOMY WITH TUBE PLACEMENT Right 08/29/2015   Procedure: MYRINGOTOMY WITH TUBE PLACEMENT;   Surgeon: Newman Pies, MD;  Location: [facility];  Service: ENT;  Laterality: Right;  . PH IMPEDANCE STUDY N/A 04/17/2015   Procedure: PH IMPEDANCE STUDY;  Surgeon: Charna Elizabeth, MD;  Location: [facility] ENDOSCOPY;  Service: Endoscopy;  Laterality: N/A;  . SEPTOPLASTY N/A 08/29/2015   Procedure: SEPTOPLASTY;  Surgeon: Newman Pies, MD;  Location: [facility];  Service: ENT;  Laterality: N/A;  . TOTAL HIP ARTHROPLASTY Left 02/23/2013   Procedure: LEFT TOTAL HIP ARTHROPLASTY ANTERIOR APPROACH;  Surgeon: Shelda Pal, MD;  Location: [facility] ORS;  Service: Orthopedics;  Laterality: Left;  . TOTAL KNEE ARTHROPLASTY Right 11/11/2018   Procedure: TOTAL KNEE ARTHROPLASTY;  Surgeon: [name], MD;  Location: [facility]S;  Service: Orthopedics;  Laterality: Right;   . TURBINATE REDUCTION Bilateral 08/29/2015   Procedure: TURBINATE REDUCTION;  Surgeon: Newman Pies, MD;  Location: [facility];  Service: ENT;  Laterality: Bilateral;  . URETHRA SURGERY   35yrs ago   for scar tissue    Current Outpatient Medications  Medication Sig Dispense Refill  . atenolol (TENORMIN) 25 MG tablet Take 1 tablet (25 mg total) by mouth daily. 90 tablet 2  . flecainide (TAMBOCOR) 50 MG tablet Take 1 tablet (50 mg total) by mouth 2 (two) times daily. 60 tablet 3  . gabapentin (NEURONTIN) 300 MG capsule Take 600 mg by mouth at bedtime.    . pantoprazole (PROTONIX) 20 MG tablet Take 20 mg by mouth daily as needed for heartburn.    . rivaroxaban (XARELTO) 20 MG TABS tablet Take 1 tablet (20 mg total) by mouth daily with supper for 21 days. 90 tablet 2  . ciprofloxacin (CILOXAN) 0.3 % ophthalmic solution PLACE 4 DROPS INTO RIGHT EAR BID     No current facility-administered medications for this encounter.     Allergies  Allergen Reactions  . Eliquis [Apixaban]     Rash    Social History   Socioeconomic History  . Marital status: Married    Spouse name: Not on file  . Number of children: Not on  file  . Years of education: Not on file  . Highest education level: Not on file  Occupational History  . Not on file  Social Needs  . Financial resource strain: Not on file  . Food insecurity    Worry: Not on file    Inability: Not on file  . Transportation needs    Medical: Not on file    Non-medical: Not on file  Tobacco Use  . Smoking status: Former Smoker    Packs/day: 1.50    Years: 25.00    Pack years: 37.50    Types: Cigarettes    Quit date: 05/29/2017    Years since quitting: 1.8  . Smokeless tobacco: Never Used  . Tobacco comment: quit 2018  Substance and Sexual Activity  . Alcohol use: Yes    Comment: ocassionally  . Drug use: Yes    Types: Marijuana    Comment: occ marijuana last used 1 week ago  . Sexual activity: Not on file  Lifestyle  . Physical activity  Days per week: Not on file    Minutes per session: Not on file  . Stress: Not on file  Relationships  . Social Musicianconnections    Talks on phone: Not on file    Gets together: Not on file    Attends religious service: Not on file    Active member of club or organization: Not on file    Attends meetings of clubs or organizations: Not on file    Relationship status: Not on file  . Intimate partner violence    Fear of current or ex partner: Not on file    Emotionally abused: Not on file    Physically abused: Not on file    Forced sexual activity: Not on file  Other Topics Concern  . Not on file  Social History Narrative  . Not on file     ROS- All systems are reviewed and negative except as per the HPI above.  Physical Exam: Vitals:   04/22/19 0941  BP: 128/80  Pulse: (!) 49  SpO2: 96%  Weight: 108.8 kg  Height: 6\' 3"  (1.905 m)    GEN- The patient is well appearing, alert and oriented x 3 today.   HEENT-head normocephalic, atraumatic, sclera clear, conjunctiva pink, hearing intact, trachea midline. Lungs- Clear to ausculation bilaterally, normal work of breathing Heart- Regular rate and  rhythm, no murmurs, rubs or gallops  GI- soft, NT, ND, + BS Extremities- no clubbing, cyanosis, or edema MS- no significant deformity or atrophy Skin- no rash or lesion Psych- euthymic mood, full affect Neuro- strength and sensation are intact   Wt Readings from Last 3 Encounters:  04/22/19 108.8 kg  04/12/19 108 kg  04/06/19 108 kg    EKG today demonstrates SB HR 49, PR 194, QRS 86, QTc 379  Echo 12/31/18 demonstrated  1. The left ventricle has normal systolic function with an ejection fraction of 60-65%. The cavity size was normal. Left ventricular diastolic function could not be evaluated secondary to atrial fibrillation.  2. The right ventricle has normal systolic function. The cavity was normal.  3. The aortic valve is tricuspid. Mild thickening of the aortic valve. No stenosis of the aortic valve.  4. Pt in atrial fibrillation during the study; normal LV function; no significant valvular heart disease.  Epic records are reviewed at length today  Assessment and Plan:  1. Persistent atrial fibrillation S/p DCCV 02/25/19 with ERAF. ? Prednisone related. S/p repeat DCCV on flecainide 04/12/19. Patient appears to be maintaining SR. Continue Xarelto 20 mg daily for at least one month post DCCV. Stop 05/13/19. Continue flecainide 50 mg BID. Continue atenolol 25 mg daily. Patient agreeable to afib ablation if he should have recurrent afib on flecainide. Lifestyle changes as below.  This patients CHA2DS2-VASc Score and unadjusted Ischemic Stroke Rate (% per year) is equal to 0.2 % stroke rate/year from a score of 0  Above score calculated as 1 point each if present [CHF, HTN, DM, Vascular=MI/PAD/Aortic Plaque, Age if 65-74, or Male] Above score calculated as 2 points each if present [Age > 75, or Stroke/TIA/TE]   2. Obesity Body mass index is 29.97 kg/m. Lifestyle modification was discussed and encouraged including regular physical activity and weight reduction.  3. OSA  The importance of adequate treatment of sleep apnea was discussed today in order to improve our ability to maintain sinus rhythm long term. Patient now has the equipment he needs for his CPAP. Plans to start using tonight.   4. Bradycardia HR  49 today. Asymptomatic. Kardia strips show HR in the upper 50s-60s. No change today.   Follow up in the AF clinic in 3 months.    Jorja Loa PA-C Afib Clinic [facility] [address] [place], [place] [address] [phone] 04/22/2019 10:10 AM

## 2019-04-27 DIAGNOSIS — Z96651 Presence of right artificial knee joint: Secondary | ICD-10-CM | POA: Diagnosis not present

## 2019-04-27 DIAGNOSIS — Z4789 Encounter for other orthopedic aftercare: Secondary | ICD-10-CM | POA: Diagnosis not present

## 2019-04-27 DIAGNOSIS — M25562 Pain in left knee: Secondary | ICD-10-CM | POA: Diagnosis not present

## 2019-05-03 DIAGNOSIS — M1712 Unilateral primary osteoarthritis, left knee: Secondary | ICD-10-CM | POA: Diagnosis not present

## 2019-05-07 DIAGNOSIS — H6123 Impacted cerumen, bilateral: Secondary | ICD-10-CM | POA: Diagnosis not present

## 2019-05-07 DIAGNOSIS — J310 Chronic rhinitis: Secondary | ICD-10-CM | POA: Diagnosis not present

## 2019-05-07 DIAGNOSIS — H906 Mixed conductive and sensorineural hearing loss, bilateral: Secondary | ICD-10-CM | POA: Diagnosis not present

## 2019-05-07 DIAGNOSIS — R0982 Postnasal drip: Secondary | ICD-10-CM | POA: Diagnosis not present

## 2019-05-07 DIAGNOSIS — H7201 Central perforation of tympanic membrane, right ear: Secondary | ICD-10-CM | POA: Diagnosis not present

## 2019-05-13 DIAGNOSIS — M1712 Unilateral primary osteoarthritis, left knee: Secondary | ICD-10-CM | POA: Diagnosis not present

## 2019-05-14 ENCOUNTER — Encounter: Payer: Self-pay | Admitting: Internal Medicine

## 2019-05-15 DIAGNOSIS — G4733 Obstructive sleep apnea (adult) (pediatric): Secondary | ICD-10-CM | POA: Diagnosis not present

## 2019-05-20 DIAGNOSIS — M1712 Unilateral primary osteoarthritis, left knee: Secondary | ICD-10-CM | POA: Diagnosis not present

## 2019-07-26 ENCOUNTER — Ambulatory Visit (HOSPITAL_COMMUNITY)
Admission: RE | Admit: 2019-07-26 | Discharge: 2019-07-26 | Disposition: A | Discharge status: Home / Self Care | Payer: BC Managed Care – PPO | Source: Ambulatory Visit | Attending: Physician Assistant | Admitting: Physician Assistant

## 2019-07-26 ENCOUNTER — Other Ambulatory Visit: Payer: Self-pay

## 2019-07-26 VITALS — BP 120/76 | HR 51 | Ht 75.0 in | Wt 231.0 lb

## 2019-07-26 DIAGNOSIS — R001 Bradycardia, unspecified: Secondary | ICD-10-CM | POA: Diagnosis not present

## 2019-07-26 DIAGNOSIS — Z87891 Personal history of nicotine dependence: Secondary | ICD-10-CM | POA: Diagnosis not present

## 2019-07-26 DIAGNOSIS — Z96651 Presence of right artificial knee joint: Secondary | ICD-10-CM | POA: Diagnosis not present

## 2019-07-26 DIAGNOSIS — Z79899 Other long term (current) drug therapy: Secondary | ICD-10-CM | POA: Insufficient documentation

## 2019-07-26 DIAGNOSIS — Z96642 Presence of left artificial hip joint: Secondary | ICD-10-CM | POA: Diagnosis not present

## 2019-07-26 DIAGNOSIS — Z888 Allergy status to other drugs, medicaments and biological substances status: Secondary | ICD-10-CM | POA: Insufficient documentation

## 2019-07-26 DIAGNOSIS — Z8249 Family history of ischemic heart disease and other diseases of the circulatory system: Secondary | ICD-10-CM | POA: Diagnosis not present

## 2019-07-26 DIAGNOSIS — I4819 Other persistent atrial fibrillation: Secondary | ICD-10-CM | POA: Diagnosis not present

## 2019-07-26 DIAGNOSIS — G4733 Obstructive sleep apnea (adult) (pediatric): Secondary | ICD-10-CM | POA: Insufficient documentation

## 2019-07-26 NOTE — Progress Notes (Signed)
Primary Care Physician: [facility] Cardiologist: Dr Jacques Navy Primary Electrophysiologist: none Referring Physician: Corine Shelter PA-C   Ryan [name] is a 54 y.o. male with a history of persistent atrial fibrillation and OSA who presents for consultation in the [facility].  The patient was initially diagnosed with atrial fibrillation 07/2018 at an ED visit but apparently went unnoticed until a preop appt for knee replacement in 10/2018. He saw Dr Jacques Navy 12/23/2018 and because of his history of chest pain was set up for OP cath. This was done 12/29/2018 and showed normal coronaries. Eliquis was added and the plan was for him to have OP DCCV. Echo 12/31/2018 showed normal LVF with an EF of 60-65% and normal LA size. He had an allergic reaction to Eliquis and was changed to Xarelto. He ultimatley underwent OP DCCV 02/25/2019. Unfortunately, patient had ERAF on follow up after DCCV. He states that immediately after his DCCV he felt better with more energy and less SOB. Of note, he had been on prednisone for his knee pain. He denies any significant alcohol use. He does have diagnosis of OSA and has used CPAP intermittently. Patient is s/p DCCV on 04/12/19.   On follow up today, patient reports that he has done well since his last visit. He has not had any heart racing or palpitations. He has not had any afib noted on his Kardia device.  Today, he denies symptoms of palpitations, chest pain, orthopnea, PND, lower extremity edema, dizziness, presyncope, syncope,bleeding, or neurologic sequela. The patient is tolerating medications without difficulties and is otherwise without complaint today.   Atrial Fibrillation Risk Factors:  he does have symptoms or diagnosis of sleep apnea. he is not compliant with CPAP therapy. he does not have a history of rheumatic fever. he does not have a history of alcohol use. The patient does have a  history of early familial atrial fibrillation or other arrhythmias. Father had afib.  he has a BMI of Body mass index is 28.87 kg/m.Marland Kitchen Filed Weights   07/26/19 1040  Weight: 104.8 kg    Family History  Problem Relation Age of Onset  . Atrial fibrillation Father      Atrial Fibrillation Management history:  Previous antiarrhythmic drugs: flecainide Previous cardioversions: 02/25/19, 04/12/19 Previous ablations: none CHADS2VASC score: 0 Anticoagulation history: Eliquis (stopped 2/2 allergic reaction), Xarelto   Past Medical History:  Diagnosis Date  . Anxiety   . Arthritis    oa  . Chest pain since 02-11-2013   pt thinks associated with reflux  . Dyspnea    with exertion  . GERD (gastroesophageal reflux disease)   . H/O hiatal hernia    Past Surgical History:  Procedure Laterality Date  . ANKLE SURGERY Right    reconstruction to muscle  . CARDIOVERSION N/A 02/25/2019   Procedure: CARDIOVERSION;  Surgeon: Little Ishikawa, MD;  Location: [facility] ENDOSCOPY;  Service: Endoscopy;  Laterality: N/A;  . CARDIOVERSION N/A 04/12/2019   Procedure: CARDIOVERSION;  Surgeon: Lars Masson, MD;  Location: [facility] ENDOSCOPY;  Service: Cardiovascular;  Laterality: N/A;  . ESOPHAGEAL MANOMETRY N/A 04/17/2015   Procedure: ESOPHAGEAL MANOMETRY (EM);  Surgeon: Charna Elizabeth, MD;  Location: [facility] ENDOSCOPY;  Service: Endoscopy;  Laterality: N/A;  . ESOPHAGOGASTRODUODENOSCOPY  11/09/2003  . FOOT SURGERY     right  . HIP SURGERY  age 45   left, done x 2  . KNEE ARTHROSCOPY Left 10/20/2003 and 2012  . LEFT HEART CATH AND  CORONARY ANGIOGRAPHY N/A 12/29/2018   Procedure: LEFT HEART CATH AND CORONARY ANGIOGRAPHY;  Surgeon: Leven Sine, MD;  Location: [place] CV LAB;  Service: Cardiovascular;  Laterality: N/A;  . MYRINGOTOMY WITH TUBE PLACEMENT Right 08/29/2015   Procedure: MYRINGOTOMY WITH TUBE PLACEMENT;  Surgeon: Leta Baptist, MD;  Location: [place];  Service: ENT;  Laterality:  Right;  . [place] IMPEDANCE STUDY N/A 04/17/2015   Procedure: [place];  Surgeon: Juanita Craver, MD;  Location: [facility] ENDOSCOPY;  Service: Endoscopy;  Laterality: N/A;  . SEPTOPLASTY N/A 08/29/2015   Procedure: SEPTOPLASTY;  Surgeon: Leta Baptist, MD;  Location: [place];  Service: ENT;  Laterality: N/A;  . TOTAL HIP ARTHROPLASTY Left 02/23/2013   Procedure: LEFT TOTAL HIP ARTHROPLASTY ANTERIOR APPROACH;  Surgeon: Mauri Pole, MD;  Location: [facility]S;  Service: Orthopedics;  Laterality: Left;  . TOTAL KNEE ARTHROPLASTY Right 11/11/2018   Procedure: TOTAL KNEE ARTHROPLASTY;  Surgeon: Latanya Maudlin, MD;  Location: [facility]S;  Service: Orthopedics;  Laterality: Right;  185min  . TURBINATE REDUCTION Bilateral 08/29/2015   Procedure: TURBINATE REDUCTION;  Surgeon: Leta Baptist, MD;  Location: [place];  Service: ENT;  Laterality: Bilateral;  . URETHRA SURGERY   69yrs ago   for scar tissue    Current Outpatient Medications  Medication Sig Dispense Refill  . atenolol (TENORMIN) 25 MG tablet Take 1 tablet (25 mg total) by mouth daily. 90 tablet 2  . flecainide (TAMBOCOR) 50 MG tablet Take 1 tablet (50 mg total) by mouth 2 (two) times daily. 60 tablet 3  . gabapentin (NEURONTIN) 300 MG capsule Take 600 mg by mouth at bedtime.     No current facility-administered medications for this encounter.    Allergies  Allergen Reactions  . Eliquis [Apixaban]     Rash    Social History   Socioeconomic History  . Marital status: Married    Spouse name: Not on file  . Number of children: Not on file  . Years of education: Not on file  . Highest education level: Not on file  Occupational History  . Not on file  Tobacco Use  . Smoking status: Former Smoker    Packs/day: 1.50    Years: 25.00    Pack years: 37.50    Types: Cigarettes    Quit date: 05/29/2017    Years since quitting: 2.1  . Smokeless tobacco: Never Used  . Tobacco comment: quit 2018  Substance and Sexual  Activity  . Alcohol use: Yes    Comment: ocassionally  . Drug use: Yes    Types: Marijuana    Comment: occ marijuana last used 1 week ago  . Sexual activity: Not on file  Other Topics Concern  . Not on file  Social History Narrative  . Not on file   Social Determinants of Health   Financial Resource Strain:   . Difficulty of Paying Living Expenses: Not on file  Food Insecurity:   . Worried About Charity fundraiser in the Last Year: Not on file  . Ran Out of Food in the Last Year: Not on file  Transportation Needs:   . Lack of Transportation (Medical): Not on file  . Lack of Transportation (Non-Medical): Not on file  Physical Activity:   . Days of Exercise per Week: Not on file  . Minutes of Exercise per Session: Not on file  Stress:   . Feeling of Stress : Not on file  Social Connections:   .  Frequency of Communication with Friends and Family: Not on file  . Frequency of Social Gatherings with Friends and Family: Not on file  . Attends Religious Services: Not on file  . Active Member of Clubs or Organizations: Not on file  . Attends Banker Meetings: Not on file  . Marital Status: Not on file  Intimate Partner Violence:   . Fear of Current or Ex-Partner: Not on file  . Emotionally Abused: Not on file  . Physically Abused: Not on file  . Sexually Abused: Not on file     ROS- All systems are reviewed and negative except as per the HPI above.  Physical Exam: Vitals:   07/26/19 1040  BP: 120/76  Pulse: (!) 51  Weight: 104.8 kg  Height: 6\' 3"  (1.905 m)    GEN- The patient is well appearing, alert and oriented x 3 today.   HEENT-head normocephalic, atraumatic, sclera clear, conjunctiva pink, hearing intact, trachea midline. Lungs- Clear to ausculation bilaterally, normal work of breathing Heart- Regular rate and rhythm, no murmurs, rubs or gallops  GI- soft, NT, ND, + BS Extremities- no clubbing, cyanosis, or edema MS- no significant deformity or  atrophy Skin- no rash or lesion Psych- euthymic mood, full affect Neuro- strength and sensation are intact   Wt Readings from Last 3 Encounters:  07/26/19 104.8 kg  04/22/19 108.8 kg  04/12/19 108 kg    EKG today demonstrates SB HR 51, PR 184, QRS 88, QTc 363  Echo 12/31/18 demonstrated  1. The left ventricle has normal systolic function with an ejection fraction of 60-65%. The cavity size was normal. Left ventricular diastolic function could not be evaluated secondary to atrial fibrillation.  2. The right ventricle has normal systolic function. The cavity was normal.  3. The aortic valve is tricuspid. Mild thickening of the aortic valve. No stenosis of the aortic valve.  4. Pt in atrial fibrillation during the study; normal LV function; no significant valvular heart disease.  Epic records are reviewed at length today  Assessment and Plan:  1. Persistent atrial fibrillation S/p DCCV 02/25/19 with ERAF. ? Prednisone related. S/p repeat DCCV on flecainide 04/12/19. Patient appears to be maintaining SR. Continue flecainide 50 mg BID. Continue atenolol 25 mg daily. Patient agreeable to afib ablation if he should have recurrent afib on flecainide. Anticoagulation not indicated at this time. Lifestyle changes as below.  This patients CHA2DS2-VASc Score and unadjusted Ischemic Stroke Rate (% per year) is equal to 0.2 % stroke rate/year from a score of 0  Above score calculated as 1 point each if present [CHF, HTN, DM, Vascular=MI/PAD/Aortic Plaque, Age if 65-74, or Male] Above score calculated as 2 points each if present [Age > 75, or Stroke/TIA/TE]   2. OSA The importance of adequate treatment of sleep apnea was discussed today in order to improve our ability to maintain sinus rhythm long term. Encouraged compliance with CPAP therapy.  3. Bradycardia Asymptomatic. Pulse rate 50s-60s at home. No change.   Follow up in the AF clinic in 6 months.   06/12/19 PA-C Afib  Clinic [facility] [address] [place], [place] [phone] 07/26/2019 11:05 AM

## 2019-08-02 ENCOUNTER — Other Ambulatory Visit (HOSPITAL_COMMUNITY): Payer: Self-pay | Admitting: Physician Assistant

## 2019-08-09 ENCOUNTER — Other Ambulatory Visit: Payer: Self-pay

## 2019-08-09 ENCOUNTER — Encounter: Payer: Self-pay | Admitting: Internal Medicine

## 2019-08-09 ENCOUNTER — Ambulatory Visit (INDEPENDENT_AMBULATORY_CARE_PROVIDER_SITE_OTHER): Payer: BC Managed Care – PPO | Admitting: Internal Medicine

## 2019-08-09 DIAGNOSIS — G4733 Obstructive sleep apnea (adult) (pediatric): Secondary | ICD-10-CM

## 2019-08-09 NOTE — Patient Instructions (Signed)
VISIT WAS NOT COMPLETED AND WILL BE RESCHEDULED

## 2019-08-09 NOTE — Progress Notes (Signed)
HPI M former smoker followed for OSA, complicated by  A-fib, Overweight, depression, GERD, DDD w scoliosis, Allergic rhinitis . Son is ER MD for [employer] in [place]. HST 12/15/2018- AHI 20.2/ hr, desaturation to 86%, body weight 241 lbs PFT 04/06/2019  - WNL FVC 5.30/ 87%, FEV1 4.19/ 89%, R 0.79, no response, TLC 98%, DLCO 86%.  ----------------------------------------------------------------------------------  04/06/2019- 54 yo M former smoker followed for OSA, complicated by  A-fib, Overweight, depression, GERD, DDD w scoliosis, Allergic rhinitis . Son is ER MD for [employer] in [place]. f/u after PFT; pt also on CPAP, reports discomfort with pressure settings and mask ------- recently given full face mask by [employer] he has yet to try  HST 12/15/2018- AHI 20.2/ hr, desaturation to 86%, body weight 241 lbs CPAP auto 5-20/ [employer] ordered 7/7>> today 5-15 Not using CPAP "uncomfortable"- mostly mask fit issues and leak. No download. Body weight today 238 lbs Since last here had R TKR. Also found new P AFib, cardioverted unsuccessfully and now controlled on meds. Some DOE, occ minor cough esp if laboring. Quit smoking 2 years ago. Asks to defer Flu Vax.  Acid reflux with past recognized aspiration- almost none since he stopped smoking, but we discussed elevation of head of bed.  PFT 04/06/2019  - WNL FVC 5.30/ 87%, FEV1 4.19/ 89%, R 0.79, no response, TLC 98%, DLCO 86%.  08/09/19- Virtual Visit via Telephone Note        INCOMPLETE VISIT- NO SHOW                                                                             PATIENT SPOKE WITH NURSE WHEN SHE CALLED A HALF HOR BEFORE SCHEDULED VISIT TIME, BUT DID NOT ANSWER WHEN WE CALLED BACK AS ARRANGED FOR ME TO TALK WITH HIM. THE VISIT WILL BE RESCHEDULED.  History of Present Illness: 54 yo M former smoker followed for OSA, complicated by  A-fib, Overweight, depression, GERD, DDD w scoliosis, Allergic rhinitis . Son is ER MD for [employer] in [place]. CPAP  auto 5-15/ [employer] Download compliance 0-  Not Using  "Made him feel smothered" per nurse call.   Observations/Objective:   Assessment and Plan:   Follow Up Instructions:.  I provided 0 minutes of non-face-to-face time during this encounter.   Baird Lyons, MD    ------------------------------  ROS-see HPI   + = positive Constitutional:    weight loss, night sweats, fevers, chills, fatigue, lassitude. HEENT:    headaches, +difficulty swallowing, tooth/dental problems, sore throat,       sneezing, itching, ear ache, nasal congestion, post nasal drip, snoring CV:    chest pain, orthopnea, PND, swelling in lower extremities, anasarca,                                  dizziness, palpitations Resp:   +shortness of breath with exertion or at rest.                productive cough,   non-productive cough, coughing up of blood.              change in color of mucus.  wheezing.  Skin:    rash or lesions. GI:     +  heartburn, indigestion, abdominal pain, nausea, vomiting, diarrhea,                 change in bowel habits, loss of appetite GU: dysuria, change in color of urine, no urgency or frequency.   flank pain. MS:   +joint pain, stiffness, decreased range of motion, back pain. Neuro-     nothing unusual Psych:  change in mood or affect.  +depression or anxiety.   memory loss.  OBJ- Physical Exam General- Alert, Oriented, Affect-appropriate, Distress- none acute Skin- + tatoos Lymphadenopathy- none Head- atraumatic            Eyes- Gross vision intact, PERRLA, conjunctivae and secretions clear            Ears- Hearing, canals-normal            Nose- Clear, no-Septal dev, mucus, polyps, erosion, perforation             Throat- Mallampati II , mucosa clear , drainage- none, tonsils- atrophic Neck- flexible , trachea midline, no stridor , thyroid nl, carotid no bruit Chest - symmetrical excursion , unlabored           Heart/CV- IRR/ Afib , no murmur , no gallop  , no rub, nl  s1 s2                           - JVD- none , edema- none, stasis changes- none, varices- none           Lung- clear to P&A, wheeze- none, cough- none , dullness-none, rub- none           Chest wall-  Abd-  Br/ Gen/ Rectal- Not done, not indicated Extrem- + recent incision scar R TKR Neuro- grossly intact to observation

## 2019-10-28 DIAGNOSIS — G8929 Other chronic pain: Secondary | ICD-10-CM | POA: Insufficient documentation

## 2019-12-02 ENCOUNTER — Other Ambulatory Visit: Payer: Self-pay

## 2019-12-02 ENCOUNTER — Encounter (HOSPITAL_COMMUNITY): Payer: Self-pay | Admitting: Physician Assistant

## 2019-12-02 ENCOUNTER — Ambulatory Visit (HOSPITAL_COMMUNITY)
Admission: RE | Admit: 2019-12-02 | Discharge: 2019-12-02 | Disposition: A | Discharge status: Home / Self Care | Payer: BC Managed Care – PPO | Source: Ambulatory Visit | Attending: Physician Assistant | Admitting: Physician Assistant

## 2019-12-02 ENCOUNTER — Telehealth (HOSPITAL_COMMUNITY): Payer: Self-pay

## 2019-12-02 VITALS — BP 126/80 | HR 88 | Ht 75.0 in | Wt 223.6 lb

## 2019-12-02 DIAGNOSIS — Z8249 Family history of ischemic heart disease and other diseases of the circulatory system: Secondary | ICD-10-CM | POA: Diagnosis not present

## 2019-12-02 DIAGNOSIS — F419 Anxiety disorder, unspecified: Secondary | ICD-10-CM | POA: Insufficient documentation

## 2019-12-02 DIAGNOSIS — G4733 Obstructive sleep apnea (adult) (pediatric): Secondary | ICD-10-CM | POA: Insufficient documentation

## 2019-12-02 DIAGNOSIS — Z7952 Long term (current) use of systemic steroids: Secondary | ICD-10-CM | POA: Diagnosis not present

## 2019-12-02 DIAGNOSIS — I4819 Other persistent atrial fibrillation: Secondary | ICD-10-CM | POA: Insufficient documentation

## 2019-12-02 DIAGNOSIS — K219 Gastro-esophageal reflux disease without esophagitis: Secondary | ICD-10-CM | POA: Insufficient documentation

## 2019-12-02 DIAGNOSIS — Z87891 Personal history of nicotine dependence: Secondary | ICD-10-CM | POA: Insufficient documentation

## 2019-12-02 DIAGNOSIS — Z7901 Long term (current) use of anticoagulants: Secondary | ICD-10-CM | POA: Diagnosis not present

## 2019-12-02 DIAGNOSIS — Z79899 Other long term (current) drug therapy: Secondary | ICD-10-CM | POA: Insufficient documentation

## 2019-12-02 DIAGNOSIS — Z9119 Patient's noncompliance with other medical treatment and regimen: Secondary | ICD-10-CM | POA: Diagnosis not present

## 2019-12-02 DIAGNOSIS — M1990 Unspecified osteoarthritis, unspecified site: Secondary | ICD-10-CM | POA: Insufficient documentation

## 2019-12-02 MED ORDER — RIVAROXABAN 20 MG PO TABS: 20.0000 mg | ORAL_TABLET | Freq: Every day | ORAL | 11 refills | Status: DC

## 2019-12-02 NOTE — Progress Notes (Signed)
Primary Care Physician: [facility] Primary Cardiologist: Dr Jacques Navy Primary Electrophysiologist: none Referring Physician: Corine Shelter PA-C   Ryan [name] is a 54 y.o. male with a history of persistent atrial fibrillation and OSA who presents for consultation in the [facility].  The patient was initially diagnosed with atrial fibrillation 07/2018 at an ED visit but apparently went unnoticed until a preop appt for knee replacement in 10/2018. He saw Dr Jacques Navy 12/23/2018 and because of his history of chest pain was set up for OP cath. This was done 12/29/2018 and showed normal coronaries. Eliquis was added and the plan was for him to have OP DCCV. Echo 12/31/2018 showed normal LVF with an EF of 60-65% and normal LA size. He had an allergic reaction to Eliquis and was changed to Xarelto. He ultimatley underwent OP DCCV 02/25/2019. Unfortunately, patient had ERAF on follow up after DCCV. He states that immediately after his DCCV he felt better with more energy and less SOB. Of note, he had been on prednisone for his knee pain. He denies any significant alcohol use. He does have diagnosis of OSA and has used CPAP intermittently. Patient is s/p DCCV on 04/12/19.   On follow up today, patient reports that he missed several doses of his flecainide starting the weekend of 11/27/19. He noted he was back in afib this AM with symptoms of fatigue. He resumed his flecainide this AM. He is not currently on anticoagulation. He is not compliant with his CPAP.  Today, he denies symptoms of palpitations, chest pain, orthopnea, PND, lower extremity edema, dizziness, presyncope, syncope,bleeding, or neurologic sequela. The patient is tolerating medications without difficulties and is otherwise without complaint today.   Atrial Fibrillation Risk Factors:  he does have symptoms or diagnosis of sleep apnea. he is not compliant with CPAP therapy. he does not  have a history of rheumatic fever. he does not have a history of alcohol use. The patient does have a history of early familial atrial fibrillation or other arrhythmias. Father had afib.  he has a BMI of Body mass index is 27.95 kg/m.Marland Kitchen Filed Weights   12/02/19 1505  Weight: 101.4 kg    Family History  Problem Relation Age of Onset  . Atrial fibrillation Father      Atrial Fibrillation Management history:  Previous antiarrhythmic drugs: flecainide Previous cardioversions: 02/25/19, 04/12/19 Previous ablations: none CHADS2VASC score: 0 Anticoagulation history: Eliquis (stopped 2/2 allergic reaction), Xarelto   Past Medical History:  Diagnosis Date  . Anxiety   . Arthritis    oa  . Chest pain since 02-11-2013   pt thinks associated with reflux  . Dyspnea    with exertion  . GERD (gastroesophageal reflux disease)   . H/O hiatal hernia    Past Surgical History:  Procedure Laterality Date  . ANKLE SURGERY Right    reconstruction to muscle  . CARDIOVERSION N/A 02/25/2019   Procedure: CARDIOVERSION;  Surgeon: Little Ishikawa, MD;  Location: [facility] ENDOSCOPY;  Service: Endoscopy;  Laterality: N/A;  . CARDIOVERSION N/A 04/12/2019   Procedure: CARDIOVERSION;  Surgeon: Lars Masson, MD;  Location: [facility] ENDOSCOPY;  Service: Cardiovascular;  Laterality: N/A;  . ESOPHAGEAL MANOMETRY N/A 04/17/2015   Procedure: ESOPHAGEAL MANOMETRY (EM);  Surgeon: Charna Elizabeth, MD;  Location: [facility] ENDOSCOPY;  Service: Endoscopy;  Laterality: N/A;  . ESOPHAGOGASTRODUODENOSCOPY  11/09/2003  . FOOT SURGERY     right  . HIP SURGERY  age 42   left, done  x 2  . KNEE ARTHROSCOPY Left 10/20/2003 and 2012  . LEFT HEART CATH AND CORONARY ANGIOGRAPHY N/A 12/29/2018   Procedure: LEFT HEART CATH AND CORONARY ANGIOGRAPHY;  Surgeon: Lennette Bihari, MD;  Location: [facility] INVASIVE CV LAB;  Service: Cardiovascular;  Laterality: N/A;  . MYRINGOTOMY WITH TUBE PLACEMENT Right 08/29/2015   Procedure: MYRINGOTOMY WITH  TUBE PLACEMENT;  Surgeon: Newman Pies, MD;  Location: [facility];  Service: ENT;  Laterality: Right;  . PH IMPEDANCE STUDY N/A 04/17/2015   Procedure: PH IMPEDANCE STUDY;  Surgeon: Charna Elizabeth, MD;  Location: [facility] ENDOSCOPY;  Service: Endoscopy;  Laterality: N/A;  . SEPTOPLASTY N/A 08/29/2015   Procedure: SEPTOPLASTY;  Surgeon: Newman Pies, MD;  Location: [facility];  Service: ENT;  Laterality: N/A;  . TOTAL HIP ARTHROPLASTY Left 02/23/2013   Procedure: LEFT TOTAL HIP ARTHROPLASTY ANTERIOR APPROACH;  Surgeon: Shelda Pal, MD;  Location: [facility] ORS;  Service: Orthopedics;  Laterality: Left;  . TOTAL KNEE ARTHROPLASTY Right 11/11/2018   Procedure: TOTAL KNEE ARTHROPLASTY;  Surgeon: [name], MD;  Location: [facility] ORS;  Service: Orthopedics;  Laterality: Right;   . TURBINATE REDUCTION Bilateral 08/29/2015   Procedure: TURBINATE REDUCTION;  Surgeon: Newman Pies, MD;  Location: [facility];  Service: ENT;  Laterality: Bilateral;  . URETHRA SURGERY   63yrs ago   for scar tissue    Current Outpatient Medications  Medication Sig Dispense Refill  . atenolol (TENORMIN) 25 MG tablet Take 1 tablet (25 mg total) by mouth daily. 90 tablet 2  . ciclopirox (LOPROX) 0.77 % cream APPLY TOPICALLY TO RASH TWICE DAILY FOR 4 WEEKS    . flecainide (TAMBOCOR) 50 MG tablet TAKE 1 TABLET(50 MG) BY MOUTH TWICE DAILY 60 tablet 6  . gabapentin (NEURONTIN) 300 MG capsule Take 600 mg by mouth at bedtime.    . rivaroxaban (XARELTO) 20 MG TABS tablet Take 1 tablet (20 mg total) by mouth daily with supper. 30 tablet 11   No current facility-administered medications for this encounter.    Allergies  Allergen Reactions  . Eliquis [Apixaban] Other (See Comments)    Rash    Social History   Socioeconomic History  . Marital status: Married    Spouse name: Not on file  . Number of children: Not on file  . Years of education: Not on file  . Highest education level: Not on file    Occupational History  . Not on file  Tobacco Use  . Smoking status: Former Smoker    Packs/day: 1.50    Years: 25.00    Pack years: 37.50    Types: Cigarettes    Quit date: 05/29/2017    Years since quitting: 2.5  . Smokeless tobacco: Never Used  . Tobacco comment: quit 2018  Substance and Sexual Activity  . Alcohol use: Yes    Alcohol/week: 2.0 standard drinks    Types: 2 Standard drinks or equivalent per week    Comment: ocassionally  . Drug use: Not Currently    Types: Marijuana    Comment: occ marijuana last used 1 week ago  . Sexual activity: Not on file  Other Topics Concern  . Not on file  Social History Narrative  . Not on file   Social Determinants of Health   Financial Resource Strain:   . Difficulty of Paying Living Expenses:   Food Insecurity:   . Worried About Programme researcher, broadcasting/film/video in the Last Year:   . [employer]  of Food in the Last Year:   Transportation Needs:   . Film/video editor (Medical):   Marland Kitchen Lack of Transportation (Non-Medical):   Physical Activity:   . Days of Exercise per Week:   . Minutes of Exercise per Session:   Stress:   . Feeling of Stress :   Social Connections:   . Frequency of Communication with Friends and Family:   . Frequency of Social Gatherings with Friends and Family:   . Attends Religious Services:   . Active Member of Clubs or Organizations:   . Attends Archivist Meetings:   Marland Kitchen Marital Status:   Intimate Partner Violence:   . Fear of Current or Ex-Partner:   . Emotionally Abused:   Marland Kitchen Physically Abused:   . Sexually Abused:      ROS- All systems are reviewed and negative except as per the HPI above.  Physical Exam: Vitals:   12/02/19 1505  BP: 126/80  Pulse: 88  Weight: 101.4 kg  Height: 6\' 3"  (1.905 m)    GEN- The patient is well appearing, alert and oriented x 3 today.   HEENT-head normocephalic, atraumatic, sclera clear, conjunctiva pink, hearing intact, trachea midline. Lungs- Clear to  ausculation bilaterally, normal work of breathing Heart- irregular rate and rhythm, no murmurs, rubs or gallops  GI- soft, NT, ND, + BS Extremities- no clubbing, cyanosis, or edema MS- no significant deformity or atrophy Skin- no rash or lesion Psych- euthymic mood, full affect Neuro- strength and sensation are intact   Wt Readings from Last 3 Encounters:  12/02/19 101.4 kg  07/26/19 104.8 kg  04/22/19 108.8 kg    EKG today demonstrates afib HR 88, QRS 86, QTc 408  Echo 12/31/18 demonstrated  1. The left ventricle has normal systolic function with an ejection fraction of 60-65%. The cavity size was normal. Left ventricular diastolic function could not be evaluated secondary to atrial fibrillation.  2. The right ventricle has normal systolic function. The cavity was normal.  3. The aortic valve is tricuspid. Mild thickening of the aortic valve. No stenosis of the aortic valve.  4. Pt in atrial fibrillation during the study; normal LV function; no significant valvular heart disease.  Epic records are reviewed at length today  Assessment and Plan:  1. Persistent atrial fibrillation Patient back in afib today, likely related to missing medications. Will restart Xarelto 20 mg daily in case DCCV is needed. Continue flecainide 50 mg BID Continue atenolol 25 mg daily Kardia for home monitoring  Will consider DCCV if he is persistent and after 3 weeks of anticoagulation.   This patients CHA2DS2-VASc Score and unadjusted Ischemic Stroke Rate (% per year) is equal to 0.2 % stroke rate/year from a score of 0  Above score calculated as 1 point each if present [CHF, HTN, DM, Vascular=MI/PAD/Aortic Plaque, Age if 65-74, or Male] Above score calculated as 2 points each if present [Age > 75, or Stroke/TIA/TE]  2. OSA Patient is not compliant with CPAP therapy.   Follow up in the AF clinic in 2 weeks.    [facility] [address] [place], [place] [address] [phone] 12/02/2019 4:36 PM

## 2019-12-02 NOTE — Patient Instructions (Signed)
Start taking Xarleto 20mg  daily

## 2019-12-02 NOTE — Telephone Encounter (Signed)
Patient left vm believing he is back in Afib. He states that he is having SOB and rapid HR.

## 2019-12-16 ENCOUNTER — Encounter (HOSPITAL_COMMUNITY): Payer: Self-pay

## 2019-12-16 ENCOUNTER — Ambulatory Visit (HOSPITAL_COMMUNITY): Payer: BC Managed Care – PPO | Admitting: Physician Assistant

## 2019-12-20 ENCOUNTER — Other Ambulatory Visit: Payer: Self-pay

## 2019-12-20 ENCOUNTER — Ambulatory Visit (HOSPITAL_COMMUNITY)
Admission: RE | Admit: 2019-12-20 | Discharge: 2019-12-20 | Disposition: A | Discharge status: Home / Self Care | Payer: BC Managed Care – PPO | Source: Ambulatory Visit | Attending: Physician Assistant | Admitting: Physician Assistant

## 2019-12-20 VITALS — BP 136/80 | HR 55 | Ht 75.0 in | Wt 228.8 lb

## 2019-12-20 DIAGNOSIS — M1990 Unspecified osteoarthritis, unspecified site: Secondary | ICD-10-CM | POA: Diagnosis not present

## 2019-12-20 DIAGNOSIS — Z7901 Long term (current) use of anticoagulants: Secondary | ICD-10-CM | POA: Diagnosis not present

## 2019-12-20 DIAGNOSIS — I4819 Other persistent atrial fibrillation: Secondary | ICD-10-CM | POA: Insufficient documentation

## 2019-12-20 DIAGNOSIS — G4733 Obstructive sleep apnea (adult) (pediatric): Secondary | ICD-10-CM | POA: Diagnosis not present

## 2019-12-20 DIAGNOSIS — F419 Anxiety disorder, unspecified: Secondary | ICD-10-CM | POA: Insufficient documentation

## 2019-12-20 DIAGNOSIS — Z96651 Presence of right artificial knee joint: Secondary | ICD-10-CM | POA: Diagnosis not present

## 2019-12-20 DIAGNOSIS — K219 Gastro-esophageal reflux disease without esophagitis: Secondary | ICD-10-CM | POA: Diagnosis not present

## 2019-12-20 DIAGNOSIS — Z9119 Patient's noncompliance with other medical treatment and regimen: Secondary | ICD-10-CM | POA: Diagnosis not present

## 2019-12-20 DIAGNOSIS — Z87891 Personal history of nicotine dependence: Secondary | ICD-10-CM | POA: Insufficient documentation

## 2019-12-20 DIAGNOSIS — Z96642 Presence of left artificial hip joint: Secondary | ICD-10-CM | POA: Diagnosis not present

## 2019-12-20 DIAGNOSIS — Z8719 Personal history of other diseases of the digestive system: Secondary | ICD-10-CM | POA: Diagnosis not present

## 2019-12-20 DIAGNOSIS — Z79899 Other long term (current) drug therapy: Secondary | ICD-10-CM | POA: Insufficient documentation

## 2019-12-20 DIAGNOSIS — Z888 Allergy status to other drugs, medicaments and biological substances status: Secondary | ICD-10-CM | POA: Diagnosis not present

## 2019-12-20 MED ORDER — RIVAROXABAN 20 MG PO TABS: 20.0000 mg | ORAL_TABLET | Freq: Every day | ORAL | 0 refills | Status: DC

## 2019-12-20 NOTE — Progress Notes (Signed)
Primary Care Physician: [facility] Primary Cardiologist: Dr Jacques Navy Primary Electrophysiologist: none Referring Physician: Corine Shelter PA-C   Ryan [name] is a 54 y.o. male with a history of persistent atrial fibrillation and OSA who presents for follow up in the [facility].  The patient was initially diagnosed with atrial fibrillation 07/2018 at an ED visit but apparently went unnoticed until a preop appt for knee replacement in 10/2018. He saw Dr Jacques Navy 12/23/2018 and because of his history of chest pain was set up for OP cath. This was done 12/29/2018 and showed normal coronaries. Eliquis was added and the plan was for him to have OP DCCV. Echo 12/31/2018 showed normal LVF with an EF of 60-65% and normal LA size. He had an allergic reaction to Eliquis and was changed to Xarelto. He ultimatley underwent OP DCCV 02/25/2019. Unfortunately, patient had ERAF on follow up after DCCV. He states that immediately after his DCCV he felt better with more energy and less SOB. Of note, he had been on prednisone for his knee pain. He denies any significant alcohol use. He does have diagnosis of OSA and has used CPAP intermittently. Patient is s/p DCCV on 04/12/19. Patient reports that he missed several doses of his flecainide starting the weekend of 11/27/19 was back in afib with symptoms of fatigue. He is not compliant with his CPAP.  On follow up today, patient continued to have symptoms of fatigue with his rate controlled afib. His Kardia showed persistent afib until 12/13/19. He is back in SR today. He denies any bleeding issues on anticoagulation.   Today, he denies symptoms of palpitations, chest pain, orthopnea, PND, lower extremity edema, dizziness, presyncope, syncope,bleeding, or neurologic sequela. The patient is tolerating medications without difficulties and is otherwise without complaint today.   Atrial Fibrillation Risk Factors:  he  does have symptoms or diagnosis of sleep apnea. he is not compliant with CPAP therapy. he does not have a history of rheumatic fever. he does not have a history of alcohol use. The patient does have a history of early familial atrial fibrillation or other arrhythmias. Father had afib.  he has a BMI of Body mass index is 28.6 kg/m.Marland Kitchen Filed Weights   12/20/19 0858  Weight: 103.8 kg    Family History  Problem Relation Age of Onset   Atrial fibrillation Father      Atrial Fibrillation Management history:  Previous antiarrhythmic drugs: flecainide Previous cardioversions: 02/25/19, 04/12/19 Previous ablations: none CHADS2VASC score: 0 Anticoagulation history: Eliquis (stopped 2/2 allergic reaction), Xarelto   Past Medical History:  Diagnosis Date   Anxiety    Arthritis    oa   Chest pain since 02-11-2013   pt thinks associated with reflux   Dyspnea    with exertion   GERD (gastroesophageal reflux disease)    H/O hiatal hernia    Past Surgical History:  Procedure Laterality Date   ANKLE SURGERY Right    reconstruction to muscle   CARDIOVERSION N/A 02/25/2019   Procedure: CARDIOVERSION;  Surgeon: Little Ishikawa, MD;  Location: [facility] ENDOSCOPY;  Service: Endoscopy;  Laterality: N/A;   CARDIOVERSION N/A 04/12/2019   Procedure: CARDIOVERSION;  Surgeon: Lars Masson, MD;  Location: [facility] ENDOSCOPY;  Service: Cardiovascular;  Laterality: N/A;   ESOPHAGEAL MANOMETRY N/A 04/17/2015   Procedure: ESOPHAGEAL MANOMETRY (EM);  Surgeon: Charna Elizabeth, MD;  Location: [facility] ENDOSCOPY;  Service: Endoscopy;  Laterality: N/A;   ESOPHAGOGASTRODUODENOSCOPY  11/09/2003   FOOT SURGERY  right   HIP SURGERY  age 87   left, done x 2   KNEE ARTHROSCOPY Left 10/20/2003 and 2012   LEFT HEART CATH AND CORONARY ANGIOGRAPHY N/A 12/29/2018   Procedure: LEFT HEART CATH AND CORONARY ANGIOGRAPHY;  Surgeon: Lennette Bihari, MD;  Location: [facility] INVASIVE CV LAB;  Service: Cardiovascular;   Laterality: N/A;   MYRINGOTOMY WITH TUBE PLACEMENT Right 08/29/2015   Procedure: MYRINGOTOMY WITH TUBE PLACEMENT;  Surgeon: Newman Pies, MD;  Location: [facility];  Service: ENT;  Laterality: Right;   PH IMPEDANCE STUDY N/A 04/17/2015   Procedure: PH IMPEDANCE STUDY;  Surgeon: Charna Elizabeth, MD;  Location: [facility] ENDOSCOPY;  Service: Endoscopy;  Laterality: N/A;   SEPTOPLASTY N/A 08/29/2015   Procedure: SEPTOPLASTY;  Surgeon: Newman Pies, MD;  Location: [facility];  Service: ENT;  Laterality: N/A;   TOTAL HIP ARTHROPLASTY Left 02/23/2013   Procedure: LEFT TOTAL HIP ARTHROPLASTY ANTERIOR APPROACH;  Surgeon: Shelda Pal, MD;  Location: [facility];  Service: Orthopedics;  Laterality: Left;   TOTAL KNEE ARTHROPLASTY Right 11/11/2018   Procedure: TOTAL KNEE ARTHROPLASTY;  Surgeon: [name], MD;  Location: [facility];  Service: Orthopedics;  Laterality: Right;    TURBINATE REDUCTION Bilateral 08/29/2015   Procedure: TURBINATE REDUCTION;  Surgeon: Newman Pies, MD;  Location: [facility];  Service: ENT;  Laterality: Bilateral;   URETHRA SURGERY   74yrs ago   for scar tissue    Current Outpatient Medications  Medication Sig Dispense Refill   atenolol (TENORMIN) 25 MG tablet Take 1 tablet (25 mg total) by mouth daily. 90 tablet 2   ciclopirox (LOPROX) 0.77 % cream APPLY TOPICALLY TO RASH TWICE DAILY FOR 4 WEEKS     flecainide (TAMBOCOR) 50 MG tablet TAKE 1 TABLET(50 MG) BY MOUTH TWICE DAILY 60 tablet 6   gabapentin (NEURONTIN) 300 MG capsule Take 600 mg by mouth at bedtime.     rivaroxaban (XARELTO) 20 MG TABS tablet Take 1 tablet (20 mg total) by mouth daily with supper. 30 tablet 0   No current facility-administered medications for this encounter.    Allergies  Allergen Reactions   Eliquis [Apixaban] Other (See Comments)    Rash    Social History   Socioeconomic History   Marital status: Married    Spouse name: Not on file   Number of  children: Not on file   Years of education: Not on file   Highest education level: Not on file  Occupational History   Not on file  Tobacco Use   Smoking status: Former Smoker    Packs/day: 1.50    Years: 25.00    Pack years: 37.50    Types: Cigarettes    Quit date: 05/29/2017    Years since quitting: 2.5   Smokeless tobacco: Never Used   Tobacco comment: quit 2018  Vaping Use   Vaping Use: Never used  Substance and Sexual Activity   Alcohol use: Yes    Alcohol/week: 2.0 standard drinks    Types: 2 Standard drinks or equivalent per week    Comment: ocassionally   Drug use: Not Currently    Types: Marijuana    Comment: occ marijuana last used 1 week ago   Sexual activity: Not on file  Other Topics Concern   Not on file  Social History Narrative   Not on file   Social Determinants of Health   Financial Resource Strain:    Difficulty of Paying Living Expenses:  Food Insecurity:    Worried About Charity fundraiser in the Last Year:    Arboriculturist in the Last Year:   Transportation Needs:    Film/video editor (Medical):    Lack of Transportation (Non-Medical):   Physical Activity:    Days of Exercise per Week:    Minutes of Exercise per Session:   Stress:    Feeling of Stress :   Social Connections:    Frequency of Communication with Friends and Family:    Frequency of Social Gatherings with Friends and Family:    Attends Religious Services:    Active Member of Clubs or Organizations:    Attends Music therapist:    Marital Status:   Intimate Partner Violence:    Fear of Current or Ex-Partner:    Emotionally Abused:    Physically Abused:    Sexually Abused:      ROS- All systems are reviewed and negative except as per the HPI above.  Physical Exam: Vitals:   12/20/19 0858  BP: 136/80  Pulse: (!) 55  Weight: 103.8 kg  Height: 6\' 3"  (1.905 m)    GEN- The patient is well appearing, alert and oriented  x 3 today.   HEENT-head normocephalic, atraumatic, sclera clear, conjunctiva pink, hearing intact, trachea midline. Lungs- Clear to ausculation bilaterally, normal work of breathing Heart- Regular rate and rhythm, no murmurs, rubs or gallops  GI- soft, NT, ND, + BS Extremities- no clubbing, cyanosis, or edema MS- no significant deformity or atrophy Skin- no rash or lesion Psych- euthymic mood, full affect Neuro- strength and sensation are intact   Wt Readings from Last 3 Encounters:  12/20/19 103.8 kg  12/02/19 101.4 kg  07/26/19 104.8 kg    EKG today demonstrates SB HR 55, PR 206, QRS 88, QTc 369  Echo 12/31/18 demonstrated  1. The left ventricle has normal systolic function with an ejection fraction of 60-65%. The cavity size was normal. Left ventricular diastolic function could not be evaluated secondary to atrial fibrillation.  2. The right ventricle has normal systolic function. The cavity was normal.  3. The aortic valve is tricuspid. Mild thickening of the aortic valve. No stenosis of the aortic valve.  4. Pt in atrial fibrillation during the study; normal LV function; no significant valvular heart disease.  Epic records are reviewed at length today  Assessment and Plan:  1. Persistent atrial fibrillation Patient back in SR. Could consider increasing flecainide if needed. Encouraged compliance with medication.  Continue Xarelto 20 mg daily for one month (7/21) and then may discontinue.  Continue flecainide 50 mg BID Continue atenolol 25 mg daily Kardia for home monitoring   This patients CHA2DS2-VASc Score and unadjusted Ischemic Stroke Rate (% per year) is equal to 0.2 % stroke rate/year from a score of 0  Above score calculated as 1 point each if present [CHF, HTN, DM, Vascular=MI/PAD/Aortic Plaque, Age if 65-74, or Male] Above score calculated as 2 points each if present [Age > 75, or Stroke/TIA/TE]  2. OSA Patient is not compliant with CPAP therapy.   Follow  up in the AF clinic in 3 months.    [facility] [address] [place], [place] [address] [phone] 12/20/2019 9:51 AM

## 2019-12-20 NOTE — Patient Instructions (Signed)
Discontinue Xarleto on 01/19/2020

## 2020-01-04 ENCOUNTER — Other Ambulatory Visit: Payer: Self-pay | Admitting: Cardiology

## 2020-01-06 ENCOUNTER — Telehealth: Payer: Self-pay | Admitting: *Deleted

## 2020-01-06 NOTE — Telephone Encounter (Signed)
Spoke to patient -  Schedule follow up appointment  With Dr Jacques Navy on Sept 28 at 9 am.  Patient is aware  And verbalized understanding. Patient  Wanted to know if there was an issue. RN informed patient Dr Jacques Navy did not indicate an issue . She just wanted to follow up with visit from the afib clinic and discuss treatment.   Patient voiced understanding.

## 2020-01-06 NOTE — Telephone Encounter (Signed)
Parke Poisson, MD  Tobin Chad, RN; Orvis Brill Can we arrange follow up for this patient, has not been seen by our office since last September - can put him on my schedule right after his next afib clinic appt in September.  Thanks,  GA

## 2020-01-24 ENCOUNTER — Ambulatory Visit (HOSPITAL_COMMUNITY): Payer: BC Managed Care – PPO | Admitting: Physician Assistant

## 2020-02-07 ENCOUNTER — Telehealth (HOSPITAL_COMMUNITY): Payer: Self-pay

## 2020-02-07 ENCOUNTER — Other Ambulatory Visit (HOSPITAL_COMMUNITY): Payer: Self-pay

## 2020-02-07 MED ORDER — ATENOLOL 25 MG PO TABS: 25.0000 mg | ORAL_TABLET | Freq: Two times a day (BID) | ORAL | Status: DC

## 2020-02-07 NOTE — Telephone Encounter (Signed)
Patient called in and states he has been in A-fib since 7/22. He recently started back on his Xarelto medication and was advised to continue taking the blood thinner and to not miss any doses. He has a raging ear infection with pus and having pain. He is going to start the ear drops today and he is scheduled to see his pcp tomorrow. Per Clide Cliff Fenton-PA increase the Atenolol to 50mg  daily he was told to take 25mg  in the and 25mg  in the pm. He is scheduled to see this week. Patient verbalized understanding.

## 2020-02-10 ENCOUNTER — Other Ambulatory Visit: Payer: Self-pay

## 2020-02-10 ENCOUNTER — Ambulatory Visit (HOSPITAL_COMMUNITY)
Admission: RE | Admit: 2020-02-10 | Discharge: 2020-02-10 | Disposition: A | Discharge status: Home / Self Care | Payer: BC Managed Care – PPO | Source: Ambulatory Visit | Attending: Physician Assistant | Admitting: Physician Assistant

## 2020-02-10 ENCOUNTER — Encounter (HOSPITAL_COMMUNITY): Payer: Self-pay | Admitting: Physician Assistant

## 2020-02-10 VITALS — BP 110/82 | HR 64 | Ht 75.0 in | Wt 223.0 lb

## 2020-02-10 DIAGNOSIS — K219 Gastro-esophageal reflux disease without esophagitis: Secondary | ICD-10-CM | POA: Insufficient documentation

## 2020-02-10 DIAGNOSIS — R531 Weakness: Secondary | ICD-10-CM | POA: Diagnosis not present

## 2020-02-10 DIAGNOSIS — Z87891 Personal history of nicotine dependence: Secondary | ICD-10-CM | POA: Diagnosis not present

## 2020-02-10 DIAGNOSIS — G4733 Obstructive sleep apnea (adult) (pediatric): Secondary | ICD-10-CM | POA: Diagnosis not present

## 2020-02-10 DIAGNOSIS — Z7901 Long term (current) use of anticoagulants: Secondary | ICD-10-CM | POA: Diagnosis not present

## 2020-02-10 DIAGNOSIS — I4819 Other persistent atrial fibrillation: Secondary | ICD-10-CM | POA: Insufficient documentation

## 2020-02-10 DIAGNOSIS — Z79899 Other long term (current) drug therapy: Secondary | ICD-10-CM | POA: Diagnosis not present

## 2020-02-10 DIAGNOSIS — W1840XA Slipping, tripping and stumbling without falling, unspecified, initial encounter: Secondary | ICD-10-CM | POA: Diagnosis not present

## 2020-02-10 DIAGNOSIS — R29898 Other symptoms and signs involving the musculoskeletal system: Secondary | ICD-10-CM | POA: Diagnosis not present

## 2020-02-10 DIAGNOSIS — F419 Anxiety disorder, unspecified: Secondary | ICD-10-CM | POA: Diagnosis not present

## 2020-02-10 MED ORDER — FLECAINIDE ACETATE 50 MG PO TABS: ORAL_TABLET | ORAL | Status: DC

## 2020-02-10 NOTE — Progress Notes (Signed)
Primary Care Physician: [facility] Primary Cardiologist: Dr Jacques Navy Primary Electrophysiologist: none Referring Physician: Corine Shelter PA-C   Ryan [name] is a 54 y.o. male with a history of persistent atrial fibrillation and OSA who presents for follow up in the [facility].  The patient was initially diagnosed with atrial fibrillation 07/2018 at an ED visit but apparently went unnoticed until a preop appt for knee replacement in 10/2018. He saw Dr Jacques Navy 12/23/2018 and because of his history of chest pain was set up for OP cath. This was done 12/29/2018 and showed normal coronaries. Eliquis was added and the plan was for him to have OP DCCV. Echo 12/31/2018 showed normal LVF with an EF of 60-65% and normal LA size. He had an allergic reaction to Eliquis and was changed to Xarelto. He ultimatley underwent OP DCCV 02/25/2019. Unfortunately, patient had ERAF on follow up after DCCV. He states that immediately after his DCCV he felt better with more energy and less SOB. Of note, he had been on prednisone for his knee pain. He denies any significant alcohol use. He does have diagnosis of OSA and has used CPAP intermittently. Patient is s/p DCCV on 04/12/19. Patient reports that he missed several doses of his flecainide starting the weekend of 11/27/19 was back in afib with symptoms of fatigue. He is not compliant with his CPAP.  On follow up today, patient reports that he went back into afib on 01/20/20. There were no specific triggers he could identify. He denies alcohol use or missed doses of flecainide. He started taking Xarelto again on that same day. He also describes progressive muscle weakness and "dragging feet" over the last several months. He also reports a 30 lbs unintentional weight loss over the last year and a half although this has been stable for the last few months. His father had ALS.  Today, he denies symptoms of chest pain,  orthopnea, PND, lower extremity edema, dizziness, presyncope, syncope,bleeding, or neurologic sequela. The patient is tolerating medications without difficulties and is otherwise without complaint today.   Atrial Fibrillation Risk Factors:  he does have symptoms or diagnosis of sleep apnea. he is not compliant with CPAP therapy. he does not have a history of rheumatic fever. he does not have a history of alcohol use. The patient does have a history of early familial atrial fibrillation or other arrhythmias. Father had afib.  he has a BMI of Body mass index is 27.87 kg/m.Marland Kitchen Filed Weights   02/10/20 0932  Weight: 101.2 kg    Family History  Problem Relation Age of Onset  . Atrial fibrillation Father      Atrial Fibrillation Management history:  Previous antiarrhythmic drugs: flecainide Previous cardioversions: 02/25/19, 04/12/19 Previous ablations: none CHADS2VASC score: 0 Anticoagulation history: Eliquis (stopped 2/2 allergic reaction), Xarelto   Past Medical History:  Diagnosis Date  . Anxiety   . Arthritis    oa  . Chest pain since 02-11-2013   pt thinks associated with reflux  . Dyspnea    with exertion  . GERD (gastroesophageal reflux disease)   . H/O hiatal hernia    Past Surgical History:  Procedure Laterality Date  . ANKLE SURGERY Right    reconstruction to muscle  . CARDIOVERSION N/A 02/25/2019   Procedure: CARDIOVERSION;  Surgeon: Little Ishikawa, MD;  Location: [facility] ENDOSCOPY;  Service: Endoscopy;  Laterality: N/A;  . CARDIOVERSION N/A 04/12/2019   Procedure: CARDIOVERSION;  Surgeon: Lars Masson, MD;  Location: [facility] ENDOSCOPY;  Service: Cardiovascular;  Laterality: N/A;  . ESOPHAGEAL MANOMETRY N/A 04/17/2015   Procedure: ESOPHAGEAL MANOMETRY (EM);  Surgeon: Charna Elizabeth, MD;  Location: [facility] ENDOSCOPY;  Service: Endoscopy;  Laterality: N/A;  . ESOPHAGOGASTRODUODENOSCOPY  11/09/2003  . FOOT SURGERY     right  . HIP SURGERY  age 30   left, done x 2    . KNEE ARTHROSCOPY Left 10/20/2003 and 2012  . LEFT HEART CATH AND CORONARY ANGIOGRAPHY N/A 12/29/2018   Procedure: LEFT HEART CATH AND CORONARY ANGIOGRAPHY;  Surgeon: Lennette Bihari, MD;  Location: [facility] INVASIVE CV LAB;  Service: Cardiovascular;  Laterality: N/A;  . MYRINGOTOMY WITH TUBE PLACEMENT Right 08/29/2015   Procedure: MYRINGOTOMY WITH TUBE PLACEMENT;  Surgeon: Newman Pies, MD;  Location: [facility];  Service: ENT;  Laterality: Right;  . PH IMPEDANCE STUDY N/A 04/17/2015   Procedure: PH IMPEDANCE STUDY;  Surgeon: Charna Elizabeth, MD;  Location: [facility] ENDOSCOPY;  Service: Endoscopy;  Laterality: N/A;  . SEPTOPLASTY N/A 08/29/2015   Procedure: SEPTOPLASTY;  Surgeon: Newman Pies, MD;  Location: [facility];  Service: ENT;  Laterality: N/A;  . TOTAL HIP ARTHROPLASTY Left 02/23/2013   Procedure: LEFT TOTAL HIP ARTHROPLASTY ANTERIOR APPROACH;  Surgeon: Shelda Pal, MD;  Location: [facility] ORS;  Service: Orthopedics;  Laterality: Left;  . TOTAL KNEE ARTHROPLASTY Right 11/11/2018   Procedure: TOTAL KNEE ARTHROPLASTY;  Surgeon: [name], MD;  Location: [facility] ORS;  Service: Orthopedics;  Laterality: Right;   . TURBINATE REDUCTION Bilateral 08/29/2015   Procedure: TURBINATE REDUCTION;  Surgeon: Newman Pies, MD;  Location: [facility];  Service: ENT;  Laterality: Bilateral;  . URETHRA SURGERY   33yrs ago   for scar tissue    Current Outpatient Medications  Medication Sig Dispense Refill  . atenolol (TENORMIN) 25 MG tablet Take 1 tablet (25 mg total) by mouth 2 (two) times daily.    . flecainide (TAMBOCOR) 50 MG tablet Take 100mg  by mouth in the am and 100mg  in the pm    . gabapentin (NEURONTIN) 300 MG capsule Take 600 mg by mouth at bedtime.    . rivaroxaban (XARELTO) 20 MG TABS tablet Take 1 tablet (20 mg total) by mouth daily with supper. 30 tablet 0  . ciprofloxacin (CILOXAN) 0.3 % ophthalmic solution SMARTSIG:4 Drop(s) In Ear(s)    . prednisoLONE acetate (PRED  FORTE) 1 % ophthalmic suspension SMARTSIG:4 Drop(s) In Ear(s)     No current facility-administered medications for this encounter.    Allergies  Allergen Reactions  . Eliquis [Apixaban] Other (See Comments)    Rash    Social History   Socioeconomic History  . Marital status: Married    Spouse name: Not on file  . Number of children: Not on file  . Years of education: Not on file  . Highest education level: Not on file  Occupational History  . Not on file  Tobacco Use  . Smoking status: Former Smoker    Packs/day: 1.50    Years: 25.00    Pack years: 37.50    Types: Cigarettes    Quit date: 05/29/2017    Years since quitting: 2.7  . Smokeless tobacco: Never Used  . Tobacco comment: quit 2018  Vaping Use  . Vaping Use: Never used  Substance and Sexual Activity  . Alcohol use: Yes    Alcohol/week: 2.0 standard drinks    Types: 2 Standard drinks or equivalent per week    Comment: ocassionally  .  Drug use: Not Currently    Types: Marijuana    Comment: occ marijuana last used 1 week ago  . Sexual activity: Not on file  Other Topics Concern  . Not on file  Social History Narrative  . Not on file   Social Determinants of Health   Financial Resource Strain:   . Difficulty of Paying Living Expenses:   Food Insecurity:   . Worried About Programme researcher, broadcasting/film/videounning Out of Food in the Last Year:   . Baristaan Out of Food in the Last Year:   Transportation Needs:   . Freight forwarderLack of Transportation (Medical):   Marland Kitchen. Lack of Transportation (Non-Medical):   Physical Activity:   . Days of Exercise per Week:   . Minutes of Exercise per Session:   Stress:   . Feeling of Stress :   Social Connections:   . Frequency of Communication with Friends and Family:   . Frequency of Social Gatherings with Friends and Family:   . Attends Religious Services:   . Active Member of Clubs or Organizations:   . Attends BankerClub or Organization Meetings:   Marland Kitchen. Marital Status:   Intimate Partner Violence:   . Fear of Current or  Ex-Partner:   . Emotionally Abused:   Marland Kitchen. Physically Abused:   . Sexually Abused:      ROS- All systems are reviewed and negative except as per the HPI above.  Physical Exam: Vitals:   02/10/20 0932  BP: 110/82  Pulse: 64  Weight: 101.2 kg  Height: 6\' 3"  (1.905 m)    GEN- The patient is well appearing, alert and oriented x 3 today.   HEENT-head normocephalic, atraumatic, sclera clear, conjunctiva pink, hearing intact, trachea midline. Lungs- Clear to ausculation bilaterally, normal work of breathing Heart- irregular rate and rhythm, no murmurs, rubs or gallops  GI- soft, NT, ND, + BS Extremities- no clubbing, cyanosis, or edema MS- no significant deformity or atrophy Skin- no rash or lesion Psych- euthymic mood, full affect Neuro- strength and sensation are intact   Wt Readings from Last 3 Encounters:  02/10/20 101.2 kg  12/20/19 103.8 kg  12/02/19 101.4 kg    EKG today demonstrates afib HR 64, QRS 88, QTc 381  Echo 12/31/18 demonstrated  1. The left ventricle has normal systolic function with an ejection fraction of 60-65%. The cavity size was normal. Left ventricular diastolic function could not be evaluated secondary to atrial fibrillation.  2. The right ventricle has normal systolic function. The cavity was normal.  3. The aortic valve is tricuspid. Mild thickening of the aortic valve. No stenosis of the aortic valve.  4. Pt in atrial fibrillation during the study; normal LV function; no significant valvular heart disease.  Epic records are reviewed at length today  Assessment and Plan:  1. Persistent atrial fibrillation Patient back in rate controlled afib. Continue Xarelto 20 mg daily (started 01/19/20) Increase flecainide to 100 mg BID. Patient interested in ablation. Will refer to EP to consider. Continue atenolol 25 mg daily Kardia for home monitoring   This patients CHA2DS2-VASc Score and unadjusted Ischemic Stroke Rate (% per year) is equal to 0.2 % stroke  rate/year from a score of 0  Above score calculated as 1 point each if present [CHF, HTN, DM, Vascular=MI/PAD/Aortic Plaque, Age if 65-74, or Male] Above score calculated as 2 points each if present [Age > 75, or Stroke/TIA/TE]  2. OSA Patient is not compliant with CPAP therapy. The importance of adequate treatment of sleep apnea was discussed  today in order to improve our ability to maintain sinus rhythm long term. He is agreeable to resuming.  3. Progressive Weakness Patient reporting weight loss, muscle weakness, frequent tripping, and feeling that his feet are "dragging." He has a family history of ALS (father). Will refer to neurology for evaluation.    Follow up with EP to consider ablation. Follow up with Dr Jacques Navy as scheduled.    Jorja Loa PA-C Af[facility] [address] [place], [place] [address] [phone] 02/10/2020 8:56 PM

## 2020-02-10 NOTE — Patient Instructions (Signed)
Increase Flecainide 100mg  by mouth twice daily Neurology will contact regarding appointment Heart care will call you regarding apppointment -(To see Dr. )

## 2020-02-15 ENCOUNTER — Other Ambulatory Visit (HOSPITAL_COMMUNITY): Payer: Self-pay | Admitting: *Deleted

## 2020-02-15 MED ORDER — FLECAINIDE ACETATE 100 MG PO TABS: 100.0000 mg | ORAL_TABLET | Freq: Two times a day (BID) | ORAL | 3 refills | Status: DC

## 2020-02-21 ENCOUNTER — Ambulatory Visit (HOSPITAL_COMMUNITY)
Admission: RE | Admit: 2020-02-21 | Discharge: 2020-02-21 | Disposition: A | Discharge status: Home / Self Care | Payer: BC Managed Care – PPO | Source: Ambulatory Visit | Attending: Physician Assistant | Admitting: Physician Assistant

## 2020-02-21 ENCOUNTER — Other Ambulatory Visit: Payer: Self-pay

## 2020-02-21 VITALS — BP 120/80 | HR 60

## 2020-02-21 DIAGNOSIS — I4891 Unspecified atrial fibrillation: Secondary | ICD-10-CM | POA: Insufficient documentation

## 2020-02-21 DIAGNOSIS — I4819 Other persistent atrial fibrillation: Secondary | ICD-10-CM

## 2020-02-21 NOTE — Progress Notes (Signed)
Patient returns for ECG after increasing flecainide. ECG shows afib HR 60, QRS 86, QTc 382. Patient still has symptoms of fatigue and intermittent SOB. He has an upcoming appt with EP to consider ablation. Offered DCCV in the meantime, patient declined until after he sees EP. Follow up as scheduled.

## 2020-02-26 NOTE — Progress Notes (Signed)
Electrophysiology Office Note:    Date:  02/28/2020   ID:  Ryan [name], DOB [birth year], MRN [id number]  [place]P:  [place], [facility] Cardiologist:  Parke PoissonGayatri A Acharya, MD  [facility] Electrophysiologist:  Lanier Prudeameron T Marleen Moret, MD   Referring MD: Danice GoltzFenton, Clint R, [place]   Chief Complaint: AF  History of Present Illness:    Ryan [name] is a 54 y.o. male with a hx of symptomatic AF diagnosed 07/2018 managed with rivaroxaban (previously on eliquis but had allergic reaction to the medication) and flecainide. He has previously been cardioverted but had ERAF. While in sinus rhythm he had more energy and less dyspneic. He then started flecainide and was cardioverted and stayed in sinus for 6 months before returning to AF. He can tell a difference in his exercise capacity when in AF.   He tells me that he has lost about 30 lbs this past year. He says that he changed his diet to achieve the weight loss, cutting out red meat and now he eats more vegetables, grains, chicken. 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 Has previously had a sleep study and had very mild OSA. He has a CPAP at home and goes to sleep with it on but frequently wakes up with the mask off. He continues to work on this.   Medical history includes ALS in his father. He is seeing a neurologist to confirm he does not have signs of ALS.  Past Medical History:  Diagnosis Date   Anxiety    Arthritis    oa   Chest pain since 02-11-2013   pt thinks associated with reflux   Dyspnea    with exertion   GERD (gastroesophageal reflux disease)    H/O hiatal hernia     Past Surgical History:  Procedure Laterality Date   ANKLE SURGERY Right    reconstruction to muscle   CARDIOVERSION N/A 02/25/2019   Procedure: CARDIOVERSION;  Surgeon: Little IshikawaSchumann, Christopher L, MD;  [facility];  Service: Endoscopy;  Laterality: N/A;   CARDIOVERSION N/A  04/12/2019   Procedure: CARDIOVERSION;  Surgeon: Lars MassonNelson, Katarina H, MD;  [facility];  Service: Cardiovascular;  Laterality: N/A;   ESOPHAGEAL MANOMETRY N/A 04/17/2015   Procedure: ESOPHAGEAL MANOMETRY (EM);  Surgeon: Charna ElizabethJyothi Mann, MD;  Location: [facility] ENDOSCOPY;  Service: Endoscopy;  Laterality: N/A;   ESOPHAGOGASTRODUODENOSCOPY  11/09/2003   FOOT SURGERY     right   HIP SURGERY  age 62   left, done x 2   KNEE ARTHROSCOPY Left 10/20/2003 and 2012   LEFT HEART CATH AND CORONARY ANGIOGRAPHY N/A 12/29/2018   Procedure: LEFT HEART CATH AND CORONARY ANGIOGRAPHY;  Surgeon: Lennette BihariKelly, Thomas A, MD;  Location: [facility] INVASIVE CV LAB;  Service: Cardiovascular;  Laterality: N/A;   MYRINGOTOMY WITH TUBE PLACEMENT Right 08/29/2015   Procedure: MYRINGOTOMY WITH TUBE PLACEMENT;  Surgeon: Newman PiesSu Teoh, MD;  Location: [facility];  Service: ENT;  Laterality: Right;   PH IMPEDANCE STUDY N/A 04/17/2015   Procedure: PH IMPEDANCE STUDY;  Surgeon: Charna ElizabethJyothi Mann, MD;  Location: [facility] ENDOSCOPY;  Service: Endoscopy;  Laterality: N/A;   SEPTOPLASTY N/A 08/29/2015   Procedure: SEPTOPLASTY;  Surgeon: Newman PiesSu Teoh, MD;  Location: [facility];  Service: ENT;  Laterality: N/A;   TOTAL HIP ARTHROPLASTY Left 02/23/2013   Procedure: LEFT TOTAL HIP ARTHROPLASTY ANTERIOR APPROACH;  Surgeon: Shelda PalMatthew D Olin, MD;  [facility];  Service: Orthopedics;  Laterality: Left;   TOTAL KNEE ARTHROPLASTY Right 11/11/2018   Procedure: TOTAL KNEE ARTHROPLASTY;  Surgeon: [name], MD;  Location: [facility] ORS;  Service: Orthopedics;  Laterality: Right;    TURBINATE REDUCTION Bilateral 08/29/2015   Procedure: TURBINATE REDUCTION;  Surgeon: Newman Pies, MD;  Location: [facility];  Service: ENT;  Laterality: Bilateral;   URETHRA SURGERY   15yrs ago   for scar tissue    Current Medications: Current Meds  Medication Sig   atenolol (TENORMIN) 25 MG tablet Take 1 tablet (25 mg total) by mouth 2 (two)  times daily.   ciprofloxacin (CILOXAN) 0.3 % ophthalmic solution SMARTSIG:4 Drop(s) In Ear(s)   flecainide (TAMBOCOR) 100 MG tablet Take 1 tablet (100 mg total) by mouth 2 (two) times daily.   gabapentin (NEURONTIN) 300 MG capsule Take 600 mg by mouth at bedtime.   prednisoLONE acetate (PRED FORTE) 1 % ophthalmic suspension SMARTSIG:4 Drop(s) In Ear(s)     Allergies:   Eliquis [apixaban]   Social History   Socioeconomic History   Marital status: Married    Spouse name: Not on file   Number of children: Not on file   Years of education: Not on file   Highest education level: Not on file  Occupational History   Not on file  Tobacco Use   Smoking status: Former Smoker    Packs/day: 1.50    Years: 25.00    Pack years: 37.50    Types: Cigarettes    Quit date: 05/29/2017    Years since quitting: 2.7   Smokeless tobacco: Never Used   Tobacco comment: quit 2018  Vaping Use   Vaping Use: Never used  Substance and Sexual Activity   Alcohol use: Yes    Alcohol/week: 2.0 standard drinks    Types: 2 Standard drinks or equivalent per week    Comment: ocassionally   Drug use: Not Currently    Types: Marijuana    Comment: occ marijuana last used 1 week ago   Sexual activity: Not on file  Other Topics Concern   Not on file  Social History Narrative   Not on file   Social Determinants of Health   Financial Resource Strain:    Difficulty of Paying Living Expenses: Not on file  Food Insecurity:    Worried About Programme researcher, broadcasting/film/video in the Last Year: Not on file   [employer] of Food in the Last Year: Not on file  Transportation Needs:    Lack of Transportation (Medical): Not on file   Lack of Transportation (Non-Medical): Not on file  Physical Activity:    Days of Exercise per Week: Not on file   Minutes of Exercise per Session: Not on file  Stress:    Feeling of Stress : Not on file  Social Connections:    Frequency of Communication with Friends and  Family: Not on file   Frequency of Social Gatherings with Friends and Family: Not on file   Attends Religious Services: Not on file   Active Member of Clubs or Organizations: Not on file   Attends Banker Meetings: Not on file   Marital Status: Not on file     Family History: The patient's family history includes Atrial fibrillation in his father.  ROS:   Please see the history of present illness.    All other systems reviewed and are negative.  EKGs/Labs/Other Studies Reviewed:    The following studies were reviewed  today:  Echo 12/31/2018 1. The left ventricle has normal systolic function with an ejection fraction of 60-65%. The cavity size was normal. Left ventricular diastolic function could not be evaluated secondary to atrial fibrillation. 2. The right ventricle has normal systolic function. The cavity was normal. 3. The aortic valve is tricuspid. Mild thickening of the aortic valve. No stenosis of the aortic valve. 4. Pt in atrial fibrillation during the study; normal LV function; no significant valvular heart disease.  12/29/2018 Cath Essentially normal epicardial coronary arteries without obstructive disease. Normal global LV function with EF estimated 50%; LVEDP 13 mm.   EKG:  The ekg ordered today demonstrates atrial fibrillation.  Recent Labs: 04/07/2019: BUN 9; Creatinine, Ser 0.98; Hemoglobin 14.5; Platelets 246; Potassium 4.4; Sodium 140  Recent Lipid Panel No results found for: CHOL, TRIG, HDL, CHOLHDL, VLDL, LDLCALC, LDLDIRECT  Physical Exam:    VS:  BP 126/80    Pulse 68    Ht 6\' 3"  (1.905 m)    Wt 226 lb 3.2 oz (102.6 kg)    SpO2 98%    BMI 28.27 kg/m     Wt Readings from Last 3 Encounters:  02/28/20 226 lb 3.2 oz (102.6 kg)  02/10/20 223 lb (101.2 kg)  12/20/19 228 lb 12.8 oz (103.8 kg)     GEN:  Well nourished, well developed in no acute distress HEENT: Normal NECK: No JVD; No carotid bruits LYMPHATICS: No  lymphadenopathy CARDIAC: irregularly irregular, no murmurs, rubs, gallops RESPIRATORY:  Clear to auscultation without rales, wheezing or rhonchi  ABDOMEN: Soft, non-tender, non-distended MUSCULOSKELETAL:  No edema; No deformity  SKIN: Warm and dry NEUROLOGIC:  Alert and oriented x 3 PSYCHIATRIC:  Normal affect   ASSESSMENT:    1. Persistent atrial fibrillation (HCC)    PLAN:    In order of problems listed above:  1. Persistent atrial fibrillation Tolerating rivaroxaban and flecainide. Recently increased flecainide dose to 100mg  BID. Echo shows normal size LA in 2020. Given his LA anatomy and symptomatic nature of his AF, I believe ablation is a good option for rhythm control for the patient.  Ablation strategy will be PVI, posterior wall isolation. I want to update the echo prior to the ablation.   Will plan to stop the flecainide and atenolol 3 mos after the ablation.  Risk, benefits, and alternatives to EP study and radiofrequency ablation for afib were also discussed in detail today. These risks include but are not limited to stroke, bleeding, vascular damage, tamponade, perforation, damage to the esophagus, lungs, and other structures, pulmonary vein stenosis, worsening renal function, and death. The patient understands these risk and wishes to proceed.  We will therefore proceed with catheter ablation at the next available time.  Carto, ICE, anesthesia are requested for the procedure.  Will also obtain CT PV protocol prior to the procedure to exclude LAA thrombus and further evaluate atrial anatomy.    Medication Adjustments/Labs and Tests Ordered: Current medicines are reviewed at length with the patient today.  Concerns regarding medicines are outlined above.  Orders Placed This Encounter  Procedures   EKG 12-Lead   No orders of the defined types were placed in this encounter.   Patient Instructions  Medication Instructions:  Your physician recommends that you  continue on your current medications as directed. Please refer to the Current Medication list given to you today.  Labwork: You will get lab work today:  BMP and CBC  Testing/Procedures: None ordered.  Follow-Up:  SEE INSTRUCTION LETTER  Any Other Special Instructions Will Be Listed Below (If Applicable).  If you need a refill on your cardiac medications before your next appointment, please call your pharmacy.    Cardiac Ablation Cardiac ablation is a procedure to disable (ablate) a small amount of heart tissue in very specific places. The heart has many electrical connections. Sometimes these connections are abnormal and can cause the heart to beat very fast or irregularly. Ablating some of the problem areas can improve the heart rhythm or return it to normal. Ablation may be done for people who:  Have Wolff-Parkinson-White syndrome.  Have fast heart rhythms (tachycardia).  Have taken medicines for an abnormal heart rhythm (arrhythmia) that were not effective or caused side effects.  Have a high-risk heartbeat that may be life-threatening. During the procedure, a small incision is made in the neck or the groin, and a long, thin, flexible tube (catheter) is inserted into the incision and moved to the heart. Small devices (electrodes) on the tip of the catheter will send out electrical currents. A type of X-ray (fluoroscopy) will be used to help guide the catheter and to provide images of the heart. Tell a health care provider about:  Any allergies you have.  All medicines you are taking, including vitamins, herbs, eye drops, creams, and over-the-counter medicines.  Any problems you or family members have had with anesthetic medicines.  Any blood disorders you have.  Any surgeries you have had.  Any medical conditions you have, such as kidney failure.  Whether you are pregnant or may be pregnant. What are the risks? Generally, this is a safe procedure. However, problems may  occur, including:  Infection.  Bruising and bleeding at the catheter insertion site.  Bleeding into the chest, especially into the sac that surrounds the heart. This is a serious complication.  Stroke or blood clots.  Damage to other structures or organs.  Allergic reaction to medicines or dyes.  Need for a permanent pacemaker if the normal electrical system is damaged. A pacemaker is a small computer that sends electrical signals to the heart and helps your heart beat normally.  The procedure not being fully effective. This may not be recognized until months later. Repeat ablation procedures are sometimes required. What happens before the procedure?  Follow instructions from your health care provider about eating or drinking restrictions.  Ask your health care provider about: ? Changing or stopping your regular medicines. This is especially important if you are taking diabetes medicines or blood thinners. ? Taking medicines such as aspirin and ibuprofen. These medicines can thin your blood. Do not take these medicines before your procedure if your health care provider instructs you not to.  Plan to have someone take you home from the hospital or clinic.  If you will be going home right after the procedure, plan to have someone with you for 24 hours. What happens during the procedure?  To lower your risk of infection: ? Your health care team will wash or sanitize their hands. ? Your skin will be washed with soap. ? Hair may be removed from the incision area.  An IV tube will be inserted into one of your veins.  You will be given a medicine to help you relax (sedative).  The skin on your neck or groin will be numbed.  An incision will be made in your neck or your groin.  A needle will be inserted through the incision and into a large vein in your neck or groin.  A catheter will be inserted into the needle and moved to your heart.  Dye may be injected through the catheter  to help your surgeon see the area of the heart that needs treatment.  Electrical currents will be sent from the catheter to ablate heart tissue in desired areas. There are three types of energy that may be used to ablate heart tissue: ? Heat (radiofrequency energy). ? Laser energy. ? Extreme cold (cryoablation).  When the necessary tissue has been ablated, the catheter will be removed.  Pressure will be held on the catheter insertion area to prevent excessive bleeding.  A bandage (dressing) will be placed over the catheter insertion area. The procedure may vary among health care providers and hospitals. What happens after the procedure?  Your blood pressure, heart rate, breathing rate, and blood oxygen level will be monitored until the medicines you were given have worn off.  Your catheter insertion area will be monitored for bleeding. You will need to lie still for a few hours to ensure that you do not bleed from the catheter insertion area.  Do not drive for 24 hours or as long as directed by your health care provider. Summary  Cardiac ablation is a procedure to disable (ablate) a small amount of heart tissue in very specific places. Ablating some of the problem areas can improve the heart rhythm or return it to normal.  During the procedure, electrical currents will be sent from the catheter to ablate heart tissue in desired areas. This information is not intended to replace advice given to you by your health care provider. Make sure you discuss any questions you have with your health care provider. Document Revised: 12/08/2017 Document Reviewed: 05/06/2016 Elsevier Patient Education  2020 [employer].     Signed, Lanier Prude, MD  02/28/2020 9:32 AM    [facility]    Anticoagulation instructions: The patient does not need to hold their anticoagulation.  Medication instructions morning of: The patient should hold ALL medications the morning of  the procedure   Discharge: Our plan will be to discharge the patient same day after a period of observation

## 2020-02-28 ENCOUNTER — Ambulatory Visit (INDEPENDENT_AMBULATORY_CARE_PROVIDER_SITE_OTHER): Payer: BC Managed Care – PPO | Admitting: Cardiology

## 2020-02-28 ENCOUNTER — Encounter: Payer: Self-pay | Admitting: Cardiology

## 2020-02-28 ENCOUNTER — Other Ambulatory Visit: Payer: Self-pay

## 2020-02-28 VITALS — BP 126/80 | HR 68 | Ht 75.0 in | Wt 226.2 lb

## 2020-02-28 DIAGNOSIS — I4819 Other persistent atrial fibrillation: Secondary | ICD-10-CM

## 2020-02-28 DIAGNOSIS — I4891 Unspecified atrial fibrillation: Secondary | ICD-10-CM | POA: Diagnosis not present

## 2020-02-28 MED ORDER — METOPROLOL TARTRATE 100 MG PO TABS: 100.0000 mg | ORAL_TABLET | Freq: Two times a day (BID) | ORAL | 3 refills | Status: DC

## 2020-02-28 MED ORDER — METOPROLOL TARTRATE 100 MG PO TABS: ORAL_TABLET | ORAL | 0 refills | Status: DC

## 2020-02-28 NOTE — Addendum Note (Signed)
Addended by: Roney Mans A on: 02/28/2020 12:46 PM   Modules accepted: Orders

## 2020-02-28 NOTE — Patient Instructions (Addendum)
Medication Instructions:  Your physician recommends that you continue on your current medications as directed. Please refer to the Current Medication list given to you today.  Labwork: You will get lab work AFTER your ECHO on March 02, 2020:  BMP and CBC  Testing/Procedures: Your physician has requested that you have an echocardiogram. Echocardiography is a painless test that uses sound waves to create images of your heart. It provides your doctor with information about the size and shape of your heart and how well your heart's chambers and valves are working. This procedure takes approximately one hour. There are no restrictions for this procedure.  Your echo is scheduled for March 02, 2020 at 1:00 pm at the [facility] office.  Please arrive 15 minutes early to get checked in.  Do NOT forget your labs AFTER!!!  Follow-Up:  SEE INSTRUCTION LETTER  Any Other Special Instructions Will Be Listed Below (If Applicable).  If you need a refill on your cardiac medications before your next appointment, please call your pharmacy.    Cardiac Ablation Cardiac ablation is a procedure to disable (ablate) a small amount of heart tissue in very specific places. The heart has many electrical connections. Sometimes these connections are abnormal and can cause the heart to beat very fast or irregularly. Ablating some of the problem areas can improve the heart rhythm or return it to normal. Ablation may be done for people who:  Have Wolff-Parkinson-White syndrome.  Have fast heart rhythms (tachycardia).  Have taken medicines for an abnormal heart rhythm (arrhythmia) that were not effective or caused side effects.  Have a high-risk heartbeat that may be life-threatening. During the procedure, a small incision is made in the neck or the groin, and a long, thin, flexible tube (catheter) is inserted into the incision and moved to the heart. Small devices (electrodes) on the tip of the catheter will send  out electrical currents. A type of X-ray (fluoroscopy) will be used to help guide the catheter and to provide images of the heart. Tell a health care provider about:  Any allergies you have.  All medicines you are taking, including vitamins, herbs, eye drops, creams, and over-the-counter medicines.  Any problems you or family members have had with anesthetic medicines.  Any blood disorders you have.  Any surgeries you have had.  Any medical conditions you have, such as kidney failure.  Whether you are pregnant or may be pregnant. What are the risks? Generally, this is a safe procedure. However, problems may occur, including:  Infection.  Bruising and bleeding at the catheter insertion site.  Bleeding into the chest, especially into the sac that surrounds the heart. This is a serious complication.  Stroke or blood clots.  Damage to other structures or organs.  Allergic reaction to medicines or dyes.  Need for a permanent pacemaker if the normal electrical system is damaged. A pacemaker is a small computer that sends electrical signals to the heart and helps your heart beat normally.  The procedure not being fully effective. This may not be recognized until months later. Repeat ablation procedures are sometimes required. What happens before the procedure?  Follow instructions from your health care provider about eating or drinking restrictions.  Ask your health care provider about: ? Changing or stopping your regular medicines. This is especially important if you are taking diabetes medicines or blood thinners. ? Taking medicines such as aspirin and ibuprofen. These medicines can thin your blood. Do not take these medicines before your procedure if  your health care provider instructs you not to.  Plan to have someone take you home from the hospital or clinic.  If you will be going home right after the procedure, plan to have someone with you for 24 hours. What happens  during the procedure?  To lower your risk of infection: ? Your health care team will wash or sanitize their hands. ? Your skin will be washed with soap. ? Hair may be removed from the incision area.  An IV tube will be inserted into one of your veins.  You will be given a medicine to help you relax (sedative).  The skin on your neck or groin will be numbed.  An incision will be made in your neck or your groin.  A needle will be inserted through the incision and into a large vein in your neck or groin.  A catheter will be inserted into the needle and moved to your heart.  Dye may be injected through the catheter to help your surgeon see the area of the heart that needs treatment.  Electrical currents will be sent from the catheter to ablate heart tissue in desired areas. There are three types of energy that may be used to ablate heart tissue: ? Heat (radiofrequency energy). ? Laser energy. ? Extreme cold (cryoablation).  When the necessary tissue has been ablated, the catheter will be removed.  Pressure will be held on the catheter insertion area to prevent excessive bleeding.  A bandage (dressing) will be placed over the catheter insertion area. The procedure may vary among health care providers and hospitals. What happens after the procedure?  Your blood pressure, heart rate, breathing rate, and blood oxygen level will be monitored until the medicines you were given have worn off.  Your catheter insertion area will be monitored for bleeding. You will need to lie still for a few hours to ensure that you do not bleed from the catheter insertion area.  Do not drive for 24 hours or as long as directed by your health care provider. Summary  Cardiac ablation is a procedure to disable (ablate) a small amount of heart tissue in very specific places. Ablating some of the problem areas can improve the heart rhythm or return it to normal.  During the procedure, electrical currents  will be sent from the catheter to ablate heart tissue in desired areas. This information is not intended to replace advice given to you by your health care provider. Make sure you discuss any questions you have with your health care provider. Document Revised: 12/08/2017 Document Reviewed: 05/06/2016 Elsevier Patient Education  2020 [employer].

## 2020-03-02 ENCOUNTER — Other Ambulatory Visit: Payer: BC Managed Care – PPO

## 2020-03-02 ENCOUNTER — Other Ambulatory Visit: Payer: Self-pay

## 2020-03-02 ENCOUNTER — Ambulatory Visit (HOSPITAL_COMMUNITY): Payer: BC Managed Care – PPO | Attending: Cardiology

## 2020-03-02 DIAGNOSIS — I4891 Unspecified atrial fibrillation: Secondary | ICD-10-CM

## 2020-03-02 DIAGNOSIS — I4819 Other persistent atrial fibrillation: Secondary | ICD-10-CM | POA: Diagnosis present

## 2020-03-02 LAB — ECHOCARDIOGRAM COMPLETE: S' Lateral: 3.5 cm

## 2020-03-03 LAB — BASIC METABOLIC PANEL
BUN/Creatinine Ratio: 12 (ref 9–20)
BUN: 12 mg/dL (ref 6–24)
CO2: 24 mmol/L (ref 20–29)
Calcium: 9.2 mg/dL (ref 8.7–10.2)
Chloride: 107 mmol/L — ABNORMAL HIGH (ref 96–106)
Creatinine, Ser: 1 mg/dL (ref 0.76–1.27)
GFR calc Af Amer: 99 mL/min/{1.73_m2} (ref >59)
GFR calc non Af Amer: 86 mL/min/{1.73_m2} (ref >59)
Glucose: 81 mg/dL (ref 65–99)
Potassium: 4.4 mmol/L (ref 3.5–5.2)
Sodium: 143 mmol/L (ref 134–144)

## 2020-03-03 LAB — CBC WITH DIFFERENTIAL/PLATELET
Basophils Absolute: 0.1 10*3/uL (ref 0.0–0.2)
Basos: 1 %
EOS (ABSOLUTE): 0.2 10*3/uL (ref 0.0–0.4)
Eos: 2 %
Hematocrit: 37.1 % — ABNORMAL LOW (ref 37.5–51.0)
Hemoglobin: 12.9 g/dL — ABNORMAL LOW (ref 13.0–17.7)
Immature Grans (Abs): 0.1 10*3/uL (ref 0.0–0.1)
Immature Granulocytes: 1 %
Lymphocytes Absolute: 2.1 10*3/uL (ref 0.7–3.1)
Lymphs: 27 %
MCH: 33 pg (ref 26.6–33.0)
MCHC: 34.8 g/dL (ref 31.5–35.7)
MCV: 95 fL (ref 79–97)
Monocytes Absolute: 1 10*3/uL — ABNORMAL HIGH (ref 0.1–0.9)
Monocytes: 13 %
Neutrophils Absolute: 4.4 10*3/uL (ref 1.4–7.0)
Neutrophils: 56 %
Platelets: 219 10*3/uL (ref 150–450)
RBC: 3.91 x10E6/uL — ABNORMAL LOW (ref 4.14–5.80)
RDW: 12.6 % (ref 11.6–15.4)
WBC: 7.9 10*3/uL (ref 3.4–10.8)

## 2020-03-07 DIAGNOSIS — B356 Tinea cruris: Secondary | ICD-10-CM | POA: Insufficient documentation

## 2020-03-07 DIAGNOSIS — R21 Rash and other nonspecific skin eruption: Secondary | ICD-10-CM | POA: Insufficient documentation

## 2020-03-21 ENCOUNTER — Other Ambulatory Visit (HOSPITAL_COMMUNITY): Payer: Self-pay | Admitting: *Deleted

## 2020-03-21 ENCOUNTER — Ambulatory Visit (HOSPITAL_COMMUNITY): Payer: BC Managed Care – PPO | Admitting: Physician Assistant

## 2020-03-21 MED ORDER — ATENOLOL 25 MG PO TABS
25.0000 mg | ORAL_TABLET | Freq: Two times a day (BID) | ORAL | 6 refills | Status: DC
Start: 2020-03-21 — End: 2020-04-27

## 2020-03-27 ENCOUNTER — Telehealth (HOSPITAL_COMMUNITY): Payer: Self-pay | Admitting: Emergency Medicine

## 2020-03-27 NOTE — Telephone Encounter (Signed)
Attempted to call patient regarding upcoming cardiac CT appointment. °Left message on voicemail with name and callback number °Stormie Ventola RN Navigator Cardiac Imaging °[facility] °[phone] Office °[phone] Cell ° °

## 2020-03-28 ENCOUNTER — Other Ambulatory Visit (HOSPITAL_COMMUNITY)
Admission: RE | Admit: 2020-03-28 | Discharge: 2020-03-28 | Disposition: A | Discharge status: Home / Self Care | Payer: BC Managed Care – PPO | Source: Ambulatory Visit | Attending: Cardiology | Admitting: Cardiology

## 2020-03-28 ENCOUNTER — Ambulatory Visit (HOSPITAL_COMMUNITY)
Admission: RE | Admit: 2020-03-28 | Discharge: 2020-03-28 | Disposition: A | Discharge status: Home / Self Care | Payer: BC Managed Care – PPO | Source: Ambulatory Visit | Attending: Cardiology | Admitting: Cardiology

## 2020-03-28 ENCOUNTER — Ambulatory Visit: Payer: BC Managed Care – PPO | Admitting: Internal Medicine

## 2020-03-28 ENCOUNTER — Other Ambulatory Visit: Payer: Self-pay

## 2020-03-28 DIAGNOSIS — Z20822 Contact with and (suspected) exposure to covid-19: Secondary | ICD-10-CM | POA: Insufficient documentation

## 2020-03-28 DIAGNOSIS — Q211 Atrial septal defect: Secondary | ICD-10-CM | POA: Diagnosis not present

## 2020-03-28 DIAGNOSIS — I700 Atherosclerosis of aorta: Secondary | ICD-10-CM | POA: Insufficient documentation

## 2020-03-28 DIAGNOSIS — Z01812 Encounter for preprocedural laboratory examination: Secondary | ICD-10-CM | POA: Diagnosis not present

## 2020-03-28 DIAGNOSIS — I4891 Unspecified atrial fibrillation: Secondary | ICD-10-CM

## 2020-03-28 LAB — SARS CORONAVIRUS 2 (TAT 6-24 HRS): SARS Coronavirus 2: NEGATIVE

## 2020-03-28 MED ORDER — IOHEXOL 350 MG/ML SOLN
80.0000 mL | Freq: Once | INTRAVENOUS | Status: AC | PRN
  Administered 2020-03-28: 80 mL via INTRAVENOUS

## 2020-03-29 NOTE — Progress Notes (Signed)
Instructed patient on the following items: Arrival time 0530 Nothing to eat or drink after midnight No meds AM of procedure Responsible person to drive you home and stay with you for 24 hrs  Have you missed any doses of anti-coagulant-Xarelto hasn't missed any doses.  Pt states he has had a rash both groins for last several weeks, went to Dr about it, they prescribed 3 different creams.  Patient states rash is better.  Notified Dr Lalla Brothers, ok to bring in for procedure, he will assess in the morning.

## 2020-03-30 ENCOUNTER — Ambulatory Visit (HOSPITAL_COMMUNITY): Payer: BC Managed Care – PPO | Admitting: Certified Registered"

## 2020-03-30 ENCOUNTER — Ambulatory Visit (HOSPITAL_COMMUNITY)
Admission: RE | Admit: 2020-03-30 | Discharge: 2020-03-30 | Disposition: A | Discharge status: Home / Self Care | Payer: BC Managed Care – PPO | Attending: Cardiology | Admitting: Cardiology

## 2020-03-30 ENCOUNTER — Encounter (HOSPITAL_COMMUNITY)
Admission: RE | Disposition: A | Discharge status: Home / Self Care | Payer: BC Managed Care – PPO | Source: Home / Self Care | Attending: Cardiology

## 2020-03-30 ENCOUNTER — Other Ambulatory Visit: Payer: Self-pay

## 2020-03-30 DIAGNOSIS — M1990 Unspecified osteoarthritis, unspecified site: Secondary | ICD-10-CM | POA: Diagnosis not present

## 2020-03-30 DIAGNOSIS — I4819 Other persistent atrial fibrillation: Secondary | ICD-10-CM | POA: Insufficient documentation

## 2020-03-30 DIAGNOSIS — Z79899 Other long term (current) drug therapy: Secondary | ICD-10-CM | POA: Diagnosis not present

## 2020-03-30 DIAGNOSIS — Z87891 Personal history of nicotine dependence: Secondary | ICD-10-CM | POA: Diagnosis not present

## 2020-03-30 DIAGNOSIS — Z7901 Long term (current) use of anticoagulants: Secondary | ICD-10-CM | POA: Diagnosis not present

## 2020-03-30 DIAGNOSIS — Z96653 Presence of artificial knee joint, bilateral: Secondary | ICD-10-CM | POA: Diagnosis not present

## 2020-03-30 DIAGNOSIS — G4733 Obstructive sleep apnea (adult) (pediatric): Secondary | ICD-10-CM | POA: Diagnosis not present

## 2020-03-30 DIAGNOSIS — Z8249 Family history of ischemic heart disease and other diseases of the circulatory system: Secondary | ICD-10-CM | POA: Insufficient documentation

## 2020-03-30 HISTORY — PX: ATRIAL FIBRILLATION ABLATION: EP1191

## 2020-03-30 LAB — POCT ACTIVATED CLOTTING TIME
Activated Clotting Time: 257 seconds
Activated Clotting Time: 279 seconds
Activated Clotting Time: 312 seconds
Activated Clotting Time: 318 seconds

## 2020-03-30 SURGERY — ATRIAL FIBRILLATION ABLATION
Anesthesia: General

## 2020-03-30 MED ORDER — ACETAMINOPHEN 325 MG PO TABS
325.0000 mg | ORAL_TABLET | ORAL | Status: DC | PRN
  Filled 2020-03-30: qty 2

## 2020-03-30 MED ORDER — ONDANSETRON HCL 4 MG/2ML IJ SOLN: 4.0000 mg | Freq: Once | INTRAMUSCULAR | Status: DC | PRN

## 2020-03-30 MED ORDER — PROTAMINE SULFATE 10 MG/ML IV SOLN
INTRAVENOUS | Status: DC | PRN
  Administered 2020-03-30: 30 mg via INTRAVENOUS

## 2020-03-30 MED ORDER — ACETAMINOPHEN 325 MG PO TABS: 650.0000 mg | ORAL_TABLET | ORAL | Status: DC | PRN

## 2020-03-30 MED ORDER — SODIUM CHLORIDE 0.9% FLUSH: 3.0000 mL | Freq: Two times a day (BID) | INTRAVENOUS | Status: DC

## 2020-03-30 MED ORDER — SODIUM CHLORIDE 0.9 % IV SOLN: 250.0000 mL | INTRAVENOUS | Status: DC | PRN

## 2020-03-30 MED ORDER — DEXAMETHASONE SODIUM PHOSPHATE 10 MG/ML IJ SOLN
INTRAMUSCULAR | Status: DC | PRN
  Administered 2020-03-30: 10 mg via INTRAVENOUS

## 2020-03-30 MED ORDER — HEPARIN (PORCINE) IN NACL 1000-0.9 UT/500ML-% IV SOLN
INTRAVENOUS | Status: DC | PRN
  Administered 2020-03-30 (×4): 500 mL

## 2020-03-30 MED ORDER — FENTANYL CITRATE (PF) 100 MCG/2ML IJ SOLN
INTRAMUSCULAR | Status: DC | PRN
Start: 2020-03-30 — End: 2020-03-30
  Administered 2020-03-30: 100 ug via INTRAVENOUS

## 2020-03-30 MED ORDER — HEPARIN SODIUM (PORCINE) 1000 UNIT/ML IJ SOLN
INTRAMUSCULAR | Status: DC | PRN
  Administered 2020-03-30: 500 [IU] via INTRAVENOUS
  Administered 2020-03-30: 3000 [IU] via INTRAVENOUS
  Administered 2020-03-30: 18000 [IU] via INTRAVENOUS
  Administered 2020-03-30 (×2): 5000 [IU] via INTRAVENOUS

## 2020-03-30 MED ORDER — HYDROMORPHONE HCL 1 MG/ML IJ SOLN: 0.2500 mg | INTRAMUSCULAR | Status: DC | PRN

## 2020-03-30 MED ORDER — MIDAZOLAM HCL 5 MG/5ML IJ SOLN
INTRAMUSCULAR | Status: DC | PRN
  Administered 2020-03-30: 2 mg via INTRAVENOUS

## 2020-03-30 MED ORDER — HEPARIN (PORCINE) IN NACL 1000-0.9 UT/500ML-% IV SOLN
INTRAVENOUS | Status: AC
  Filled 2020-03-30: qty 500

## 2020-03-30 MED ORDER — HEPARIN SODIUM (PORCINE) 1000 UNIT/ML IJ SOLN
INTRAMUSCULAR | Status: DC | PRN
  Administered 2020-03-30: 1000 [IU] via INTRAVENOUS

## 2020-03-30 MED ORDER — RIVAROXABAN 20 MG PO TABS
20.0000 mg | ORAL_TABLET | Freq: Every day | ORAL | Status: DC
  Administered 2020-03-30: 20 mg via ORAL
  Filled 2020-03-30: qty 1

## 2020-03-30 MED ORDER — ONDANSETRON HCL 4 MG/2ML IJ SOLN: 4.0000 mg | Freq: Four times a day (QID) | INTRAMUSCULAR | Status: DC | PRN

## 2020-03-30 MED ORDER — ROCURONIUM BROMIDE 10 MG/ML (PF) SYRINGE
PREFILLED_SYRINGE | INTRAVENOUS | Status: DC | PRN
  Administered 2020-03-30: 60 mg via INTRAVENOUS
  Administered 2020-03-30: 20 mg via INTRAVENOUS

## 2020-03-30 MED ORDER — SODIUM CHLORIDE 0.9% FLUSH: 3.0000 mL | INTRAVENOUS | Status: DC | PRN

## 2020-03-30 MED ORDER — ACETAMINOPHEN 160 MG/5ML PO SOLN
325.0000 mg | ORAL | Status: DC | PRN
  Filled 2020-03-30: qty 20.3

## 2020-03-30 MED ORDER — ATORVASTATIN CALCIUM 40 MG PO TABS: 40.0000 mg | ORAL_TABLET | Freq: Every day | ORAL | 11 refills | Status: DC

## 2020-03-30 MED ORDER — LIDOCAINE 2% (20 MG/ML) 5 ML SYRINGE
INTRAMUSCULAR | Status: DC | PRN
  Administered 2020-03-30: 100 mg via INTRAVENOUS

## 2020-03-30 MED ORDER — PHENYLEPHRINE HCL-NACL 10-0.9 MG/250ML-% IV SOLN
INTRAVENOUS | Status: DC | PRN
  Administered 2020-03-30: 30 ug/min via INTRAVENOUS

## 2020-03-30 MED ORDER — PROPOFOL 10 MG/ML IV BOLUS
INTRAVENOUS | Status: DC | PRN
  Administered 2020-03-30: 200 mg via INTRAVENOUS

## 2020-03-30 MED ORDER — ONDANSETRON HCL 4 MG/2ML IJ SOLN
INTRAMUSCULAR | Status: DC | PRN
  Administered 2020-03-30: 4 mg via INTRAVENOUS

## 2020-03-30 MED ORDER — SODIUM CHLORIDE 0.9 % IV SOLN: INTRAVENOUS | Status: DC

## 2020-03-30 MED ORDER — PANTOPRAZOLE SODIUM 40 MG PO TBEC
40.0000 mg | DELAYED_RELEASE_TABLET | Freq: Every day | ORAL | Status: DC
  Administered 2020-03-30: 40 mg via ORAL
  Filled 2020-03-30: qty 1

## 2020-03-30 MED ORDER — HEPARIN SODIUM (PORCINE) 1000 UNIT/ML IJ SOLN
INTRAMUSCULAR | Status: AC
  Filled 2020-03-30: qty 1

## 2020-03-30 MED ORDER — PANTOPRAZOLE SODIUM 40 MG PO TBEC: 40.0000 mg | DELAYED_RELEASE_TABLET | Freq: Every day | ORAL | 0 refills | Status: DC

## 2020-03-30 SURGICAL SUPPLY — 22 items
BLANKET WARM UNDERBOD FULL ACC (MISCELLANEOUS) ×3 IMPLANT
CATH 8FR REPROCESSED SOUNDSTAR (CATHETERS) ×2 IMPLANT
CATH 8FR SOUNDSTAR REPROCESSED (CATHETERS) IMPLANT
CATH MAPPNG PENTARAY F 2-6-2MM (CATHETERS) IMPLANT
CATH S CIRCA THERM PROBE 10F (CATHETERS) ×1 IMPLANT
CATH SMTCH THERMOCOOL SF DF (CATHETERS) ×1 IMPLANT
CATH WEBSTER BI DIR CS D-F CRV (CATHETERS) ×1 IMPLANT
CLOSURE PERCLOSE PROSTYLE (VASCULAR PRODUCTS) ×1 IMPLANT
COVER SWIFTLINK CONNECTOR (BAG) ×2 IMPLANT
DEVICE CLOSURE PERCLS PRGLD 6F (VASCULAR PRODUCTS) IMPLANT
PACK EP LATEX FREE (CUSTOM PROCEDURE TRAY) ×2
PACK EP LF (CUSTOM PROCEDURE TRAY) ×1 IMPLANT
PAD PRO RADIOLUCENT 2001M-C (PAD) ×2 IMPLANT
PATCH CARTO3 (PAD) ×1 IMPLANT
PENTARAY F 2-6-2MM (CATHETERS) ×2
PERCLOSE PROGLIDE 6F (VASCULAR PRODUCTS) ×4
SHEATH BAYLIS TRANSSEPTAL 98CM (NEEDLE) ×1 IMPLANT
SHEATH CARTO VIZIGO SM CVD (SHEATH) ×1 IMPLANT
SHEATH PINNACLE 8F 10CM (SHEATH) ×2 IMPLANT
SHEATH PINNACLE 9F 10CM (SHEATH) ×1 IMPLANT
SHEATH PROBE COVER 6X72 (BAG) ×1 IMPLANT
TUBING SMART ABLATE COOLFLOW (TUBING) ×1 IMPLANT

## 2020-03-30 NOTE — Anesthesia Preprocedure Evaluation (Addendum)
Anesthesia Evaluation  Patient identified by MRN, date of birth, ID band Patient awake    Reviewed: Patient's Chart, lab work & pertinent test results, reviewed documented beta blocker date and time   Airway Mallampati: II  TM Distance: >3 FB Neck ROM: Full    Dental  (+) Teeth Intact   Pulmonary neg pulmonary ROS, former smoker,    Pulmonary exam normal        Cardiovascular hypertension, Pt. on home beta blockers + dysrhythmias Atrial Fibrillation  Rhythm:Irregular Rate:Normal     Neuro/Psych Anxiety negative neurological ROS     GI/Hepatic Neg liver ROS, hiatal hernia, GERD  Medicated,  Endo/Other  negative endocrine ROS  Renal/GU negative Renal ROS  negative genitourinary   Musculoskeletal  (+) Arthritis , Osteoarthritis,    Abdominal (+)  Abdomen: soft. Bowel sounds: normal.  Peds  Hematology negative hematology ROS (+)   Anesthesia Other Findings   Reproductive/Obstetrics                            Anesthesia Physical Anesthesia Plan  ASA: III  Anesthesia Plan: General   Post-op Pain Management:    Induction: Intravenous  PONV Risk Score and Plan: 2 and Ondansetron and Dexamethasone  Airway Management Planned: Mask and Oral ETT  Additional Equipment: None  Intra-op Plan:   Post-operative Plan: Extubation in OR  Informed Consent: I have reviewed the patients History and Physical, chart, labs and discussed the procedure including the risks, benefits and alternatives for the proposed anesthesia with the patient or authorized representative who has indicated his/her understanding and acceptance.     Dental advisory given  Plan Discussed with: CRNA  Anesthesia Plan Comments: (Lab Results      Component                Value               Date                      WBC                      7.9                 03/02/2020                HGB                      12.9 (L)             03/02/2020                HCT                      37.1 (L)            03/02/2020                MCV                      95                  03/02/2020                PLT                      219  03/02/2020           Covid-19 Nucleic Acid Test Results Lab Results      Component                Value               Date                      SARSCOV2NAA              NEGATIVE            03/28/2020                SARSCOV2NAA              NOT DETECTED        04/16/2019                SARSCOV2NAA              NOT DETECTED        04/08/2019                SARSCOV2NAA              NOT DETECTED        04/02/2019                SARSCOV2NAA              NEGATIVE            02/22/2019          )        Anesthesia Quick Evaluation

## 2020-03-30 NOTE — Anesthesia Postprocedure Evaluation (Signed)
Anesthesia Post Note  Patient: Ryan [name]  Procedure(s) Performed: ATRIAL FIBRILLATION ABLATION (N/A )     Patient location during evaluation: PACU Anesthesia Type: General Level of consciousness: awake and alert Pain management: pain level controlled Vital Signs Assessment: post-procedure vital signs reviewed and stable Respiratory status: spontaneous breathing, nonlabored ventilation, respiratory function stable and patient connected to nasal cannula oxygen Cardiovascular status: blood pressure returned to baseline and stable Postop Assessment: no apparent nausea or vomiting Anesthetic complications: no   No complications documented.  Last Vitals:  Vitals:   03/30/20 1345 03/30/20 1400  BP:  108/72  Pulse: 67 71  Resp: 12 16  Temp:    SpO2: 99% 98%    Last Pain:  Vitals:   03/30/20 1215  TempSrc:   PainSc: 0-No pain                 Earl Lites P Darryel Diodato

## 2020-03-30 NOTE — Anesthesia Procedure Notes (Signed)
Procedure Name: Intubation Date/Time: 03/30/2020 7:51 AM Performed by: Cleda Daub, CRNA Pre-anesthesia Checklist: Patient identified, Emergency Drugs available, Suction available and Patient being monitored Patient Re-evaluated:Patient Re-evaluated prior to induction Oxygen Delivery Method: Circle system utilized Preoxygenation: Pre-oxygenation with 100% oxygen Induction Type: IV induction Ventilation: Mask ventilation without difficulty Laryngoscope Size: Mac and 4 Grade View: Grade II Tube type: Oral Tube size: 7.5 mm Number of attempts: 1 Airway Equipment and Method: Stylet and Oral airway Placement Confirmation: ETT inserted through vocal cords under direct vision,  positive ETCO2 and breath sounds checked- equal and bilateral Tube secured with: Tape Dental Injury: Teeth and Oropharynx as per pre-operative assessment

## 2020-03-30 NOTE — Discharge Instructions (Signed)
Post procedure care instructions No driving for 4 days. No lifting over 5 lbs for 1 week. No vigorous or sexual activity for 1 week. You may return to work/your usual activities on 04/06/2020. Keep procedure site clean & dry. If you notice increased pain, swelling, bleeding or pus, call/return!  You may shower, but no soaking baths/hot tubs/pools for 1 week.     You have an appointment set up with the Atrial Fibrillation Clinic.  Multiple studies have shown that being followed by a dedicated atrial fibrillation clinic in addition to the standard care you receive from your other physicians improves health. We believe that enrollment in the atrial fibrillation clinic will allow [place] to better care for you.   The phone number to the Atrial Fibrillation Clinic is [phone]. The clinic is staffed Monday through Friday from 8:30am to 5pm.  Parking Directions: The clinic is located in the Heart and Vascular Building connected to [facility]. 1)From [address] turn on to [address] and go to the 3rd entrance  (Heart and Vascular entrance) on the right. 2)Look to the right for Heart &Vascular Parking Garage. 3)A code for the entrance is required, October is 3009 4)Take the elevators to the 1st floor. Registration is in the room with the glass walls at the end of the hallway.  If you have any trouble parking or locating the clinic, please dont hesitate to call [phone].   Cardiac Ablation, Care After  This sheet gives you information about how to care for yourself after your procedure. Your health care provider may also give you more specific instructions. If you have problems or questions, contact your health care provider. What can I expect after the procedure? After the procedure, it is common to have:  Bruising around your puncture site.  Tenderness around your puncture site.  Skipped heartbeats.  Tiredness (fatigue).  Follow these instructions at home: Puncture site  care   Follow instructions from your health care provider about how to take care of your puncture site. Make sure you: ? If present, leave stitches (sutures), skin glue, or adhesive strips in place. These skin closures may need to stay in place for up to 2 weeks. If adhesive strip edges start to loosen and curl up, you may trim the loose edges. Do not remove adhesive strips completely unless your health care provider tells you to do that. Remove bandages in 24 hours. Then you may shower.  Check your puncture site every day for signs of infection. Check for: ? Redness, swelling, or pain. ? Fluid or blood. If your puncture site starts to bleed, lie down on your back, apply firm pressure to the area, and contact your health care provider. ? Warmth. ? Pus or a bad smell. Driving  Do not drive for at least 4 days after your procedure or however long your health care provider recommends. (Do not resume driving if you have previously been instructed not to drive for other health reasons.)  Do not drive or use heavy machinery while taking prescription pain medicine. Activity  Avoid activities that take a lot of effort for at least 7 days after your procedure.  Do not lift anything that is heavier than 5 lb (4.5 kg) for one week.   No sexual activity for 1 week.   Return to your normal activities as told by your health care provider. Ask your health care provider what activities are safe for you. General instructions  Take over-the-counter and prescription medicines only as  told by your health care provider.  Do not use any products that contain nicotine or tobacco, such as cigarettes and e-cigarettes. If you need help quitting, ask your health care provider.  You may shower after 24 hours, but Do not take baths, swim, or use a hot tub for 1 week.   Do not drink alcohol for 24 hours after your procedure.  Keep all follow-up visits as told by your health care provider. This is  important. Contact a health care provider if:  You have redness, mild swelling, or pain around your puncture site.  You have fluid or blood coming from your puncture site that stops after applying firm pressure to the area.  Your puncture site feels warm to the touch.  You have pus or a bad smell coming from your puncture site.  You have a fever.  You have chest pain or discomfort that spreads to your neck, jaw, or arm.  You are sweating a lot.  You feel nauseous.  You have a fast or irregular heartbeat.  You have shortness of breath.  You are dizzy or light-headed and feel the need to lie down.  You have pain or numbness in the arm or leg closest to your puncture site. Get help right away if:  Your puncture site suddenly swells.  Your puncture site is bleeding and the bleeding does not stop after applying firm pressure to the area. These symptoms may represent a serious problem that is an emergency. Do not wait to see if the symptoms will go away. Get medical help right away. Call your local emergency services (911 in the [place] Do not drive yourself to the hospital. Summary  After the procedure, it is normal to have bruising and tenderness at the puncture site in your groin, neck, or forearm.  Check your puncture site every day for signs of infection.  Get help right away if your puncture site is bleeding and the bleeding does not stop after applying firm pressure to the area. This is a medical emergency. This information is not intended to replace advice given to you by your health care provider. Make sure you discuss any questions you have with your health care provider.

## 2020-03-30 NOTE — Transfer of Care (Signed)
Immediate Anesthesia Transfer of Care Note  Patient: Ryan [name]  Procedure(s) Performed: ATRIAL FIBRILLATION ABLATION (N/A )  Patient Location: PACU  Anesthesia Type:General  Level of Consciousness: awake, alert , oriented and patient cooperative  Airway & Oxygen Therapy: Patient Spontanous Breathing and Patient connected to nasal cannula oxygen  Post-op Assessment: Report given to RN and Post -op Vital signs reviewed and stable  Post vital signs: Reviewed and stable  Last Vitals:  Vitals Value Taken Time  BP 103/50 03/30/20 1055  Temp    Pulse 64 03/30/20 1054  Resp    SpO2 99 % 03/30/20 1054  Vitals shown include unvalidated device data.  Last Pain:  Vitals:   03/30/20 0559  TempSrc:   PainSc: 0-No pain         Complications: No complications documented.

## 2020-03-30 NOTE — H&P (Signed)
Electrophysiology Office Note:    Date:  02/28/2020   ID:  Ryan [name], DOB Jan 14, 1966, MRN [id number]  PCP:  [facility]     [facility] Cardiologist:  Parke Poisson, MD  [facility] Electrophysiologist:  Lanier Prude, MD   Referring MD: Danice Goltz, [place]   Chief Complaint: AF  History of Present Illness:    Ryan [name] is a 54 y.o. male with a hx of symptomatic AF diagnosed 07/2018 managed with rivaroxaban (previously on eliquis but had allergic reaction to the medication) and flecainide. He has previously been cardioverted but had ERAF. While in sinus rhythm he had more energy and less dyspneic. He then started flecainide and was cardioverted and stayed in sinus for 6 months before returning to AF. He can tell a difference in his exercise capacity when in AF.   He tells me that he has lost about 30 lbs this past year. He says that he changed his diet to achieve the weight loss, cutting out red meat and now he eats more vegetables, grains, chicken. 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 Has previously had a sleep study and had very mild OSA. He has a CPAP at home and goes to sleep with it on but frequently wakes up with the mask off. He continues to work on this.   Medical history includes ALS in his father. He is seeing a neurologist to confirm he does not have signs of ALS.      Past Medical History:  Diagnosis Date  . Anxiety   . Arthritis    oa  . Chest pain since 02-11-2013   pt thinks associated with reflux  . Dyspnea    with exertion  . GERD (gastroesophageal reflux disease)   . H/O hiatal hernia          Past Surgical History:  Procedure Laterality Date  . ANKLE SURGERY Right    reconstruction to muscle  . CARDIOVERSION N/A 02/25/2019   Procedure: CARDIOVERSION;  Surgeon: Little Ishikawa, MD;  Location[facility] ENDOSCOPY;  Service: Endoscopy;   Laterality: N/A;  . CARDIOVERSION N/A 04/12/2019   Procedure: CARDIOVERSION;  Surgeon: Lars Masson, MD;  Location: [facility] ENDOSCOPY;  Service: Cardiovascular;  Laterality: N/A;  . ESOPHAGEAL MANOMETRY N/A 04/17/2015   Procedure: ESOPHAGEAL MANOMETRY (EM);  Surgeon: Charna Elizabeth, MD;  Location: [facility] ENDOSCOPY;  Service: Endoscopy;  Laterality: N/A;  . ESOPHAGOGASTRODUODENOSCOPY  11/09/2003  . FOOT SURGERY     right  . HIP SURGERY  age 47   left, done x 2  . KNEE ARTHROSCOPY Left 10/20/2003 and 2012  . LEFT HEART CATH AND CORONARY ANGIOGRAPHY N/A 12/29/2018   Procedure: LEFT HEART CATH AND CORONARY ANGIOGRAPHY;  Surgeon: Lennette Bihari, MD;  Location: [facility] INVASIVE CV LAB;  Service: Cardiovascular;  Laterality: N/A;  . MYRINGOTOMY WITH TUBE PLACEMENT Right 08/29/2015   Procedure: MYRINGOTOMY WITH TUBE PLACEMENT;  Surgeon: Newman Pies, MD;  Location: [facility];  Service: ENT;  Laterality: Right;  . PH IMPEDANCE STUDY N/A 04/17/2015   Procedure: PH IMPEDANCE STUDY;  Surgeon: Charna Elizabeth, MD;  Location: [facility] ENDOSCOPY;  Service: Endoscopy;  Laterality: N/A;  . SEPTOPLASTY N/A 08/29/2015   Procedure: SEPTOPLASTY;  Surgeon: Newman Pies, MD;  Location: [facility];  Service: ENT;  Laterality: N/A;  . TOTAL HIP ARTHROPLASTY Left 02/23/2013   Procedure: LEFT TOTAL HIP  ARTHROPLASTY ANTERIOR APPROACH;  Surgeon: Shelda Pal, MD;  Location: [facility] ORS;  Service: Orthopedics;  Laterality: Left;  . TOTAL KNEE ARTHROPLASTY Right 11/11/2018   Procedure: TOTAL KNEE ARTHROPLASTY;  Surgeon: [name], MD;  Location: [facility] ORS;  Service: Orthopedics;  Laterality: Right;   . TURBINATE REDUCTION Bilateral 08/29/2015   Procedure: TURBINATE REDUCTION;  Surgeon: Newman Pies, MD;  Location: [facility];  Service: ENT;  Laterality: Bilateral;  . URETHRA SURGERY   35yrs ago   for scar tissue    Current Medications: Active Medications      Current Meds  Medication Sig   . atenolol (TENORMIN) 25 MG tablet Take 1 tablet (25 mg total) by mouth 2 (two) times daily.  . ciprofloxacin (CILOXAN) 0.3 % ophthalmic solution SMARTSIG:4 Drop(s) In Ear(s)  . flecainide (TAMBOCOR) 100 MG tablet Take 1 tablet (100 mg total) by mouth 2 (two) times daily.  Marland Kitchen gabapentin (NEURONTIN) 300 MG capsule Take 600 mg by mouth at bedtime.  . prednisoLONE acetate (PRED FORTE) 1 % ophthalmic suspension SMARTSIG:4 Drop(s) In Ear(s)       Allergies:   Eliquis [apixaban]   Social History        Socioeconomic History  . Marital status: Married    Spouse name: Not on file  . Number of children: Not on file  . Years of education: Not on file  . Highest education level: Not on file  Occupational History  . Not on file  Tobacco Use  . Smoking status: Former Smoker    Packs/day: 1.50    Years: 25.00    Pack years: 37.50    Types: Cigarettes    Quit date: 05/29/2017    Years since quitting: 2.7  . Smokeless tobacco: Never Used  . Tobacco comment: quit 2018  Vaping Use  . Vaping Use: Never used  Substance and Sexual Activity  . Alcohol use: Yes    Alcohol/week: 2.0 standard drinks    Types: 2 Standard drinks or equivalent per week    Comment: ocassionally  . Drug use: Not Currently    Types: Marijuana    Comment: occ marijuana last used 1 week ago  . Sexual activity: Not on file  Other Topics Concern  . Not on file  Social History Narrative  . Not on file   Social Determinants of Health      Financial Resource Strain:   . Difficulty of Paying Living Expenses: Not on file  Food Insecurity:   . Worried About Programme researcher, broadcasting/film/video in the Last Year: Not on file  . Ran Out of Food in the Last Year: Not on file  Transportation Needs:   . Lack of Transportation (Medical): Not on file  . Lack of Transportation (Non-Medical): Not on file  Physical Activity:   . Days of Exercise per Week: Not on file  . Minutes of Exercise per Session: Not on  file  Stress:   . Feeling of Stress : Not on file  Social Connections:   . Frequency of Communication with Friends and Family: Not on file  . Frequency of Social Gatherings with Friends and Family: Not on file  . Attends Religious Services: Not on file  . Active Member of Clubs or Organizations: Not on file  . Attends Banker Meetings: Not on file  . Marital Status: Not on file     Family History: The patient's family history includes Atrial fibrillation in his father.  ROS:  Please see the history of present illness.    All other systems reviewed and are negative.  EKGs/Labs/Other Studies Reviewed:    The following studies were reviewed today:  Echo 12/31/2018 1. The left ventricle has normal systolic function with an ejection fraction of 60-65%. The cavity size was normal. Left ventricular diastolic function could not be evaluated secondary to atrial fibrillation. 2. The right ventricle has normal systolic function. The cavity was normal. 3. The aortic valve is tricuspid. Mild thickening of the aortic valve. No stenosis of the aortic valve. 4. Pt in atrial fibrillation during the study; normal LV function; no significant valvular heart disease.  12/29/2018 Cath Essentially normal epicardial coronary arteries without obstructive disease. Normal global LV function with EF estimated 50%; LVEDP 13 mm.   EKG:  The ekg ordered today demonstrates atrial fibrillation.  Recent Labs: 04/07/2019: BUN 9; Creatinine, Ser 0.98; Hemoglobin 14.5; Platelets 246; Potassium 4.4; Sodium 140  Recent Lipid Panel Labs (Brief)  No results found for: CHOL, TRIG, HDL, CHOLHDL, VLDL, LDLCALC, LDLDIRECT    Physical Exam:    VS:  BP 126/80   Pulse 68   Ht 6\' 3"  (1.905 m)   Wt 226 lb 3.2 oz (102.6 kg)   SpO2 98%   BMI 28.27 kg/m        Wt Readings from Last 3 Encounters:  02/28/20 226 lb 3.2 oz (102.6 kg)  02/10/20 223 lb (101.2 kg)  12/20/19 228 lb 12.8 oz  (103.8 kg)     GEN:  Well nourished, well developed in no acute distress HEENT: Normal NECK: No JVD; No carotid bruits LYMPHATICS: No lymphadenopathy CARDIAC: irregularly irregular, no murmurs, rubs, gallops RESPIRATORY:  Clear to auscultation without rales, wheezing or rhonchi  ABDOMEN: Soft, non-tender, non-distended MUSCULOSKELETAL:  No edema; No deformity  SKIN: Warm and dry NEUROLOGIC:  Alert and oriented x 3 PSYCHIATRIC:  Normal affect   ASSESSMENT:    1. Persistent atrial fibrillation (HCC)    PLAN:    In order of problems listed above:  1. Persistent atrial fibrillation Tolerating rivaroxaban and flecainide. Recently increased flecainide dose to 100mg  BID. Echo shows normal size LA in 2020. Given his LA anatomy and symptomatic nature of his AF, I believe ablation is a good option for rhythm control for the patient.  Ablation strategy will be PVI, posterior wall isolation. EF normal on recent echo.  ------------------------------------------------------------------------------------------ I have seen and examined the patient 03/30/2020 and agree with the above H&P from 02/28/2020. Ryan [name] is presenting for AF ablation for his symptomatic persistent atrial fibrillation.   03/01/2020 T. Gilman Buttner, MD, [facility] Cardiac Electrophysiology

## 2020-03-30 NOTE — Progress Notes (Signed)
Discharge instructions reviewed with pt and his wife (via telephone) Both voice understanding.  

## 2020-03-31 ENCOUNTER — Encounter (HOSPITAL_COMMUNITY): Payer: Self-pay | Admitting: Cardiology

## 2020-04-01 ENCOUNTER — Telehealth: Payer: Self-pay | Admitting: Internal Medicine

## 2020-04-01 NOTE — Telephone Encounter (Signed)
Cardiology Moonlighter Note  Returned page from patient. Having some leg swelling. Skin is pink tinged. About 4 inches long and located inferior to groin access site. Area is soft and only mildly swollen.    I performed a video call with the patient and was able to see the area he was referring to (albeit with very poor video quality due to connection issues). The groin site did not appear to have a significant hematoma, although there was some mild ecchymosis inferior and medial to the catheter access site on the L groin. Access site clean, dry, intact. Patient is taking rivaroxaban.   I recommended the patient monitor the area to ensure there is no worsening of swelling. Watch for firmness to suggest hematoma. Also monitor for dizziness, lightheadedness, back pain, and other symptoms suggestive of active bleeding. Patient should come to ED if he develops any of these. He understands.   Will cc note to patient's EP.   Rosario Jacks, MD Cardiology Fellow, PGY-8

## 2020-04-17 ENCOUNTER — Ambulatory Visit: Payer: BLUE CROSS/BLUE SHIELD | Admitting: Diagnostic Neuroimaging

## 2020-04-17 ENCOUNTER — Encounter: Payer: Self-pay | Admitting: Diagnostic Neuroimaging

## 2020-04-17 ENCOUNTER — Telehealth: Payer: Self-pay | Admitting: *Deleted

## 2020-04-17 NOTE — Telephone Encounter (Signed)
Patient was no show for new patient appointment today. 

## 2020-04-27 ENCOUNTER — Ambulatory Visit (HOSPITAL_COMMUNITY)
Admission: RE | Admit: 2020-04-27 | Discharge: 2020-04-27 | Disposition: A | Discharge status: Home / Self Care | Payer: BC Managed Care – PPO | Source: Ambulatory Visit | Attending: Physician Assistant | Admitting: Physician Assistant

## 2020-04-27 ENCOUNTER — Encounter (HOSPITAL_COMMUNITY): Payer: Self-pay | Admitting: Physician Assistant

## 2020-04-27 ENCOUNTER — Other Ambulatory Visit: Payer: Self-pay

## 2020-04-27 VITALS — BP 108/66 | HR 49 | Ht 75.0 in | Wt 220.4 lb

## 2020-04-27 DIAGNOSIS — I4819 Other persistent atrial fibrillation: Secondary | ICD-10-CM | POA: Insufficient documentation

## 2020-04-27 DIAGNOSIS — Z87891 Personal history of nicotine dependence: Secondary | ICD-10-CM | POA: Insufficient documentation

## 2020-04-27 DIAGNOSIS — Z7901 Long term (current) use of anticoagulants: Secondary | ICD-10-CM | POA: Insufficient documentation

## 2020-04-27 DIAGNOSIS — G4733 Obstructive sleep apnea (adult) (pediatric): Secondary | ICD-10-CM | POA: Diagnosis not present

## 2020-04-27 DIAGNOSIS — Z79899 Other long term (current) drug therapy: Secondary | ICD-10-CM | POA: Insufficient documentation

## 2020-04-27 MED ORDER — ATENOLOL 25 MG PO TABS
25.0000 mg | ORAL_TABLET | Freq: Every day | ORAL | 6 refills | Status: DC
Start: 2020-04-27 — End: 2020-07-31

## 2020-04-27 NOTE — Progress Notes (Signed)
Primary Care Physician: [facility] Cardiologist: Dr Jacques Navy Primary Electrophysiologist: Dr Lalla Brothers Referring Physician: Corine Shelter PA-C   Ryan [name] is a 54 y.o. male with a history of persistent atrial fibrillation and OSA who presents for follow up in the [facility].  The patient was initially diagnosed with atrial fibrillation 07/2018 at an ED visit but apparently went unnoticed until a preop appt for knee replacement in 10/2018. He saw Dr Jacques Navy 12/23/2018 and because of his history of chest pain was set up for OP cath. This was done 12/29/2018 and showed normal coronaries. Eliquis was added and the plan was for him to have OP DCCV. Echo 12/31/2018 showed normal LVF with an EF of 60-65% and normal LA size. He had an allergic reaction to Eliquis and was changed to Xarelto. He ultimatley underwent OP DCCV 02/25/2019. Unfortunately, patient had ERAF on follow up after DCCV. He states that immediately after his DCCV he felt better with more energy and less SOB. Of note, he had been on prednisone for his knee pain. He denies any significant alcohol use. He does have diagnosis of OSA and has used CPAP intermittently. Patient is s/p DCCV on 04/12/19. Patient reports that he missed several doses of his flecainide starting the weekend of 11/27/19 was back in afib with symptoms of fatigue. He is not compliant with his CPAP.  Patient reports that he went back into afib on 01/20/20. There were no specific triggers he could identify. He denies alcohol use or missed doses of flecainide. He started taking Xarelto again on that same day.   On follow up today, patient is s/p afib ablation with Dr Lalla Brothers 03/30/20. He has done very well post procedure with no symptoms of afib. He feels like he has much more energy. He denies any new CP, swallowing, or groin issues. He has chronic atypical CP and has had extensive workup in the past. He denies any  bleeding issues on anticoagulation.   Today, he denies symptoms of orthopnea, PND, lower extremity edema, dizziness, presyncope, syncope,bleeding, or neurologic sequela. The patient is tolerating medications without difficulties and is otherwise without complaint today.   Atrial Fibrillation Risk Factors:  he does have symptoms or diagnosis of sleep apnea. he is not compliant with CPAP therapy. he does not have a history of rheumatic fever. he does not have a history of alcohol use. The patient does have a history of early familial atrial fibrillation or other arrhythmias. Father had afib.  he has a BMI of Body mass index is 27.55 kg/m.Marland Kitchen Filed Weights   04/27/20 1329  Weight: 100 kg    Family History  Problem Relation Age of Onset   Atrial fibrillation Father      Atrial Fibrillation Management history:  Previous antiarrhythmic drugs: flecainide Previous cardioversions: 02/25/19, 04/12/19 Previous ablations: 03/30/20 CHADS2VASC score: 0 Anticoagulation history: Eliquis (stopped 2/2 allergic reaction), Xarelto   Past Medical History:  Diagnosis Date   Anxiety    Arthritis    oa   Chest pain since 02-11-2013   pt thinks associated with reflux   Dyspnea    with exertion   GERD (gastroesophageal reflux disease)    H/O hiatal hernia    Past Surgical History:  Procedure Laterality Date   ANKLE SURGERY Right    reconstruction to muscle   ATRIAL FIBRILLATION ABLATION N/A 03/30/2020   Procedure: ATRIAL FIBRILLATION ABLATION;  Surgeon: Lanier Prude, MD;  Location: [facility] INVASIVE CV  LAB;  Service: Cardiovascular;  Laterality: N/A;   CARDIOVERSION N/A 02/25/2019   Procedure: CARDIOVERSION;  Surgeon: Little Ishikawa, MD;  Location: [facility] ENDOSCOPY;  Service: Endoscopy;  Laterality: N/A;   CARDIOVERSION N/A 04/12/2019   Procedure: CARDIOVERSION;  Surgeon: Lars Masson, MD;  Location: [facility] ENDOSCOPY;  Service: Cardiovascular;  Laterality: N/A;   ESOPHAGEAL  MANOMETRY N/A 04/17/2015   Procedure: ESOPHAGEAL MANOMETRY (EM);  Surgeon: Charna Elizabeth, MD;  Location: [facility] ENDOSCOPY;  Service: Endoscopy;  Laterality: N/A;   ESOPHAGOGASTRODUODENOSCOPY  11/09/2003   FOOT SURGERY     right   HIP SURGERY  age 65   left, done x 2   KNEE ARTHROSCOPY Left 10/20/2003 and 2012   LEFT HEART CATH AND CORONARY ANGIOGRAPHY N/A 12/29/2018   Procedure: LEFT HEART CATH AND CORONARY ANGIOGRAPHY;  Surgeon: Lennette Bihari, MD;  Location: [facility] INVASIVE CV LAB;  Service: Cardiovascular;  Laterality: N/A;   MYRINGOTOMY WITH TUBE PLACEMENT Right 08/29/2015   Procedure: MYRINGOTOMY WITH TUBE PLACEMENT;  Surgeon: Newman Pies, MD;  Location: [facility];  Service: ENT;  Laterality: Right;   PH IMPEDANCE STUDY N/A 04/17/2015   Procedure: PH IMPEDANCE STUDY;  Surgeon: Charna Elizabeth, MD;  Location: [facility] ENDOSCOPY;  Service: Endoscopy;  Laterality: N/A;   SEPTOPLASTY N/A 08/29/2015   Procedure: SEPTOPLASTY;  Surgeon: Newman Pies, MD;  Location: [facility];  Service: ENT;  Laterality: N/A;   TOTAL HIP ARTHROPLASTY Left 02/23/2013   Procedure: LEFT TOTAL HIP ARTHROPLASTY ANTERIOR APPROACH;  Surgeon: Shelda Pal, MD;  Location: [facility] ORS;  Service: Orthopedics;  Laterality: Left;   TOTAL KNEE ARTHROPLASTY Right 11/11/2018   Procedure: TOTAL KNEE ARTHROPLASTY;  Surgeon: [name], MD;  Location: [facility] ORS;  Service: Orthopedics;  Laterality: Right;    TURBINATE REDUCTION Bilateral 08/29/2015   Procedure: TURBINATE REDUCTION;  Surgeon: Newman Pies, MD;  Location: [facility];  Service: ENT;  Laterality: Bilateral;   URETHRA SURGERY   56yrs ago   for scar tissue    Current Outpatient Medications  Medication Sig Dispense Refill   atenolol (TENORMIN) 25 MG tablet Take 1 tablet (25 mg total) by mouth 2 (two) times daily. 60 tablet 6   atorvastatin (LIPITOR) 40 MG tablet Take 1 tablet (40 mg total) by mouth daily. 30 tablet 11   flecainide (TAMBOCOR)  100 MG tablet Take 1 tablet (100 mg total) by mouth 2 (two) times daily. 60 tablet 3   gabapentin (NEURONTIN) 300 MG capsule Take 600 mg by mouth at bedtime.     Omega-3 Fatty Acids (FISH OIL PO) Take 1 capsule by mouth daily.     rivaroxaban (XARELTO) 20 MG TABS tablet Take 1 tablet (20 mg total) by mouth daily with supper. 30 tablet 0   terbinafine (LAMISIL) 250 MG tablet Take 250 mg by mouth daily.     triamcinolone cream (KENALOG) 0.1 % Apply 1 application topically in the morning and at bedtime.     hydrOXYzine (ATARAX/VISTARIL) 10 MG tablet Take 10 mg by mouth daily with supper. (Patient not taking: Reported on 04/27/2020)     pantoprazole (PROTONIX) 40 MG tablet Take 1 tablet (40 mg total) by mouth daily. (Patient not taking: Reported on 04/27/2020) 45 tablet 0   No current facility-administered medications for this encounter.    No Known Allergies  Social History   Socioeconomic History   Marital status: Married    Spouse name: Not on file   Number of children: Not on file  Years of education: Not on file   Highest education level: Not on file  Occupational History   Not on file  Tobacco Use   Smoking status: Former Smoker    Packs/day: 1.50    Years: 25.00    Pack years: 37.50    Types: Cigarettes    Quit date: 05/29/2017    Years since quitting: 2.9   Smokeless tobacco: Never Used   Tobacco comment: quit 2018  Vaping Use   Vaping Use: Never used  Substance and Sexual Activity   Alcohol use: Yes    Alcohol/week: 2.0 standard drinks    Types: 2 Standard drinks or equivalent per week    Comment: ocassionally   Drug use: Not Currently    Types: Marijuana    Comment: occ marijuana last used 1 week ago   Sexual activity: Not on file  Other Topics Concern   Not on file  Social History Narrative   Not on file   Social Determinants of Health   Financial Resource Strain:    Difficulty of Paying Living Expenses: Not on file  Food Insecurity:     Worried About Programme researcher, broadcasting/film/video in the Last Year: Not on file   [employer] of Food in the Last Year: Not on file  Transportation Needs:    Lack of Transportation (Medical): Not on file   Lack of Transportation (Non-Medical): Not on file  Physical Activity:    Days of Exercise per Week: Not on file   Minutes of Exercise per Session: Not on file  Stress:    Feeling of Stress : Not on file  Social Connections:    Frequency of Communication with Friends and Family: Not on file   Frequency of Social Gatherings with Friends and Family: Not on file   Attends Religious Services: Not on file   Active Member of Clubs or Organizations: Not on file   Attends Banker Meetings: Not on file   Marital Status: Not on file  Intimate Partner Violence:    Fear of Current or Ex-Partner: Not on file   Emotionally Abused: Not on file   Physically Abused: Not on file   Sexually Abused: Not on file     ROS- All systems are reviewed and negative except as per the HPI above.  Physical Exam: Vitals:   04/27/20 1329  BP: 108/66  Pulse: (!) 49  Weight: 100 kg  Height: 6\' 3"  (1.905 m)    GEN- The patient is well appearing, alert and oriented x 3 today.   HEENT-head normocephalic, atraumatic, sclera clear, conjunctiva pink, hearing intact, trachea midline. Lungs- Clear to ausculation bilaterally, normal work of breathing Heart- Regular rate and rhythm, bradycardia, no murmurs, rubs or gallops  GI- soft, NT, ND, + BS Extremities- no clubbing, cyanosis, or edema MS- no significant deformity or atrophy Skin- no rash or lesion Psych- euthymic mood, full affect Neuro- strength and sensation are intact   Wt Readings from Last 3 Encounters:  04/27/20 100 kg  03/30/20 104.3 kg  02/28/20 102.6 kg    EKG today demonstrates SB HR 49, PR 176, QRS 88, QTc 381  Echo 12/31/18 demonstrated  1. The left ventricle has normal systolic function with an ejection fraction of 60-65%.  The cavity size was normal. Left ventricular diastolic function could not be evaluated secondary to atrial fibrillation.  2. The right ventricle has normal systolic function. The cavity was normal.  3. The aortic valve is tricuspid. Mild thickening of  the aortic valve. No stenosis of the aortic valve.  4. Pt in atrial fibrillation during the study; normal LV function; no significant valvular heart disease.  Epic records are reviewed at length today  Assessment and Plan:  1. Persistent atrial fibrillation S/p afib ablation 03/30/20 Patient appears to be maintaining SR. Continue Xarelto 20 mg daily with no missed doses for at least 3 months post ablation.  Continue flecainide 100 mg BID for now Decrease atenolol to 25 mg daily given bradycardia.  Kardia for home monitoring   This patients CHA2DS2-VASc Score and unadjusted Ischemic Stroke Rate (% per year) is equal to 0.2 % stroke rate/year from a score of 0  Above score calculated as 1 point each if present [CHF, HTN, DM, Vascular=MI/PAD/Aortic Plaque, Age if 65-74, or Male] Above score calculated as 2 points each if present [Age > 75, or Stroke/TIA/TE]  2. OSA Encouraged compliance with CPAP therapy.    Follow up with Dr Lalla BrothersLambert as scheduled.    Jorja Loaicky Lakendra Helling PA-C [facility] [address] [place], [place] [id number] [phone] 04/27/2020 1:44 PM

## 2020-06-28 ENCOUNTER — Other Ambulatory Visit: Payer: Self-pay

## 2020-06-28 ENCOUNTER — Ambulatory Visit: Payer: BC Managed Care – PPO | Admitting: Neurology

## 2020-06-28 ENCOUNTER — Encounter: Payer: Self-pay | Admitting: Neurology

## 2020-06-28 VITALS — BP 99/67 | HR 49 | Ht 75.0 in | Wt 220.0 lb

## 2020-06-28 DIAGNOSIS — M47892 Other spondylosis, cervical region: Secondary | ICD-10-CM

## 2020-06-28 DIAGNOSIS — G8929 Other chronic pain: Secondary | ICD-10-CM

## 2020-06-28 DIAGNOSIS — R269 Unspecified abnormalities of gait and mobility: Secondary | ICD-10-CM

## 2020-06-28 DIAGNOSIS — M25561 Pain in right knee: Secondary | ICD-10-CM | POA: Diagnosis not present

## 2020-06-28 DIAGNOSIS — R208 Other disturbances of skin sensation: Secondary | ICD-10-CM

## 2020-06-28 DIAGNOSIS — R29898 Other symptoms and signs involving the musculoskeletal system: Secondary | ICD-10-CM

## 2020-06-28 DIAGNOSIS — M25562 Pain in left knee: Secondary | ICD-10-CM

## 2020-06-28 DIAGNOSIS — M545 Low back pain, unspecified: Secondary | ICD-10-CM

## 2020-06-28 DIAGNOSIS — M47896 Other spondylosis, lumbar region: Secondary | ICD-10-CM

## 2020-06-28 NOTE — Patient Instructions (Addendum)
I think you have a more complex problem affecting your leg strength and pain in the legs around the knees.  Contributors include deconditioning, prior multiple surgeries and chronic low back pain with degenerative changes in the lumbar spine.  I recommend that you make a follow-up appointment with your orthopedic specialist to reevaluate your lumbar spine with another MRI for comparison.  In addition, please talk to them about doing physical therapy for strengthening of your lower extremities.  From my end of things, I suggest we proceed with blood work to look for inflammatory markers, muscle enzymes, autoimmune markers.  We will do an EMG and nerve conduction velocity test, which is an electrical nerve and muscle test, which we will schedule. We will call you with the results and proceed from there.

## 2020-06-28 NOTE — Progress Notes (Signed)
Subjective:    Patient ID: Ryan [name] is a 54 y.o. male.  HPI     Ryan Foley, MD, PhD [facility] [address] [address] [place], [place] [address]  Dear Ryan [name],   I saw your patient, Ryan [name], upon your kind request, in my neurologic clinic today for initial consultation of his weakness patient is unaccompanied today.  As you know, Ryan [name] is a 54 year old right-handed gentleman with an underlying complex medical history of arthritis, status post left hip replacement, status post right knee replacement, status post right ankle and right foot surgeries, atrial fibrillation with status post cardioversion and ablation, overweight state, obstructive sleep apnea, reflux disease and hiatal hernia, chest pain, degenerative arthritis of the lumbar and cervical spine, and anxiety, who reports a 1 year history of weakness in both legs as well as severe pain around both knees, he has a burning sensation often.  He has the worst pain when he bends his knees.  He has a several year history of lower back pain and had an MRI through his orthopedic surgeon some for 4 or 5 years ago but that he had to have his left hip replaced.  He has right-sided low back pain.  He was explained at the time that he has significant degenerative changes, not so much with disc herniation as opposed to bony changes and also a unequal hip and pelvis height which contributed to pain.  He has not been back to his orthopedic specialist for his back pain.  He denies any recent falls.  He reports that within the past 6 to 8 months and particularly when he was homebound during the pandemic initially, after his right knee surgery, he was very immobile, and lost muscle strength particularly in his thigh muscles bilaterally.  He has neck pain as well.  He was told that he has degenerative neck disease as well.  His father had ALS, started with symptoms in his late 67s.  I reviewed your office note  from 02/10/2020. He also has foot pain, had a significant right ankle injury and needed surgery for this and had subsequent removal of a bone of the right foot later on.  He also has a bunion which is troublesome.  He has been on gabapentin per PCP.  It was originally started by his orthopedic doctor for his knee pain.  He takes 600 mg at bedtime and it helps him sleep.  His Past Medical History Is Significant For: Past Medical History:  Diagnosis Date  . Anxiety   . Arthritis    oa  . Chest pain since 02-11-2013   pt thinks associated with reflux  . Dyspnea    with exertion  . GERD (gastroesophageal reflux disease)   . H/O hiatal hernia     His Past Surgical History Is Significant For: Past Surgical History:  Procedure Laterality Date  . ANKLE SURGERY Right    reconstruction to muscle  . ATRIAL FIBRILLATION ABLATION N/A 03/30/2020   Procedure: ATRIAL FIBRILLATION ABLATION;  Surgeon: Ryan Prude, MD;  Location: [facility]IVE [facility];  Service: Cardiovascular;  Laterality: N/A;  . CARDIOVERSION N/A 02/25/2019   Procedure: CARDIOVERSION;  Surgeon: Ryan Ishikawa, MD;  Location: [facility] ENDOSCOPY;  Service: Endoscopy;  Laterality: N/A;  . CARDIOVERSION N/A 04/12/2019   Procedure: CARDIOVERSION;  Surgeon: Ryan Masson, MD;  Location: [facility] ENDOSCOPY;  Service: Cardiovascular;  Laterality: N/A;  . ESOPHAGEAL MANOMETRY N/A 04/17/2015   Procedure: ESOPHAGEAL MANOMETRY (  EM);  Surgeon: Ryan Elizabeth, MD;  Location: [facility] ENDOSCOPY;  Service: Endoscopy;  Laterality: N/A;  . ESOPHAGOGASTRODUODENOSCOPY  11/09/2003  . FOOT SURGERY     right  . HIP SURGERY  age 89   left, done x 2  . KNEE ARTHROSCOPY Left 10/20/2003 and 2012  . LEFT HEART CATH AND CORONARY ANGIOGRAPHY N/A 12/29/2018   Procedure: LEFT HEART CATH AND CORONARY ANGIOGRAPHY;  Surgeon: Ryan Bihari, MD;  Location: [facility] CV [facility];  Service: Cardiovascular;  Laterality: N/A;  . MYRINGOTOMY WITH TUBE PLACEMENT Right 08/29/2015    Procedure: MYRINGOTOMY WITH TUBE PLACEMENT;  Surgeon: Ryan Pies, MD;  Location: [facility];  Service: ENT;  Laterality: Right;  . PH IMPEDANCE STUDY N/A 04/17/2015   Procedure: PH IMPEDANCE STUDY;  Surgeon: Ryan Elizabeth, MD;  Location: [facility] ENDOSCOPY;  Service: Endoscopy;  Laterality: N/A;  . SEPTOPLASTY N/A 08/29/2015   Procedure: SEPTOPLASTY;  Surgeon: Ryan Pies, MD;  Location: [facility];  Service: ENT;  Laterality: N/A;  . TOTAL HIP ARTHROPLASTY Left 02/23/2013   Procedure: LEFT TOTAL HIP ARTHROPLASTY ANTERIOR APPROACH;  Surgeon: Ryan Pal, MD;  Location: [facility] ORS;  Service: Orthopedics;  Laterality: Left;  . TOTAL KNEE ARTHROPLASTY Right 11/11/2018   Procedure: TOTAL KNEE ARTHROPLASTY;  Surgeon: Ryan Gosselin, MD;  Location: [facility] OR[facility];  Service: Orthopedics;  Laterality: Right;   . TURBINATE REDUCTION Bilateral 08/29/2015   Procedure: TURBINATE REDUCTION;  Surgeon: Ryan Pies, MD;  Location: [facility];  Service: ENT;  Laterality: Bilateral;  . URETHRA SURGERY   47yrs ago   for scar tissue    His Family History Is Significant For: Family History  Problem Relation Age of Onset  . Atrial fibrillation Father     His Social History Is Significant For: Social History   Socioeconomic History  . Marital status: Married    Spouse name: Not on file  . Number of children: Not on file  . Years of education: Not on file  . Highest education level: Not on file  Occupational History  . Not on file  Tobacco Use  . Smoking status: Former Smoker    Packs/day: 1.50    Years: 25.00    Pack years: 37.50    Types: Cigarettes    Quit date: 05/29/2017    Years since quitting: 3.0  . Smokeless tobacco: Never Used  . Tobacco comment: quit 2018  Vaping Use  . Vaping Use: Never used  Substance and Sexual Activity  . Alcohol use: Yes    Alcohol/week: 2.0 standard drinks    Types: 2 Standard drinks or equivalent per week    Comment: ocassionally  . Drug  use: Not Currently    Types: Marijuana    Comment: occ marijuana last used 1 week ago  . Sexual activity: Not on file  Other Topics Concern  . Not on file  Social History Narrative  . Not on file   Social Determinants of Health   Financial Resource Strain: Not on file  Food Insecurity: Not on file  Transportation Needs: Not on file  Physical Activity: Not on file  Stress: Not on file  Social Connections: Not on file    His Allergies Are:  No Known Allergies:   His Current Medications Are:  Outpatient Encounter Medications as of 06/28/2020  Medication Sig  . atenolol (TENORMIN) 25 MG tablet Take 1 tablet (25 mg total) by mouth daily.  Marland Kitchen atorvastatin (LIPITOR) 40 MG tablet Take 1  tablet (40 mg total) by mouth daily.  . flecainide (TAMBOCOR) 100 MG tablet Take 1 tablet (100 mg total) by mouth 2 (two) times daily.  Marland Kitchen gabapentin (NEURONTIN) 300 MG capsule Take 600 mg by mouth at bedtime.  . Omega-3 Fatty Acids (FISH OIL PO) Take 1 capsule by mouth daily.  . rivaroxaban (XARELTO) 20 MG TABS tablet Take 20 mg by mouth daily with supper.  . [DISCONTINUED] hydrOXYzine (ATARAX/VISTARIL) 10 MG tablet Take 10 mg by mouth daily with supper.  . [DISCONTINUED] terbinafine (LAMISIL) 250 MG tablet Take 250 mg by mouth daily.  . [DISCONTINUED] triamcinolone cream (KENALOG) 0.1 % Apply 1 application topically in the morning and at bedtime.  . pantoprazole (PROTONIX) 40 MG tablet Take 1 tablet (40 mg total) by mouth daily. (Patient not taking: Reported on 04/27/2020)  . [DISCONTINUED] rivaroxaban (XARELTO) 20 MG TABS tablet Take 1 tablet (20 mg total) by mouth daily with supper.   No facility-administered encounter medications on file as of 06/28/2020.  :   Review of Systems:  Out of a complete 14 point review of systems, all are reviewed and negative with the exception of these symptoms as listed below:  Review of Systems  Neurological:       Patient reports that he has a burning feeling  down the side of each leg. He feels like his legs are going to give out on him and he has severe lower back pain. He has had numerous surgeries from the hips down.   Patient reports that his father had ALS.     Objective:  Neurological Exam  Physical Exam Physical Examination:   Vitals:   06/28/20 1441  BP: 99/67  Pulse: (!) 49   General Examination: The patient is a very pleasant 54 y.o. male in no acute distress. He appears well-developed and well-nourished and in work clothes, grease stains on hands.   HEENT: Normocephalic, atraumatic, pupils are equal, round and reactive to light and accommodation.  No facial weakness noted, no ptosis.  Extraocular tracking is well preserved, hearing grossly intact, face is symmetric with normal facial animation and normal sensation.  Speech is clear without dysarthria or hypophonia or voice tremor.  Airway examination reveals mild mouth dryness, tongue protrudes centrally and palate elevates symmetrically.  Moderate airway crowding noted.  Chest: Clear to auscultation without wheezing, rhonchi or crackles noted.  Heart: S1+S2+0, regular and normal without murmurs, rubs or gallops noted.   Abdomen: Soft, non-tender and non-distended with normal bowel sounds appreciated on auscultation.  Extremities: There is no pitting edema in the distal lower extremities bilaterally. Pedal pulses are intact.  Skin: Warm and dry without trophic changes noted.  Musculoskeletal: exam reveals right knee scar from knee replacement, left knee scars from arthroscopic surgeries.  Right knee larger in caliber compared to left.  He has bunions bilaterally.   Neurologically:  Mental status: The patient is awake, alert and oriented in all 4 spheres. His immediate and remote memory, attention, language skills and fund of knowledge are appropriate. There is no evidence of aphasia, agnosia, apraxia or anomia. Speech is clear with normal prosody and enunciation. Thought  process is linear. Mood is normal and affect is normal.  Cranial nerves II - XII are as described above under HEENT exam. In addition: shoulder shrug is normal with equal shoulder height noted. Motor exam: Normal bulk, strength and tone is noted in the upper extremities.  He does have more slender thigh muscle bulk bilaterally.  No obvious fasciculations or  focal atrophy noted.  He has fairly good strength in the lower extremities as well including hip flexors.  Reflexes are 1-2+ throughout with the exception of absent reflex in the right knee.  Preserved ankle reflexes, toes are downgoing bilaterally.  Romberg is negative.  Sensory exam is intact to light touch, pinprick, temperature and vibration with the exception of decreased pinprick sensation in the medial aspect of the distal legs below the knees bilaterally.  Also decreased sensation around the right knee around the scar.  In the lateral aspect of the distal lower extremities there is preserved sensation.  Cerebellar testing: No dysmetria or intention tremor on finger to nose testing. Heel to shin is unremarkable bilaterally. There is no truncal or gait ataxia. Gait, station and balance: He stands with difficulty.  He has to push himself up.  He reports significant pain in both knees whilst trying to stand and also exacerbation of his low back pain with standing.  While standing, posture is slightly stooped forward in the lumbar spine area with a increase in lumbar kyphosis.  He walks without a walking aid.  He walks with a slight limp on the right side.  Preserved arm swing, no shuffling.   Assessment and Plan:   In summary, Ryan [name] is a very pleasant 54 y.o.-year old male with an underlying complex medical history of arthritis, status post left hip replacement, status post right knee replacement, status post right ankle and right foot surgeries, atrial fibrillation with status post cardioversion and ablation, overweight state, obstructive  sleep apnea, reflux disease and hiatal hernia, chest pain, degenerative arthritis of the lumbar and cervical spine, and anxiety, who presents for evaluation of his lower extremity weakness of at least 1 years duration.  He also reports significant burning sensation around both knees, particularly around the front of the knee bilaterally and also on the sides of the knees.  The patient has a complicated orthopedic history, had right knee replacement last year but significant deconditioning, has a history of left hip surgeries twice as a 54-year-old and eventually hip replacement at age 54 on the left.  He had right ankle and foot surgeries.  He feels weaker in his thigh muscles and has significant pain around both knees with burning sensation around both knees.  He has lost weight within the past 6 to 8 months in the realm of 30 pounds, he worries about ALS, as his father had ALS.  No obvious focal atrophy or fasciculations on examination, he has preserved reflexes, no obvious upper motor neuron signs on exam.  Strength examination is quite good as well.  He has trouble standing up from the seated position with significant pain in both knees initially but once he gets moving it is better.  He reports significant low back pain for years. He has mostly right-sided low back pain, had orthopedics do an MRI lumbar spine some 4 years ago and was told he had significant degenerative changes. I think this is a complex and multifactorial issue regarding his weakness and gait problems as well as pain sensation.  Contributors include prior surgeries to both knees and left hip, changes in his leg length, low back pain with history of degenerative changes in the cervical and lumbar spine and deconditioning.  I had a long discussion with the patient today.  I suggested blood work to look for inflammatory markers, autoimmune markers, muscle enzymes.  We will also proceed with an EMG nerve conduction velocity test of the lower  extremities to look for signs of peripheral neuropathy versus radiculopathy versus myopathy.   He is encouraged to make a follow-up appointment with his orthopedic surgeon to discuss evaluation of his lumbar spine again.  He may benefit from a repeat lumbar spine MRI and comparison with prior studies.  He is encouraged to talk to orthopedics about strengthening exercises through physical therapy.  From our end of things, we will keep him posted as to his test results by phone call and proceed from there.  I answered all his questions today and he was in agreement. Thank you very much for allowing me to participate in the care of this nice patient. If I can be of any further assistance to you please do not hesitate to call me at [phone].  Sincerely,   Ryan Foley, MD, PhD

## 2020-06-29 ENCOUNTER — Telehealth: Payer: Self-pay

## 2020-06-29 DIAGNOSIS — M1990 Unspecified osteoarthritis, unspecified site: Secondary | ICD-10-CM

## 2020-06-29 DIAGNOSIS — M545 Low back pain, unspecified: Secondary | ICD-10-CM

## 2020-06-29 DIAGNOSIS — R768 Other specified abnormal immunological findings in serum: Secondary | ICD-10-CM

## 2020-06-29 DIAGNOSIS — M25561 Pain in right knee: Secondary | ICD-10-CM

## 2020-06-29 NOTE — Telephone Encounter (Signed)
Huston Foley, MD  06/29/2020 12:11 PM EST Back to Top     Several test results are still pending but autoimmune marker called rheumatoid factor was elevated, it can be seen in patients with rheumatoid arthritis. Given his significant arthritis issues, he may benefit from seeing a rheumatologist, I would like to await additional test results but will likely recommend a consultation with rheumatology. Please update patient.     Pt verified by name and DOB, results given per provider, pt voiced understanding.  Pt states he would like MD to send referral to a Rheumatologist in [place].

## 2020-06-29 NOTE — Telephone Encounter (Signed)
Referral requested to rheumatology.

## 2020-06-29 NOTE — Telephone Encounter (Signed)
Noted I have sent referral via Epic to Rheumatology

## 2020-06-29 NOTE — Progress Notes (Signed)
Several test results are still pending but autoimmune marker called rheumatoid factor was elevated, it can be seen in patients with rheumatoid arthritis.  Given his significant arthritis issues, he may benefit from seeing a rheumatologist, I would like to await additional test results but will likely recommend a consultation with rheumatology.  Please update patient.

## 2020-07-03 ENCOUNTER — Telehealth: Payer: Self-pay

## 2020-07-03 NOTE — Telephone Encounter (Signed)
-----   Message from Ryan Foley, MD sent at 07/02/2020  2:31 PM EST ----- I have placed a referral to rheumatology based on his positive rheumatoid factor, please update him.  Please also advise him that his ANA was positive which is a marker for autoimmune diseases and can be seen in patients with rheumatoid arthritis, connective tissue disease, and lupus, but can also be a nonspecific marker.  Again, rheumatological input will be of value in his case.  Muscle enzymes were fine.  A1c, which is a marker for diabetes was in the prediabetes range and vitamin D on the lower end of normal, also vitamin B12 on the lower end of the spectrum.  These may be vitamins that should be monitored, can be done with his primary care physician. May be worth rechecking in 3 to 6 months.  Vitamin B6 and B1 are still pending, we will update if abnormal.

## 2020-07-03 NOTE — Telephone Encounter (Signed)
I called pt. I discussed his results and recommendations. Pt is agreeable to a rheumatology referral. Pt verbalized understanding of results. Pt had no questions at this time but was encouraged to call back if questions arise.

## 2020-07-04 ENCOUNTER — Ambulatory Visit: Payer: BC Managed Care – PPO | Admitting: Cardiology

## 2020-07-06 LAB — ENA+DNA/DS+SJORGEN'S
ENA RNP Ab: <0.2 AI (ref 0.0–0.9)
ENA SM Ab Ser-aCnc: <0.2 AI (ref 0.0–0.9)
ENA SSA (RO) Ab: <0.2 AI (ref 0.0–0.9)
ENA SSB (LA) Ab: <0.2 AI (ref 0.0–0.9)
dsDNA Ab: 22 IU/mL — ABNORMAL HIGH (ref 0–9)

## 2020-07-06 LAB — B12 AND FOLATE PANEL
Folate: 6.8 ng/mL (ref >3.0)
Vitamin B-12: 428 pg/mL (ref 232–1245)

## 2020-07-06 LAB — VITAMIN B6: Vitamin B6: 13.7 ug/L (ref 5.3–46.7)

## 2020-07-06 LAB — SEDIMENTATION RATE: Sed Rate: 2 mm/hr (ref 0–30)

## 2020-07-06 LAB — RHEUMATOID FACTOR: Rheumatoid fact SerPl-aCnc: 17.4 IU/mL — ABNORMAL HIGH (ref <14.0)

## 2020-07-06 LAB — VITAMIN B1: Thiamine: 152.5 nmol/L (ref 66.5–200.0)

## 2020-07-06 LAB — CK: Total CK: 182 U/L (ref 41–331)

## 2020-07-06 LAB — HGB A1C W/O EAG: Hgb A1c MFr Bld: 5.7 % — ABNORMAL HIGH (ref 4.8–5.6)

## 2020-07-06 LAB — TSH: TSH: 1.33 u[IU]/mL (ref 0.450–4.500)

## 2020-07-06 LAB — VITAMIN D 25 HYDROXY (VIT D DEFICIENCY, FRACTURES): Vit D, 25-Hydroxy: 34.5 ng/mL (ref 30.0–100.0)

## 2020-07-06 LAB — ANA W/REFLEX: Anti Nuclear Antibody (ANA): POSITIVE — AB

## 2020-07-18 ENCOUNTER — Telehealth: Payer: Self-pay | Admitting: Neurology

## 2020-07-18 ENCOUNTER — Encounter: Payer: BC Managed Care – PPO | Admitting: Neurology

## 2020-07-18 NOTE — Telephone Encounter (Signed)
This is a second no-show for this patient, he no showed for a new patient appointment on 17 April 2020 and then as no showed for an EMG nerve conduction study today.

## 2020-07-24 ENCOUNTER — Other Ambulatory Visit (HOSPITAL_COMMUNITY): Payer: Self-pay | Admitting: Physician Assistant

## 2020-07-24 ENCOUNTER — Other Ambulatory Visit (HOSPITAL_COMMUNITY): Payer: Self-pay

## 2020-07-24 MED ORDER — FLECAINIDE ACETATE 100 MG PO TABS: ORAL_TABLET | ORAL | 3 refills | Status: DC

## 2020-07-26 MED ORDER — FLECAINIDE ACETATE 100 MG PO TABS: ORAL_TABLET | ORAL | 3 refills | Status: DC

## 2020-07-26 NOTE — Addendum Note (Signed)
Addended by: Margaret Pyle D on: 07/26/2020 01:40 PM   Modules accepted: Orders

## 2020-07-31 ENCOUNTER — Encounter: Payer: Self-pay | Admitting: Cardiology

## 2020-07-31 ENCOUNTER — Other Ambulatory Visit: Payer: Self-pay

## 2020-07-31 ENCOUNTER — Ambulatory Visit: Payer: BC Managed Care – PPO | Admitting: Cardiology

## 2020-07-31 VITALS — BP 124/64 | HR 66 | Ht 75.0 in | Wt 220.0 lb

## 2020-07-31 DIAGNOSIS — M79605 Pain in left leg: Secondary | ICD-10-CM

## 2020-07-31 DIAGNOSIS — M79604 Pain in right leg: Secondary | ICD-10-CM

## 2020-07-31 DIAGNOSIS — I4819 Other persistent atrial fibrillation: Secondary | ICD-10-CM

## 2020-07-31 NOTE — Progress Notes (Signed)
Electrophysiology Office Follow up Visit Note:    Date:  07/31/2020   ID:  Ryan [name], DOB 08-14-1965, MRN [id number]  PCP:  [facility]  [facility] Cardiologist:  Parke Poisson, MD  [facility] Electrophysiologist:  Lanier Prude, MD    Interval History:    Ryan [name] is a 55 y.o. male who presents for a follow up visit.  He underwent a successful ablation for persistent atrial fibrillation on 03/30/2020.  He saw Ryan Loa, PA-C in follow-up on 04/27/2020.  At that appointment he was maintaining sinus rhythm and felt significantly better since his ablation.  His atenolol was decreased to 25 mg once daily for bradycardia and he was continued on his flecainide 100 mg twice daily.  He continues to take 20 mg of Xarelto once daily.  Today he tells me that he is doing very well.  He feels significantly better since his A. fib ablation.  He is sleeping better.  He has less shortness of breath.  He has no palpitations.  He uses a cardia mobile to check his heart rhythm roughly once a day but sometimes has trouble with a lot of noise interference with recordings.  He has not detected a recurrence.  Given some trouble with the pharmacy, he has discontinued all of his medications including his anticoagulant.  He is also stopped his flecainide.  He does tell me that he has some bilateral lower extremity pain with exertion.  He thinks is related to arthritis.  He is seeing a rheumatologist in 2 weeks to discuss further.  He does have an extensive past smoking history (approximately 60 pack years).   Past Medical History:  Diagnosis Date  . Anxiety   . Arthritis    oa  . Chest pain since 02-11-2013   pt thinks associated with reflux  . Dyspnea    with exertion  . GERD (gastroesophageal reflux disease)   . H/O hiatal hernia     Past Surgical History:  Procedure Laterality Date  . ANKLE SURGERY Right    reconstruction to muscle  . ATRIAL  FIBRILLATION ABLATION N/A 03/30/2020   Procedure: ATRIAL FIBRILLATION ABLATION;  Surgeon: Lanier Prude, MD;  [facility];  Service: Cardiovascular;  Laterality: N/A;  . CARDIOVERSION N/A 02/25/2019   Procedure: CARDIOVERSION;  Surgeon: Little Ishikawa, MD;  Location: [facility] ENDOSCOPY;  Service: Endoscopy;  Laterality: N/A;  . CARDIOVERSION N/A 04/12/2019   Procedure: CARDIOVERSION;  Surgeon: Lars Masson, MD;  Location: [facility] ENDOSCOPY;  Service: Cardiovascular;  Laterality: N/A;  . ESOPHAGEAL MANOMETRY N/A 04/17/2015   Procedure: ESOPHAGEAL MANOMETRY (EM);  Surgeon: Charna Elizabeth, MD;  Location: [facility] ENDOSCOPY;  Service: Endoscopy;  Laterality: N/A;  . ESOPHAGOGASTRODUODENOSCOPY  11/09/2003  . FOOT SURGERY     right  . HIP SURGERY  age 68   left, done x 2  . KNEE ARTHROSCOPY Left 10/20/2003 and 2012  . LEFT HEART CATH AND CORONARY ANGIOGRAPHY N/A 12/29/2018   Procedure: LEFT HEART CATH AND CORONARY ANGIOGRAPHY;  Surgeon: Lennette Bihari, MD;  [facility]: [facility];  Service: Cardiovascular;  Laterality: N/A;  . MYRINGOTOMY WITH TUBE PLACEMENT Right 08/29/2015   Procedure: MYRINGOTOMY WITH TUBE PLACEMENT;  Surgeon: Newman Pies, MD;  Location: [facility];  Service: ENT;  Laterality: Right;  . PH IMPEDANCE STUDY N/A 04/17/2015   Procedure: PH IMPEDANCE STUDY;  Surgeon: Charna Elizabeth, MD;  Location: [facility] ENDOSCOPY;  Service: Endoscopy;  Laterality: N/A;  . SEPTOPLASTY N/A 08/29/2015   Procedure: SEPTOPLASTY;  Surgeon: Newman Pies, MD;  Location: [facility];  Service: ENT;  Laterality: N/A;  . TOTAL HIP ARTHROPLASTY Left 02/23/2013   Procedure: LEFT TOTAL HIP ARTHROPLASTY ANTERIOR APPROACH;  Surgeon: Shelda Pal, MD;  Location: [facility]S;  Service: Orthopedics;  Laterality: Left;  . TOTAL KNEE ARTHROPLASTY Right 11/11/2018   Procedure: TOTAL KNEE ARTHROPLASTY;  Surgeon: [name], MD;  Location: [facility]S;  Service: Orthopedics;  Laterality: Right;    . TURBINATE REDUCTION Bilateral 08/29/2015   Procedure: TURBINATE REDUCTION;  Surgeon: Newman Pies, MD;  Location:  SURGERY CENTER;  Service: ENT;  Laterality: Bilateral;  . URETHRA SURGERY   49yrs ago   for scar tissue    Current Medications: No outpatient medications have been marked as taking for the 07/31/20 encounter (Office Visit) with Lanier Prude, MD.     Allergies:   Patient has no known allergies.   Social History   Socioeconomic History  . Marital status: Married    Spouse name: Not on file  . Number of children: Not on file  . Years of education: Not on file  . Highest education level: Not on file  Occupational History  . Not on file  Tobacco Use  . Smoking status: Former Smoker    Packs/day: 1.50    Years: 25.00    Pack years: 37.50    Types: Cigarettes    Quit date: 05/29/2017    Years since quitting: 3.1  . Smokeless tobacco: Never Used  . Tobacco comment: quit 2018  Vaping Use  . Vaping Use: Never used  Substance and Sexual Activity  . Alcohol use: Yes    Alcohol/week: 2.0 standard drinks    Types: 2 Standard drinks or equivalent per week    Comment: ocassionally  . Drug use: Not Currently    Types: Marijuana    Comment: occ marijuana last used 1 week ago  . Sexual activity: Not on file  Other Topics Concern  . Not on file  Social History Narrative  . Not on file   Social Determinants of Health   Financial Resource Strain: Not on file  Food Insecurity: Not on file  Transportation Needs: Not on file  Physical Activity: Not on file  Stress: Not on file  Social Connections: Not on file     Family History: The patient's family history includes Atrial fibrillation in his father.  ROS:   Please see the history of present illness.    All other systems reviewed and are negative.  EKGs/Labs/Other Studies Reviewed:    The following studies were reviewed today:   EKG:  The ekg ordered today demonstrates sinus  rhythm  Recent Labs: 03/02/2020: BUN 12; Creatinine, Ser 1.00; Hemoglobin 12.9; Platelets 219; Potassium 4.4; Sodium 143 06/28/2020: TSH 1.330  Recent Lipid Panel No results found for: CHOL, TRIG, HDL, CHOLHDL, VLDL, LDLCALC, LDLDIRECT  Physical Exam:    VS:  Ht 6\' 3"  (1.905 m)   Wt 220 lb (99.8 kg)   BMI 27.50 kg/m     Wt Readings from Last 3 Encounters:  07/31/20 220 lb (99.8 kg)  06/28/20 220 lb (99.8 kg)  04/27/20 220 lb 6.4 oz (100 kg)     GEN:  Well nourished, well developed in no acute distress HEENT: Normal NECK: No JVD; No carotid bruits LYMPHATICS: No lymphadenopathy CARDIAC: RRR, no murmurs, rubs, gallops RESPIRATORY:  Clear to auscultation without rales, wheezing or  rhonchi  ABDOMEN: Soft, non-tender, non-distended MUSCULOSKELETAL:  No edema; No deformity  SKIN: Warm and dry NEUROLOGIC:  Alert and oriented x 3 PSYCHIATRIC:  Normal affect   ASSESSMENT:    1. Persistent atrial fibrillation (HCC)    PLAN:    In order of problems listed above:  1. Persistent atrial fibrillation Maintaining sinus rhythm after his ablation. He is now off of his Xarelto and flecainide. We discussed surveillance methods for recurrence of atrial fibrillation.  He would like to pursue implanting a loop recorder to help monitor for A. fib recurrence in the future.  We discussed the procedure during today's visit and he would like to proceed.  2.  Leg pain Patient has history of severe arthritis with multiple joint replacements.  He is seeing a rheumatologist soon to discuss the possibility of a lupus diagnosis.  If that appointment does not elucidate a cause for his lower extremity pain with exertion, he will reach out to [place] for referral to Dr. Gery Pray to discuss the possibility of peripheral vascular disease contributing to his symptoms.  Medication Adjustments/Labs and Tests Ordered: Current medicines are reviewed at length with the patient today.  Concerns regarding medicines are  outlined above.  No orders of the defined types were placed in this encounter.  No orders of the defined types were placed in this encounter.    Signed, Steffanie Dunn, MD, [facility]  07/31/2020 10:44 AM    Electrophysiology [facility]

## 2020-07-31 NOTE — Patient Instructions (Signed)
Medication Instructions:  Your physician has recommended you make the following change in your medication:  All medications have been discontinued.  *If you need a refill on your cardiac medications before your next appointment, please call your pharmacy*   Lab Work: None ordered.  If you have labs (blood work) drawn today and your tests are completely normal, you will receive your results only by: Marland Kitchen MyChart Message (if you have MyChart) OR . A paper copy in the mail If you have any lab test that is abnormal or we need to change your treatment, we will call you to review the results.   Testing/Procedures: Dr Lalla Brothers recommends implanting a Loop Recorder to constantly monitor your heart rhythm.   Follow-Up: At [facility], you and your health needs are our priority.  As part of our continuing mission to provide you with exceptional heart care, we have created designated Provider Care Teams.  These Care Teams include your primary Cardiologist (physician) and Advanced Practice Providers (APPs -  Physician Assistants and Nurse Practitioners) who all work together to provide you with the care you need, when you need it.  We recommend signing up for the patient portal called "MyChart".  Sign up information is provided on this After Visit Summary.  MyChart is used to connect with patients for Virtual Visits (Telemedicine).  Patients are able to view lab/test results, encounter notes, upcoming appointments, etc.  Non-urgent messages can be sent to your provider as well.   To learn more about what you can do with MyChart, go to [email or web address].    Your next appointment:   Loop Implant 08/29/2020 with Dr Lalla Brothers.

## 2020-08-07 NOTE — Progress Notes (Signed)
Office Visit Note  Patient: Ryan [name]             Date of Birth: 1966-01-15           MRN: [id number]             PCP: [facility] Referring: Huston Foley, MD Visit Date: 08/08/2020 Occupation: Mechanic  Subjective:   History of Present Illness: LAVAL CAFARO is a 55 y.o. male here for evaluation of arthritis with positive RF and dsDNA. He has chronic history of mild joint pains in multiple sites, and history of left THA. More recently he was evaluated for Afib with unstable angina with additional of multiple medications including flecainide, xarelto, lipitor, and others. 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 He has stiffness in the hands lasting around 30 minutes in AM and pain that worsens with prolonged use during the day. But more recently he developed increase in pain in the back, hips, legs that is not localized to the joints but aches all over the muscles and bones. This worsens when standing all day. When he is able to exercise and stretch more such as playing a round of golf he notices some improvement following the activity. He is no taking medication for this. He discontinued taking the medications for the afib about a month ago.  Labs reviewed ANA pos dsDNA 22 RF 17.4   Activities of Daily Living:  Patient reports morning stiffness for 30-40 minutes.   Patient Reports nocturnal pain.  Difficulty dressing/grooming: Reports Difficulty climbing stairs: Reports Difficulty getting out of chair: Denies Difficulty using hands for taps, buttons, cutlery, and/or writing: Reports  Review of Systems  Constitutional: Positive for fatigue.  HENT: Negative for mouth sores, mouth dryness and nose dryness.   Eyes: Negative for pain, itching and dryness.  Respiratory: Negative for shortness of breath and difficulty breathing.   Cardiovascular: Positive for chest pain. Negative for palpitations.   Gastrointestinal: Negative for blood in stool, constipation and diarrhea.  Endocrine: Negative for increased urination.  Genitourinary: Negative for difficulty urinating.  Musculoskeletal: Positive for arthralgias, joint pain, joint swelling, myalgias, morning stiffness, muscle tenderness and myalgias.  Skin: Negative for color change, rash and redness.  Allergic/Immunologic: Negative for susceptible to infections.  Neurological: Positive for numbness and headaches. Negative for dizziness and memory loss.  Hematological: Negative for bruising/bleeding tendency.  Psychiatric/Behavioral: Positive for sleep disturbance. Negative for confusion.    PMFS History:  Patient Active Problem List   Diagnosis Date Noted   Positive ANA (antinuclear antibody) 08/09/2020   Rash 03/07/2020   Tinea cruris 03/07/2020   Chronic pain of multiple joints 10/28/2019   GERD (gastroesophageal reflux disease) 04/06/2019   Anticoagulated 03/25/2019   Drug reaction 01/26/2019   Obstructive sleep apnea 01/26/2019   Normal coronary arteries 01/26/2019   Unstable angina (HCC)    Persistent atrial fibrillation (HCC)    Snoring 12/03/2018   Dyspnea on exertion 12/03/2018   H/O total knee replacement, right 11/11/2018   Osteoarthritis of left knee 06/12/2018   Cellulitis of neck 03/11/2017   Hyperlipidemia 11/07/2015   Plantar fasciitis 08/08/2015   Overweight (BMI 25.0-29.9) 02/24/2013   S/P left THA, AA 02/23/2013    Past Medical History:  Diagnosis Date   Anxiety    Arthritis    oa   Chest pain since 02-11-2013   pt thinks associated with reflux   Dyspnea    with exertion  GERD (gastroesophageal reflux disease)    H/O hiatal hernia     Family History  Problem Relation Age of Onset   Atrial fibrillation Father    ALS Father    Healthy Son    Healthy Son    Healthy Daughter    Past Surgical History:  Procedure Laterality Date   ANKLE SURGERY Right     reconstruction to muscle   ATRIAL FIBRILLATION ABLATION N/A 03/30/2020   Procedure: ATRIAL FIBRILLATION ABLATION;  Surgeon: Lanier Prude, MD;  [facility]: [facility];  Service: Cardiovascular;  Laterality: N/A;   CARDIOVERSION N/A 02/25/2019   Procedure: CARDIOVERSION;  Surgeon: Little Ishikawa, MD;  Location: [facility] ENDOSCOPY;  Service: Endoscopy;  Laterality: N/A;   CARDIOVERSION N/A 04/12/2019   Procedure: CARDIOVERSION;  Surgeon: Lars Masson, MD;  Location: [facility] ENDOSCOPY;  Service: Cardiovascular;  Laterality: N/A;   ESOPHAGEAL MANOMETRY N/A 04/17/2015   Procedure: ESOPHAGEAL MANOMETRY (EM);  Surgeon: Charna Elizabeth, MD;  Location: [facility] ENDOSCOPY;  Service: Endoscopy;  Laterality: N/A;   ESOPHAGOGASTRODUODENOSCOPY  11/09/2003   FOOT SURGERY     right   HIP SURGERY  age 37   left, done x 2   KNEE ARTHROSCOPY Left 10/20/2003 and 2012   LEFT HEART CATH AND CORONARY ANGIOGRAPHY N/A 12/29/2018   Procedure: LEFT HEART CATH AND CORONARY ANGIOGRAPHY;  Surgeon: Lennette Bihari, MD;  Location: [facility];  Service: Cardiovascular;  Laterality: N/A;   MYRINGOTOMY WITH TUBE PLACEMENT Right 08/29/2015   Procedure: MYRINGOTOMY WITH TUBE PLACEMENT;  Surgeon: Newman Pies, MD;  Location: [facility];  Service: ENT;  Laterality: Right;   PH IMPEDANCE STUDY N/A 04/17/2015   Procedure: PH IMPEDANCE STUDY;  Surgeon: Charna Elizabeth, MD;  Location: [facility] ENDOSCOPY;  Service: Endoscopy;  Laterality: N/A;   SEPTOPLASTY N/A 08/29/2015   Procedure: SEPTOPLASTY;  Surgeon: Newman Pies, MD;  Location: [facility];  Service: ENT;  Laterality: N/A;   TOTAL HIP ARTHROPLASTY Left 02/23/2013   Procedure: LEFT TOTAL HIP ARTHROPLASTY ANTERIOR APPROACH;  Surgeon: Shelda Pal, MD;  Location: [facility];  Service: Orthopedics;  Laterality: Left;   TOTAL KNEE ARTHROPLASTY Right 11/11/2018   Procedure: TOTAL KNEE ARTHROPLASTY;  Surgeon: [name], MD;  Location: [facility];  Service:  Orthopedics;  Laterality: Right;    TURBINATE REDUCTION Bilateral 08/29/2015   Procedure: TURBINATE REDUCTION;  Surgeon: Newman Pies, MD;  Location: [facility];  Service: ENT;  Laterality: Bilateral;   URETHRA SURGERY   87yrs ago   for scar tissue   Social History   Social History Narrative   Not on file   Immunization History  Administered Date(s) Administered   PFIZER(Purple Top)SARS-COV-2 Vaccination 02/24/2020, 03/16/2020   Pneumococcal Polysaccharide-23 11/02/2012   Td 11/02/2012   Tdap 07/20/2018   Zoster 09/15/2012     Objective: Vital Signs: BP (!) 144/93 (BP Location: Right Arm, Patient Position: Sitting, Cuff Size: Normal)    Pulse 66    Resp 17    Ht 6\' 2"  (1.88 m)    Wt 225 lb (102.1 kg)    BMI 28.89 kg/m    Physical Exam HENT:     Head: Normocephalic.     Right Ear: External ear normal.     Mouth/Throat:     Mouth: Mucous membranes are moist.     Pharynx: Oropharynx is clear.  Eyes:     Conjunctiva/sclera: Conjunctivae normal.  Cardiovascular:     Rate and Rhythm: Normal rate  and regular rhythm.  Pulmonary:     Effort: Pulmonary effort is normal.     Breath sounds: Normal breath sounds.  Skin:    General: Skin is warm and dry.     Findings: No rash.  Neurological:     General: No focal deficit present.     Mental Status: He is alert.  Psychiatric:        Mood and Affect: Mood normal.     Musculoskeletal Exam:  Neck full range of motion, tenderness at base of neck mid and left paraspinal muscles Shoulder, elbow, wrist, fingers full range of motion, bony enlargement of MCP and IP joints, thick palmar calluses with probable early flexor tendon nodules Lumbar paraspinal tenderness Normal hip internal and external rotation without pain, no tenderness to lateral hip palpation, right lateral hip pain with pace maneuver, left lateral hip pain with FABER maneuver Knees, ankles, MTPs full range of motion no tenderness or swelling, large  1st MTP bunions with toe deviation, tenderness to calf squeeze but no achilles or heel tenderness   Investigation: No additional findings.  Imaging: XR Hand 2 View Left  Result Date: 08/08/2020 X-ray left hand 2 views Radiocarpal carpal joint spaces appear normal.  MCP joints appear normal.  PIP joints.  Normal mild asymmetric joint space loss and DIPs most advanced degenerative changes in second DIP on radial aspect.  Bone mineralization appears normal.  No soft tissue swelling seen. Impression Early osteoarthritis changes most advanced at second DIP no inflammatory changes seen  XR Hand 2 View Right  Result Date: 08/08/2020 X-ray right hand 2 views Radiocarpal joint space appears normal.  Carpal joint for condition sent first [facility] osteoarthritis.  MCP joints appear normal.  Minimal PIP joint changes but significant asymmetric space loss and increased sclerosis in DIP joints no erosions.  Bone mineralization appears normal.  No soft tissue swelling seen. Impression Early osteoarthritic changes of the first [facility] joint and DIPs no inflammatory disease changes   Recent Labs: Lab Results  Component Value Date   WBC 7.9 03/02/2020   HGB 12.9 (L) 03/02/2020   PLT 219 03/02/2020   NA 143 03/02/2020   K 4.4 03/02/2020   CL 107 (H) 03/02/2020   CO2 24 03/02/2020   GLUCOSE 81 03/02/2020   BUN 12 03/02/2020   CREATININE 1.00 03/02/2020   BILITOT 0.3 12/23/2018   ALKPHOS 97 12/23/2018   AST 13 12/23/2018   ALT 19 12/23/2018   PROT 6.7 12/23/2018   ALBUMIN 4.8 12/23/2018   CALCIUM 9.2 03/02/2020   GFRAA 99 03/02/2020    Speciality Comments: No specialty comments available.  Procedures:  No procedures performed Allergies: Patient has no known allergies.   Assessment / Plan:     Visit Diagnoses: Chronic pain of multiple joints - Plan: XR Hand 2 View Right, XR Hand 2 View Left  He has generalized osteoarthritis but I do not see changes suggesting inflammatory disease on exam. The history  of some stiffness and improvement with activity could indicate some inflammatory component. A lot of the pain that is more recent seems like myalgias rather than joint inflammation. Will check hand xrays for changes. Do not recommend any DMARD treatment at this time.  Rash  No active skin disease on exam today. Hyperkeratotic changes on hands with callus and some probable early flexor tendon nodules consistent with occupation.  Positive ANA (antinuclear antibody)  Low positive dsDNA and RF both without inflammatory disease changes on exam and history. Suspicion for medication  or infection induced process. Recommend continue another month off many of the previous treatments and will repeat testing and see if these aches are improving or not.  Orders: Orders Placed This Encounter  Procedures   XR Hand 2 View Right   XR Hand 2 View Left   No orders of the defined types were placed in this encounter.    Follow-Up Instructions: Return in about 4 weeks (around 09/05/2020), or Arthritis, myalgias f/u.   Fuller Plan, MD  Note - This record has been created using [employer].  Chart creation errors have been sought, but may not always  have been located. Such creation errors do not reflect on  the standard of medical care.

## 2020-08-08 ENCOUNTER — Ambulatory Visit: Payer: Self-pay

## 2020-08-08 ENCOUNTER — Encounter: Payer: Self-pay | Admitting: Internal Medicine

## 2020-08-08 ENCOUNTER — Other Ambulatory Visit: Payer: Self-pay

## 2020-08-08 ENCOUNTER — Ambulatory Visit: Payer: BC Managed Care – PPO | Admitting: Internal Medicine

## 2020-08-08 VITALS — BP 144/93 | HR 66 | Resp 17 | Ht 74.0 in | Wt 225.0 lb

## 2020-08-08 DIAGNOSIS — R768 Other specified abnormal immunological findings in serum: Secondary | ICD-10-CM

## 2020-08-08 DIAGNOSIS — R21 Rash and other nonspecific skin eruption: Secondary | ICD-10-CM | POA: Diagnosis not present

## 2020-08-08 DIAGNOSIS — M2550 Pain in unspecified joint: Secondary | ICD-10-CM | POA: Diagnosis not present

## 2020-08-08 DIAGNOSIS — M79641 Pain in right hand: Secondary | ICD-10-CM | POA: Diagnosis not present

## 2020-08-08 DIAGNOSIS — G8929 Other chronic pain: Secondary | ICD-10-CM

## 2020-08-08 DIAGNOSIS — M79642 Pain in left hand: Secondary | ICD-10-CM

## 2020-08-08 NOTE — Patient Instructions (Addendum)
I believe your hand pain is coming from arthritis, your pain in the legs and flanks seems more related to muscle pain and some spasticity. I am getting hand xrays to see if there are changes suggesting inflammatory process or just osteoarthritis.  I do not think your positive antibody tests are likely to represent a true autoimmune disease, I suspect they may be related to previous infection or medication exposure and would like to follow up in about a month to repeat test and also recheck your symptoms.

## 2020-08-09 DIAGNOSIS — R7689 Other specified abnormal immunological findings in serum: Secondary | ICD-10-CM | POA: Insufficient documentation

## 2020-08-09 DIAGNOSIS — R768 Other specified abnormal immunological findings in serum: Secondary | ICD-10-CM | POA: Insufficient documentation

## 2020-08-13 NOTE — Progress Notes (Signed)
Hand xrays were reviewed these show osteoarthritis particularly at the base of the thumb and ends of the fingers but no changes suggesting an underlying inflammatory problem.

## 2020-08-14 ENCOUNTER — Encounter: Payer: BC Managed Care – PPO | Admitting: Neurology

## 2020-08-14 ENCOUNTER — Telehealth: Payer: Self-pay | Admitting: Neurology

## 2020-08-14 NOTE — Telephone Encounter (Signed)
This patient canceled the same day of a EMG appointment, this represents his third event, he will be discharged from our practice.

## 2020-08-29 ENCOUNTER — Ambulatory Visit: Payer: BC Managed Care – PPO | Admitting: Cardiology

## 2020-08-29 NOTE — Progress Notes (Deleted)
Electrophysiology Office Follow up Visit Note:    Date:  08/29/2020   ID:  Ryan [name], DOB 1966/05/28, MRN [id number]  PCP:  [facility]  [facility] Cardiologist:  Parke Poisson, MD  [facility] Electrophysiologist:  Lanier Prude, MD    Interval History:    Ryan [name] is a 55 y.o. male who presents for a follow up visit.  I last saw the patient July 31, 2020.  He is maintaining sinus rhythm after his ablation and is now off of his flecainide.  At her last appointment we discussed using a loop recorder for ongoing surveillance for atrial fibrillation given his desire to be off of long-term anticoagulation if possible.  He presents today for loop recorder implant.  He has had no recurrence of atrial fibrillation to his knowledge.    Past Medical History:  Diagnosis Date  . Anxiety   . Arthritis    oa  . Chest pain since 02-11-2013   pt thinks associated with reflux  . Dyspnea    with exertion  . GERD (gastroesophageal reflux disease)   . H/O hiatal hernia     Past Surgical History:  Procedure Laterality Date  . ANKLE SURGERY Right    reconstruction to muscle  . ATRIAL FIBRILLATION ABLATION N/A 03/30/2020   Procedure: ATRIAL FIBRILLATION ABLATION;  Surgeon: Lanier Prude, MD;  [facility];  Service: Cardiovascular;  Laterality: N/A;  . CARDIOVERSION N/A 02/25/2019   Procedure: CARDIOVERSION;  Surgeon: Little Ishikawa, MD;  Location: [facility] ENDOSCOPY;  Service: Endoscopy;  Laterality: N/A;  . CARDIOVERSION N/A 04/12/2019   Procedure: CARDIOVERSION;  Surgeon: Lars Masson, MD;  Location: [facility] ENDOSCOPY;  Service: Cardiovascular;  Laterality: N/A;  . ESOPHAGEAL MANOMETRY N/A 04/17/2015   Procedure: ESOPHAGEAL MANOMETRY (EM);  Surgeon: Charna Elizabeth, MD;  Location: [facility] ENDOSCOPY;  Service: Endoscopy;  Laterality: N/A;  . ESOPHAGOGASTRODUODENOSCOPY  11/09/2003  . FOOT SURGERY     right  . HIP SURGERY   age 47   left, done x 2  . KNEE ARTHROSCOPY Left 10/20/2003 and 2012  . LEFT HEART CATH AND CORONARY ANGIOGRAPHY N/A 12/29/2018   Procedure: LEFT HEART CATH AND CORONARY ANGIOGRAPHY;  Surgeon: Lennette Bihari, MD;  [facility]: [facility];  Service: Cardiovascular;  Laterality: N/A;  . MYRINGOTOMY WITH TUBE PLACEMENT Right 08/29/2015   Procedure: MYRINGOTOMY WITH TUBE PLACEMENT;  Surgeon: Newman Pies, MD;  Location: [facility];  Service: ENT;  Laterality: Right;  . PH IMPEDANCE STUDY N/A 04/17/2015   Procedure: PH IMPEDANCE STUDY;  Surgeon: Charna Elizabeth, MD;  Location: [facility]OSCOPY;  Service: Endoscopy;  Laterality: N/A;  . SEPTOPLASTY N/A 08/29/2015   Procedure: SEPTOPLASTY;  Surgeon: Newman Pies, MD;  Location: [facility];  Service: ENT;  Laterality: N/A;  . TOTAL HIP ARTHROPLASTY Left 02/23/2013   Procedure: LEFT TOTAL HIP ARTHROPLASTY ANTERIOR APPROACH;  Surgeon: Shelda Pal, MD;  Location: [facility];  Service: Orthopedics;  Laterality: Left;  . TOTAL KNEE ARTHROPLASTY Right 11/11/2018   Procedure: TOTAL KNEE ARTHROPLASTY;  Surgeon: [name], MD;  Location: [facility];  Service: Orthopedics;  Laterality: Right;   . TURBINATE REDUCTION Bilateral 08/29/2015   Procedure: TURBINATE REDUCTION;  Surgeon: Newman Pies, MD;  Location: [facility];  Service: ENT;  Laterality: Bilateral;  . URETHRA SURGERY   90yrs ago   for scar tissue    Current Medications: No outpatient medications have been  marked as taking for the 08/29/20 encounter (Appointment) with Lanier Prude, MD.     Allergies:   Patient has no known allergies.   Social History   Socioeconomic History  . Marital status: Married    Spouse name: Not on file  . Number of children: Not on file  . Years of education: Not on file  . Highest education level: Not on file  Occupational History  . Not on file  Tobacco Use  . Smoking status: Former Smoker    Packs/day: 1.50    Years: 25.00     Pack years: 37.50    Types: Cigarettes    Quit date: 05/29/2017    Years since quitting: 3.2  . Smokeless tobacco: Former Neurosurgeon    Quit date: 05/10/2015  . Tobacco comment: quit 2018  Vaping Use  . Vaping Use: Never used  Substance and Sexual Activity  . Alcohol use: Yes    Alcohol/week: 2.0 standard drinks    Types: 2 Standard drinks or equivalent per week    Comment: ocassionally  . Drug use: Not Currently    Types: Marijuana  . Sexual activity: Not on file  Other Topics Concern  . Not on file  Social History Narrative  . Not on file   Social Determinants of Health   Financial Resource Strain: Not on file  Food Insecurity: Not on file  Transportation Needs: Not on file  Physical Activity: Not on file  Stress: Not on file  Social Connections: Not on file     Family History: The patient's family history includes ALS in his father; Atrial fibrillation in his father; Healthy in his daughter, son, and son.  ROS:   Please see the history of present illness.    All other systems reviewed and are negative.  EKGs/Labs/Other Studies Reviewed:    The following studies were reviewed today:    Recent Labs: 03/02/2020: BUN 12; Creatinine, Ser 1.00; Hemoglobin 12.9; Platelets 219; Potassium 4.4; Sodium 143 06/28/2020: TSH 1.330  Recent Lipid Panel No results found for: CHOL, TRIG, HDL, CHOLHDL, VLDL, LDLCALC, LDLDIRECT  Physical Exam:    VS:  There were no vitals taken for this visit.    Wt Readings from Last 3 Encounters:  08/08/20 225 lb (102.1 kg)  07/31/20 220 lb (99.8 kg)  06/28/20 220 lb (99.8 kg)     GEN:  Well nourished, well developed in no acute distress HEENT: Normal NECK: No JVD; No carotid bruits LYMPHATICS: No lymphadenopathy CARDIAC: RRR, no murmurs, rubs, gallops RESPIRATORY:  Clear to auscultation without rales, wheezing or rhonchi  ABDOMEN: Soft, non-tender, non-distended MUSCULOSKELETAL:  No edema; No deformity  SKIN: Warm and dry NEUROLOGIC:   Alert and oriented x 3 PSYCHIATRIC:  Normal affect   ASSESSMENT:    No diagnosis found. PLAN:    In order of problems listed above:  1. Symptomatic persistent atrial fibrillation Doing well now post ablation.  Maintaining sinus rhythm. Plan to implant loop recorder today in an effort to stop long-term anticoagulation.  Procedure discussed in detail with the patient including risks and he wishes to proceed.     Medication Adjustments/Labs and Tests Ordered: Current medicines are reviewed at length with the patient today.  Concerns regarding medicines are outlined above.  No orders of the defined types were placed in this encounter.  No orders of the defined types were placed in this encounter.    Signed, Steffanie Dunn, MD, [facility]  08/29/2020 7:50 AM    Electrophysiology [facility]       ----------------------------------------------------------------------------------------- SURGEON:  Steffanie Dunn, MD    PREPROCEDURE DIAGNOSIS:  Atrial fibrillation, palpitations    POSTPROCEDURE DIAGNOSIS:  Atrial fibrillation, palpitations     PROCEDURES:   1. Implantable loop recorder implantation    INTRODUCTION:  PRIMUS GRITTON is a 55 y.o. male with a history of palpitations and atrial fibrillation who presents today for implantable loop implantation.  The patient has had difficult to manage atrial fibrillation with frequent palpitations.  *** The patient is also s/p prior atrial fibrillation ablation.   he has worn telemetry previously during which he did not have arrhythmias.  There is significant concern for possible atrial fibrillation as the cause for the palpitations.  In addition long term afib management is felt to require additional monitoring.  The patient therefore presents today for implantable loop implantation.     DESCRIPTION OF PROCEDURE:  Informed written consent was obtained.  The patient required no sedation for the procedure today.   Mapping over the patient's chest was performed to identify the area where electrograms were most prominent for ILR recording.  This area was found to be the left parasternal region over the 3rd-4th intercostal space. The patients left chest was therefore prepped and draped in the usual sterile fashion. The skin overlying the left parasternal region was infiltrated with lidocaine for local analgesia.  A 0.5-cm incision was made over the left parasternal region over the 3rd intercostal space.  A subcutaneous ILR pocket was fashioned using a combination of sharp and blunt dissection.  A Medtronic Reveal Linq model [id number] implantable loop recorder was then placed into the pocket  R waves were very prominent and measured >0.56mV.  Steri- Strips and a sterile dressing were then applied.  There were no early apparent complications.     CONCLUSIONS:   1. Successful implantation of a Medtronic Reveal LINQ implantable loop recorder for palpitations and recurrent symptoms of atrial fibrillation  2. No early apparent complications.   Steffanie Dunn, MD 08/29/2020 7:51 AM

## 2020-09-04 NOTE — Progress Notes (Deleted)
Office Visit Note  Patient: Ryan [name]             Date of Birth: [birth year]           MRN: [id number]             PCP: [facility] Referring: Roe Coombs* Visit Date: 09/05/2020   Subjective:  No chief complaint on file.   History of Present Illness: SHANNAN GARFINKEL is a 55 y.o. male here for follow up of joint pain in multiple sites with positive serology. He did not have obvious inflammatory changes and picture was suspicious for drug or infection induced antibodies with pan to follow up for repeat assessment.***     No Rheumatology ROS completed.    Previous HPI: OSWIN GRIFFITH is a 55 y.o. male here for evaluation of arthritis with positive RF and dsDNA. He has chronic history of mild joint pains in multiple sites, and history of left THA. More recently he was evaluated for Afib with unstable angina with additional of multiple medications including flecainide, xarelto, lipitor, and others. 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 He has stiffness in the hands lasting around 30 minutes in AM and pain that worsens with prolonged use during the day. But more recently he developed increase in pain in the back, hips, legs that is not localized to the joints but aches all over the muscles and bones. This worsens when standing all day. When he is able to exercise and stretch more such as playing a round of golf he notices some improvement following the activity. He is no taking medication for this. He discontinued taking the medications for the afib about a month ago.  Labs reviewed ANA pos dsDNA 22 RF 17.4   PMFS History:  Patient Active Problem List   Diagnosis Date Noted  . Positive ANA (antinuclear antibody) 08/09/2020  . Rash 03/07/2020  . Tinea cruris 03/07/2020  . Chronic pain of multiple joints 10/28/2019  . GERD (gastroesophageal reflux disease) 04/06/2019  . Anticoagulated  03/25/2019  . Drug reaction 01/26/2019  . Obstructive sleep apnea 01/26/2019  . Normal coronary arteries 01/26/2019  . Unstable angina (HCC)   . Persistent atrial fibrillation (HCC)   . Snoring 12/03/2018  . Dyspnea on exertion 12/03/2018  . H/O total knee replacement, right 11/11/2018  . Osteoarthritis of left knee 06/12/2018  . Cellulitis of neck 03/11/2017  . Hyperlipidemia 11/07/2015  . Plantar fasciitis 08/08/2015  . Overweight (BMI 25.0-29.9) 02/24/2013  . S/P left THA, AA 02/23/2013    Past Medical History:  Diagnosis Date  . Anxiety   . Arthritis    oa  . Chest pain since 02-11-2013   pt thinks associated with reflux  . Dyspnea    with exertion  . GERD (gastroesophageal reflux disease)   . H/O hiatal hernia     Family History  Problem Relation Age of Onset  . Atrial fibrillation Father   . ALS Father   . Healthy Son   . Healthy Son   . Healthy Daughter    Past Surgical History:  Procedure Laterality Date  . ANKLE SURGERY Right    reconstruction to muscle  . ATRIAL FIBRILLATION ABLATION N/A 03/30/2020   Procedure: ATRIAL FIBRILLATION ABLATION;  Surgeon: Lanier Prude, MD;  [facility]: [facility];  Service: Cardiovascular;  Laterality: N/A;  . CARDIOVERSION N/A 02/25/2019   Procedure: CARDIOVERSION;  Surgeon: Epifanio Lesches  L, MD;  Location: [facility] ENDOSCOPY;  Service: Endoscopy;  Laterality: N/A;  . CARDIOVERSION N/A 04/12/2019   Procedure: CARDIOVERSION;  Surgeon: Lars Masson, MD;  Location: [facility] ENDOSCOPY;  Service: Cardiovascular;  Laterality: N/A;  . ESOPHAGEAL MANOMETRY N/A 04/17/2015   Procedure: ESOPHAGEAL MANOMETRY (EM);  Surgeon: Charna Elizabeth, MD;  Location: [facility]OSCOPY;  Service: Endoscopy;  Laterality: N/A;  . ESOPHAGOGASTRODUODENOSCOPY  11/09/2003  . FOOT SURGERY     right  . HIP SURGERY  age 37   left, done x 2  . KNEE ARTHROSCOPY Left 10/20/2003 and 2012  . LEFT HEART CATH AND CORONARY ANGIOGRAPHY N/A 12/29/2018   Procedure: LEFT  HEART CATH AND CORONARY ANGIOGRAPHY;  Surgeon: Lennette Bihari, MD;  [facility]: [facility];  Service: Cardiovascular;  Laterality: N/A;  . MYRINGOTOMY WITH TUBE PLACEMENT Right 08/29/2015   Procedure: MYRINGOTOMY WITH TUBE PLACEMENT;  Surgeon: Newman Pies, MD;  Location: [facility];  Service: ENT;  Laterality: Right;  . PH IMPEDANCE STUDY N/A 04/17/2015   Procedure: PH IMPEDANCE STUDY;  Surgeon: Charna Elizabeth, MD;  Location: [facility]OS[facility];  Service: Endoscopy;  Laterality: N/A;  . SEPTOPLASTY N/A 08/29/2015   Procedure: SEPTOPLASTY;  Surgeon: Newman Pies, MD;  Location: [facility];  Service: ENT;  Laterality: N/A;  . TOTAL HIP ARTHROPLASTY Left 02/23/2013   Procedure: LEFT TOTAL HIP ARTHROPLASTY ANTERIOR APPROACH;  Surgeon: Shelda Pal, MD;  Location: [facility];  Service: Orthopedics;  Laterality: Left;  . TOTAL KNEE ARTHROPLASTY Right 11/11/2018   Procedure: TOTAL KNEE ARTHROPLASTY;  Surgeon: [name], MD;  Location: [facility];  Service: Orthopedics;  Laterality: Right;   . TURBINATE REDUCTION Bilateral 08/29/2015   Procedure: TURBINATE REDUCTION;  Surgeon: Newman Pies, MD;  Location: [facility];  Service: ENT;  Laterality: Bilateral;  . URETHRA SURGERY   16yrs ago   for scar tissue   Social History   Social History Narrative  . Not on file   Immunization History  Administered Date(s) Administered  . PFIZER(Purple Top)SARS-COV-2 Vaccination 02/24/2020, 03/16/2020  . Pneumococcal Polysaccharide-23 11/02/2012  . Td 11/02/2012  . Tdap 07/20/2018  . Zoster 09/15/2012     Objective: Vital Signs: There were no vitals taken for this visit.   Physical Exam   Musculoskeletal Exam: ***  CDAI Exam: CDAI Score: -- Patient Global: --; Provider Global: -- Swollen: --; Tender: -- Joint Exam 09/05/2020   No joint exam has been documented for this visit   There is currently no information documented on the homunculus. Go to the Rheumatology  activity and complete the homunculus joint exam.  Investigation: No additional findings.  Imaging: XR Hand 2 View Left  Result Date: 08/08/2020 X-ray left hand 2 views Radiocarpal carpal joint spaces appear normal.  MCP joints appear normal.  PIP joints.  Normal mild asymmetric joint space loss and DIPs most advanced degenerative changes in second DIP on radial aspect.  Bone mineralization appears normal.  No soft tissue swelling seen. Impression Early osteoarthritis changes most advanced at second DIP no inflammatory changes seen  XR Hand 2 View Right  Result Date: 08/08/2020 X-ray right hand 2 views Radiocarpal joint space appears normal.  Carpal joint for condition sent first [facility] osteoarthritis.  MCP joints appear normal.  Minimal PIP joint changes but significant asymmetric space loss and increased sclerosis in DIP joints no erosions.  Bone mineralization appears normal.  No soft tissue swelling seen. Impression Early osteoarthritic changes of the first [facility] joint and  DIPs no inflammatory disease changes   Recent Labs: Lab Results  Component Value Date   WBC 7.9 03/02/2020   HGB 12.9 (L) 03/02/2020   PLT 219 03/02/2020   NA 143 03/02/2020   K 4.4 03/02/2020   CL 107 (H) 03/02/2020   CO2 24 03/02/2020   GLUCOSE 81 03/02/2020   BUN 12 03/02/2020   CREATININE 1.00 03/02/2020   BILITOT 0.3 12/23/2018   ALKPHOS 97 12/23/2018   AST 13 12/23/2018   ALT 19 12/23/2018   PROT 6.7 12/23/2018   ALBUMIN 4.8 12/23/2018   CALCIUM 9.2 03/02/2020   GFRAA 99 03/02/2020    Speciality Comments: No specialty comments available.  Procedures:  No procedures performed Allergies: Patient has no known allergies.   Assessment / Plan:     Visit Diagnoses: No diagnosis found.  ***  Orders: No orders of the defined types were placed in this encounter.  No orders of the defined types were placed in this encounter.    Follow-Up Instructions: No follow-ups on file.   Fuller Plan,  MD  Note - This record has been created using [employer].  Chart creation errors have been sought, but may not always  have been located. Such creation errors do not reflect on  the standard of medical care.

## 2020-09-05 ENCOUNTER — Ambulatory Visit: Payer: BC Managed Care – PPO | Admitting: Internal Medicine

## 2020-09-24 NOTE — Progress Notes (Signed)
Office Visit Note  Patient: Ryan [name]             Date of Birth: 1965-09-30           MRN: [id number]             PCP: [facility] Referring: Roe Coombs* Visit Date: 09/25/2020   Subjective:  Follow-up (Patient complains of continued joint and muscle aches. )   History of Present Illness: Ryan [name] is a 55 y.o. male here for follow up joint pain of multiple sites with positive dsDNA and RF without inflammatory joint changes. His symptoms seemed partially better compared to a few months ago, possible relation to his multiple medications so recommended we just observe at that time without any new treatment start. Since that time he continues having pain and stiffness on a daily basis that is no better or in some areas worse. The biggest complaint is in his distal legs there is a feeling of burning sensation in the calf muscles. He feels they get very warm sometiems and feel swollen or boggy to the touch. He has not noticed obvious visible changes.   Review of Systems  Constitutional: Positive for fatigue.  HENT: Negative for mouth sores, mouth dryness and nose dryness.   Eyes: Negative for pain, itching, visual disturbance and dryness.  Respiratory: Negative for cough, hemoptysis, shortness of breath and difficulty breathing.   Cardiovascular: Positive for chest pain. Negative for palpitations and swelling in legs/feet.  Gastrointestinal: Negative for abdominal pain, blood in stool, constipation and diarrhea.  Endocrine: Negative for increased urination.  Genitourinary: Negative for painful urination.  Musculoskeletal: Positive for arthralgias, joint pain, joint swelling, myalgias, muscle weakness, morning stiffness, muscle tenderness and myalgias.  Skin: Negative for color change, rash and redness.  Allergic/Immunologic: Negative for susceptible to infections.  Neurological: Positive for weakness. Negative for dizziness, numbness,  headaches and memory loss.  Hematological: Negative for swollen glands.  Psychiatric/Behavioral: Positive for sleep disturbance. Negative for confusion.     Previous HPI: Ryan [name] is a 55 y.o. male here for evaluation of arthritis with positive RF and dsDNA. He has chronic history of mild joint pains in multiple sites, and history of left THA. More recently he was evaluated for Afib with unstable angina with additional of multiple medications including flecainide, xarelto, lipitor, and others. 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 He has stiffness in the hands lasting around 30 minutes in AM and pain that worsens with prolonged use during the day. But more recently he developed increase in pain in the back, hips, legs that is not localized to the joints but aches all over the muscles and bones. This worsens when standing all day. When he is able to exercise and stretch more such as playing a round of golf he notices some improvement following the activity. He is no taking medication for this. He discontinued taking the medications for the afib about a month ago.  Labs reviewed ANA pos dsDNA 22 RF 17.4   PMFS History:  Patient Active Problem List   Diagnosis Date Noted  . Bilateral leg pain 09/25/2020  . Low back pain 09/25/2020  . Positive ANA (antinuclear antibody) 08/09/2020  . Rash 03/07/2020  . Tinea cruris 03/07/2020  . Chronic pain of multiple joints 10/28/2019  . GERD (gastroesophageal reflux disease) 04/06/2019  . Anticoagulated 03/25/2019  . Drug reaction 01/26/2019  . Obstructive sleep apnea 01/26/2019  .  Normal coronary arteries 01/26/2019  . Unstable angina (HCC)   . Persistent atrial fibrillation (HCC)   . Snoring 12/03/2018  . Dyspnea on exertion 12/03/2018  . H/O total knee replacement, right 11/11/2018  . Osteoarthritis of left knee 06/12/2018  . Cellulitis of neck 03/11/2017  . Hyperlipidemia 11/07/2015  .  Plantar fasciitis 08/08/2015  . Overweight (BMI 25.0-29.9) 02/24/2013  . S/P left THA, AA 02/23/2013    Past Medical History:  Diagnosis Date  . Anxiety   . Arthritis    oa  . Chest pain since 02-11-2013   pt thinks associated with reflux  . Dyspnea    with exertion  . GERD (gastroesophageal reflux disease)   . H/O hiatal hernia     Family History  Problem Relation Age of Onset  . Atrial fibrillation Father   . ALS Father   . Healthy Son   . Healthy Son   . Healthy Daughter    Past Surgical History:  Procedure Laterality Date  . ANKLE SURGERY Right    reconstruction to muscle  . ATRIAL FIBRILLATION ABLATION N/A 03/30/2020   Procedure: ATRIAL FIBRILLATION ABLATION;  Surgeon: Lanier Prude, MD;  Location: [facility] INVASIVE CV LAB;  Service: Cardiovascular;  Laterality: N/A;  . CARDIOVERSION N/A 02/25/2019   Procedure: CARDIOVERSION;  Surgeon: Little Ishikawa, MD;  Location: [facility] ENDOSCOPY;  Service: Endoscopy;  Laterality: N/A;  . CARDIOVERSION N/A 04/12/2019   Procedure: CARDIOVERSION;  Surgeon: Lars Masson, MD;  Location: [facility] ENDOSCOPY;  Service: Cardiovascular;  Laterality: N/A;  . ESOPHAGEAL MANOMETRY N/A 04/17/2015   Procedure: ESOPHAGEAL MANOMETRY (EM);  Surgeon: Charna Elizabeth, MD;  Location: [facility] ENDOSCOPY;  Service: Endoscopy;  Laterality: N/A;  . ESOPHAGOGASTRODUODENOSCOPY  11/09/2003  . FOOT SURGERY     right  . HIP SURGERY  age 65   left, done x 2  . KNEE ARTHROSCOPY Left 10/20/2003 and 2012  . LEFT HEART CATH AND CORONARY ANGIOGRAPHY N/A 12/29/2018   Procedure: LEFT HEART CATH AND CORONARY ANGIOGRAPHY;  Surgeon: Lennette Bihari, MD;  Location: [facility] INVASIVE CV LAB;  Service: Cardiovascular;  Laterality: N/A;  . MYRINGOTOMY WITH TUBE PLACEMENT Right 08/29/2015   Procedure: MYRINGOTOMY WITH TUBE PLACEMENT;  Surgeon: Newman Pies, MD;  Location: [facility];  Service: ENT;  Laterality: Right;  . PH IMPEDANCE STUDY N/A 04/17/2015   Procedure: PH IMPEDANCE  STUDY;  Surgeon: Charna Elizabeth, MD;  Location: [facility] ENDOS[facility];  Service: Endoscopy;  Laterality: N/A;  . SEPTOPLASTY N/A 08/29/2015   Procedure: SEPTOPLASTY;  Surgeon: Newman Pies, MD;  Location: [facility];  Service: ENT;  Laterality: N/A;  . TOTAL HIP ARTHROPLASTY Left 02/23/2013   Procedure: LEFT TOTAL HIP ARTHROPLASTY ANTERIOR APPROACH;  Surgeon: Shelda Pal, MD;  Location: [facility];  Service: Orthopedics;  Laterality: Left;  . TOTAL KNEE ARTHROPLASTY Right 11/11/2018   Procedure: TOTAL KNEE ARTHROPLASTY;  Surgeon: [name], MD;  Location: [facility];  Service: Orthopedics;  Laterality: Right;   . TURBINATE REDUCTION Bilateral 08/29/2015   Procedure: TURBINATE REDUCTION;  Surgeon: Newman Pies, MD;  Location: [facility];  Service: ENT;  Laterality: Bilateral;  . URETHRA SURGERY   67yrs ago   for scar tissue   Social History   Social History Narrative  . Not on file   Immunization History  Administered Date(s) Administered  . PFIZER(Purple Top)SARS-COV-2 Vaccination 02/24/2020, 03/16/2020  . Pneumococcal Polysaccharide-23 11/02/2012  . Td 11/02/2012  . Tdap 07/20/2018  . Zoster 09/15/2012  Objective: Vital Signs: BP (!) 140/99 (BP Location: Right Arm, Patient Position: Sitting, Cuff Size: Normal)   Pulse 69   Ht 6\' 2"  (1.88 m)   Wt 219 lb 9.6 oz (99.6 kg)   BMI 28.19 kg/m    Physical Exam Eyes:     Conjunctiva/sclera: Conjunctivae normal.  Skin:    General: Skin is warm and dry.     Findings: No rash.  Neurological:     General: No focal deficit present.     Mental Status: He is alert.     Deep Tendon Reflexes: Reflexes normal.  Psychiatric:        Mood and Affect: Mood normal.      Musculoskeletal Exam:  Shoulders full ROM no tenderness or swelling Elbows full ROM no tenderness or swelling Wrists full ROM no tenderness or swelling Fingers heberdon's nodes on multiple fingers, palmar calluses possible early flexor tendon nodules Hip  normal internal and external rotation without pain, no tenderness to lateral hip palpation Knees full ROM no tenderness or swelling Tenderness to pressure over posterior calf with no tenderness to sides of leg or foot dorsiflexion or plantarflexion, no warmth or erythema    Investigation: No additional findings.  Imaging: XR Lumbar Spine 2-3 Views  Result Date: 09/27/2020 Xray lumbar spine 2 views Left hip replacement hardware is in place. Right hip mild joint space narrowing and sclerosis. SI joints appear patent bilaterally. Scoliosis of lumbar spine present. Anterior osteophytes present with no bridging or syndesmophytes. No significant antero or retrolisthesis. Some posterior lateral bridging osteophytes or fusion at L2-L3. Impression Multiple levels of degenerative changes without obvious inflammatory changes and without ankylosis, no obvious cause of bilateral leg sensory change   Recent Labs: Lab Results  Component Value Date   WBC 7.9 03/02/2020   HGB 12.9 (L) 03/02/2020   PLT 219 03/02/2020   NA 143 03/02/2020   K 4.4 03/02/2020   CL 107 (H) 03/02/2020   CO2 24 03/02/2020   GLUCOSE 81 03/02/2020   BUN 12 03/02/2020   CREATININE 1.00 03/02/2020   BILITOT 0.3 12/23/2018   ALKPHOS 97 12/23/2018   AST 13 12/23/2018   ALT 19 12/23/2018   PROT 6.7 12/23/2018   ALBUMIN 4.8 12/23/2018   CALCIUM 9.2 03/02/2020   GFRAA 99 03/02/2020    Speciality Comments: No specialty comments available.  Procedures:  No procedures performed Allergies: Patient has no known allergies.   Assessment / Plan:     Visit Diagnoses: Positive ANA (antinuclear antibody) - Plan: Anti-DNA antibody, double-stranded, C3 and C4, Rheumatoid factor, Sedimentation rate, CK  Positive ANA without obvious inflammatory changes but a lot of joint pain in atypical distribution and associated with a lot of fatigue. Repeating dsDNA, complements, RF, also checking ESR and CK for evidence of inflammation. If more  evidence of inflammation may try empiric treatment as he does not present a clinical syndrome.  Bilateral leg pain - Plan: Sedimentation rate, Iron, TIBC and Ferritin Panel, Vitamin B12, CK  Bilateral leg pain mostly in calf areas without much associated change. Checing ESR, iron studies, CK, vitamin B12 for several possible causes. Burning pain characterization sound suggestive for neuropathic involvement.  Chronic midline low back pain with bilateral sciatica - Plan: XR Lumbar Spine 2-3 Views  History of chronic low back pain with sciatica before, although current symptoms are atypical with being bilateral. Sill, central problem could contribute so checking xray.   Orders: Orders Placed This Encounter  Procedures  . XR Lumbar Spine  2-3 Views  . Anti-DNA antibody, double-stranded  . C3 and C4  . Rheumatoid factor  . Sedimentation rate  . Iron, TIBC and Ferritin Panel  . Vitamin B12  . CK   No orders of the defined types were placed in this encounter.    Follow-Up Instructions: No follow-ups on file.   Fuller Plan, MD  Note - This record has been created using [employer].  Chart creation errors have been sought, but may not always  have been located. Such creation errors do not reflect on  the standard of medical care.

## 2020-09-25 ENCOUNTER — Encounter: Payer: Self-pay | Admitting: Internal Medicine

## 2020-09-25 ENCOUNTER — Ambulatory Visit: Payer: Self-pay

## 2020-09-25 ENCOUNTER — Ambulatory Visit: Payer: BC Managed Care – PPO | Admitting: Internal Medicine

## 2020-09-25 ENCOUNTER — Other Ambulatory Visit: Payer: Self-pay

## 2020-09-25 VITALS — BP 140/99 | HR 69 | Ht 74.0 in | Wt 219.6 lb

## 2020-09-25 DIAGNOSIS — M79605 Pain in left leg: Secondary | ICD-10-CM

## 2020-09-25 DIAGNOSIS — M79604 Pain in right leg: Secondary | ICD-10-CM

## 2020-09-25 DIAGNOSIS — G8929 Other chronic pain: Secondary | ICD-10-CM | POA: Diagnosis not present

## 2020-09-25 DIAGNOSIS — M5441 Lumbago with sciatica, right side: Secondary | ICD-10-CM

## 2020-09-25 DIAGNOSIS — M5442 Lumbago with sciatica, left side: Secondary | ICD-10-CM

## 2020-09-25 DIAGNOSIS — M545 Low back pain, unspecified: Secondary | ICD-10-CM | POA: Insufficient documentation

## 2020-09-25 DIAGNOSIS — R768 Other specified abnormal immunological findings in serum: Secondary | ICD-10-CM

## 2020-09-26 LAB — IRON,TIBC AND FERRITIN PANEL
%SAT: 30 % (calc) (ref 20–48)
Ferritin: 87 ng/mL (ref 38–380)
Iron: 103 ug/dL (ref 50–180)
TIBC: 342 mcg/dL (calc) (ref 250–425)

## 2020-09-26 LAB — ANTI-DNA ANTIBODY, DOUBLE-STRANDED: ds DNA Ab: 27 IU/mL — ABNORMAL HIGH

## 2020-09-26 LAB — RHEUMATOID FACTOR: Rheumatoid fact SerPl-aCnc: 21 IU/mL — ABNORMAL HIGH (ref <14)

## 2020-09-26 LAB — C3 AND C4
C3 Complement: 116 mg/dL (ref 82–185)
C4 Complement: 30 mg/dL (ref 15–53)

## 2020-09-26 LAB — SEDIMENTATION RATE: Sed Rate: 2 mm/h (ref 0–20)

## 2020-09-26 LAB — CK: Total CK: 217 U/L — ABNORMAL HIGH (ref 44–196)

## 2020-09-26 LAB — VITAMIN B12: Vitamin B-12: 324 pg/mL (ref 200–1100)

## 2020-10-16 IMAGING — CT CT HEART MORPH/PULM VEIN W/ CM & W/O CA SCORE
1 series · 13 of 18 positions shown, 17 images · non-contrast
Comparison: 07/27/2018 plain film.  04/18/2014 CT
COMPARISON: 07/27/2018 plain film.  04/18/2014 CT

Addendum:
EXAM:
OVER-READ INTERPRETATION  CT CHEST

The following report is an over-read performed by radiologist Dr.
Jeorya Nataren [[email or web address]] on 03/28/2020. This over-read
does not include interpretation of cardiac or coronary anatomy or
pathology. The coronary CTA interpretation by the cardiologist is
attached.
CLINICAL DATA: Atrial fibrillation scheduled for an ablation.
Cardiac CT/CTA
TECHNIQUE: A 120 kV prospective scan was triggered in the ascending thoracic
aorta at 140 HU's. Gantry rotation speed was 250 msecs and
collimation was .6 mm. No beta blockade and no NTG was given. The 3D
data set was reconstructed for best systolic and diastolic phases
along with delayed images of the CANDIS Images analyzed on a dedicated
work station using MPR, MIP and VRT modes. The patient received 80
cc of contrast.

[Series 472: findings · 0.32mm/px · 13 of 18 slices shown, 17 images]
[im 2/18  vessel]
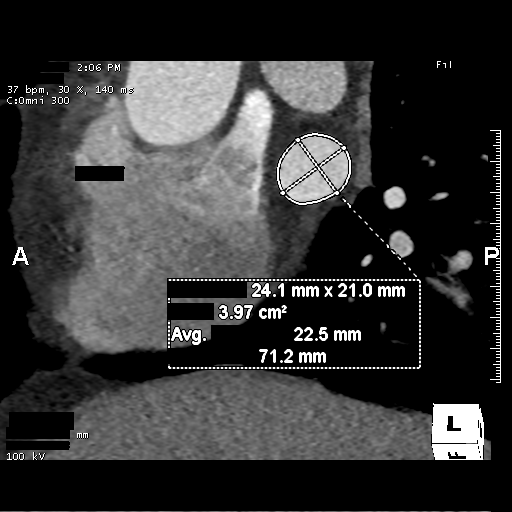
[im 2/18  lung]
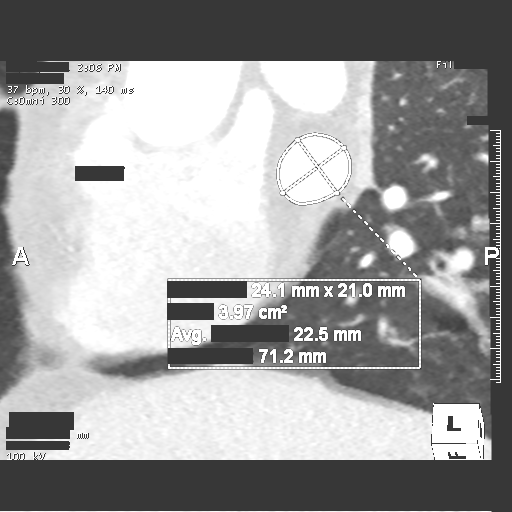
[im 3/18  vessel]
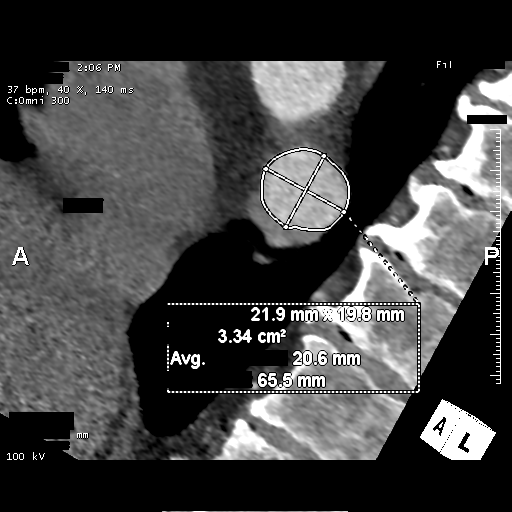
[im 5/18  vessel]
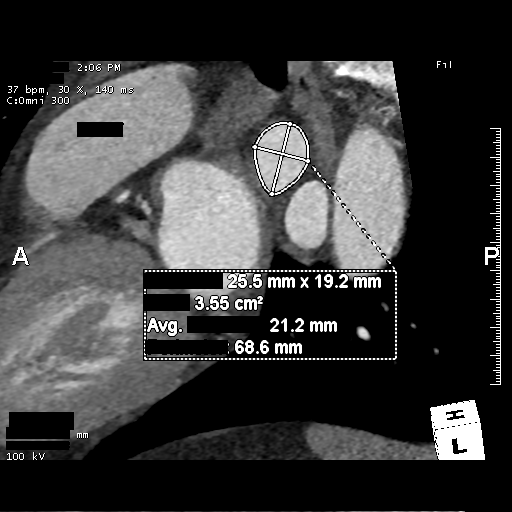
[im 6/18  vessel]
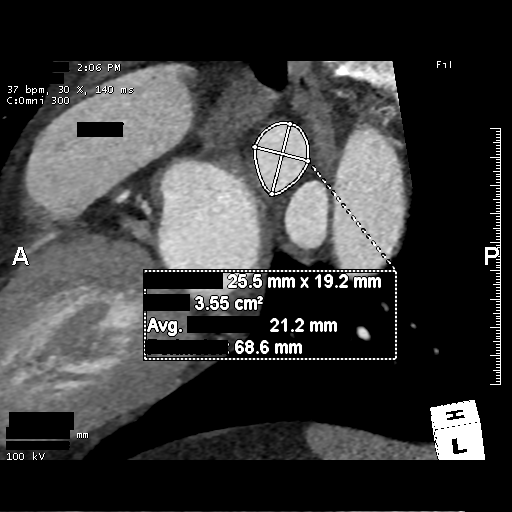
[im 7/18  vessel]
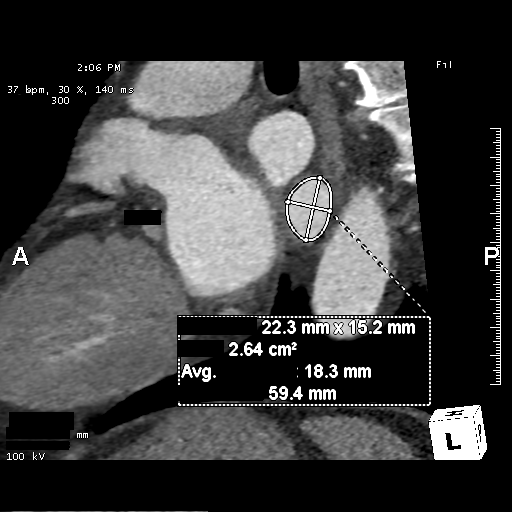
[im 7/18  lung]
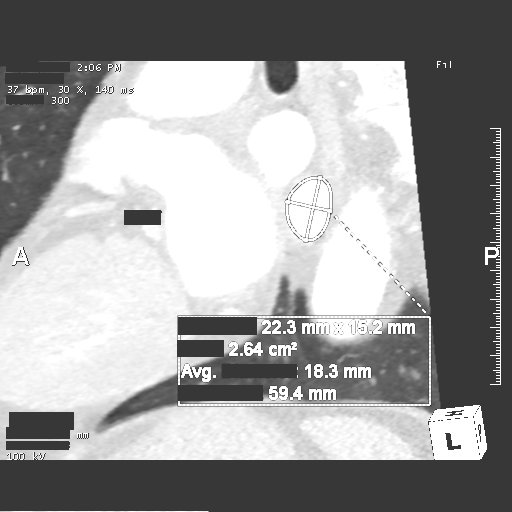
[im 8/18  vessel]
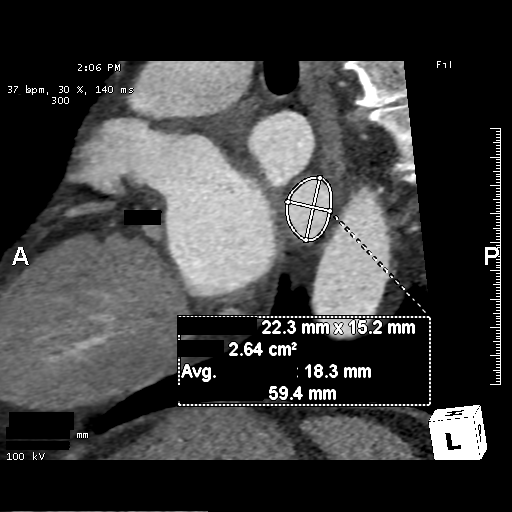
[im 10/18  vessel]
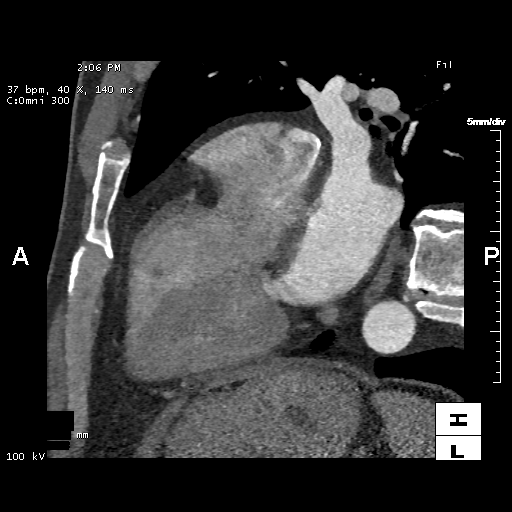
[im 11/18  vessel]
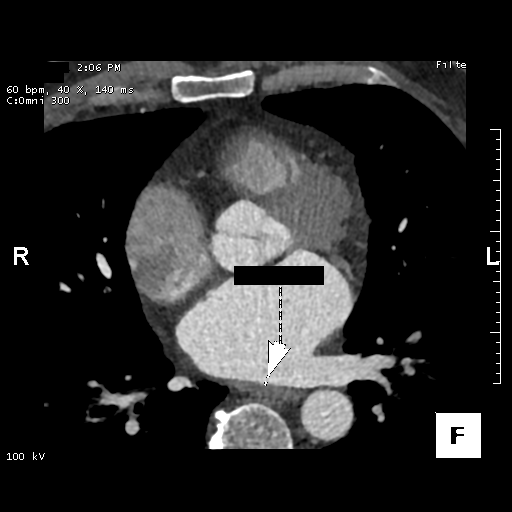
[im 12/18  vessel]
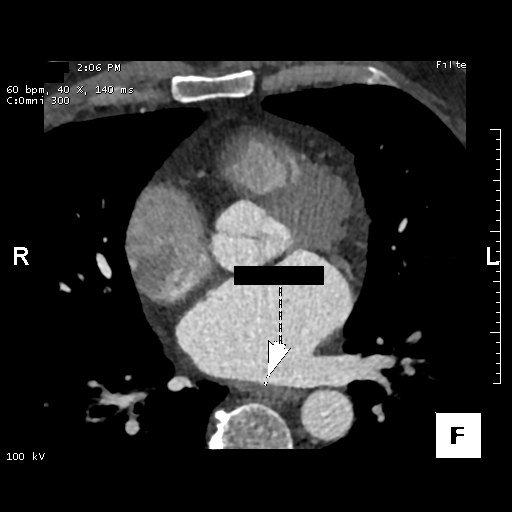
[im 12/18  lung]
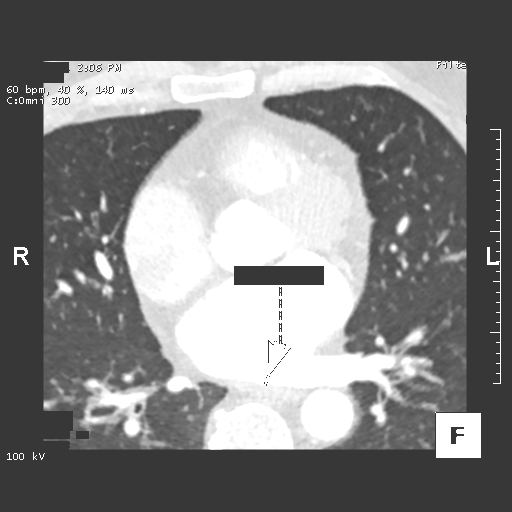
[im 13/18  vessel]
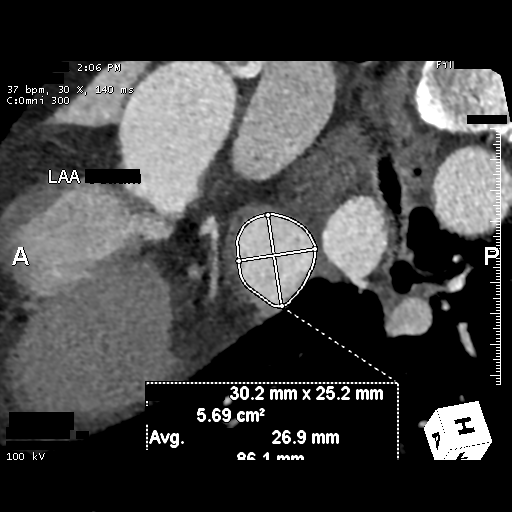
[im 14/18  vessel]
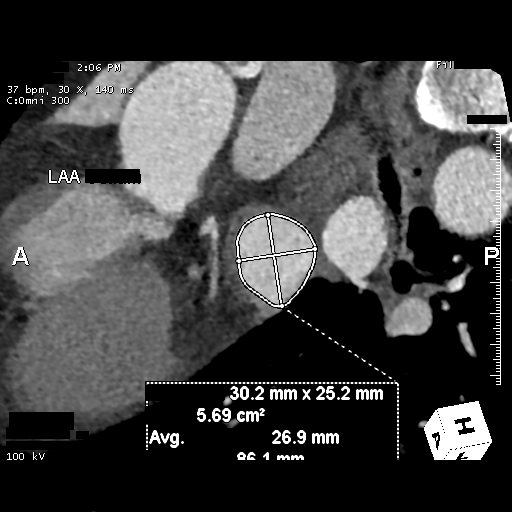
[im 16/18  vessel]
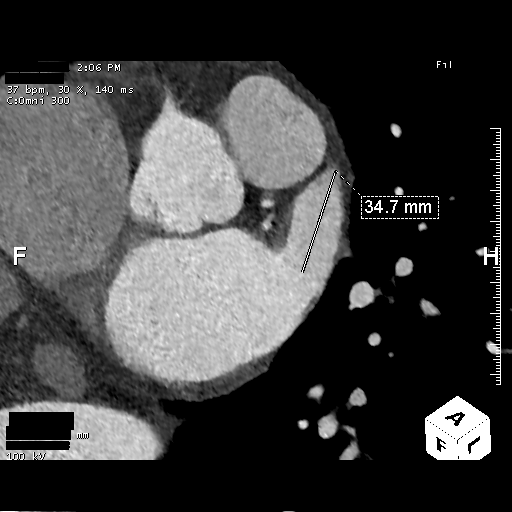
[im 17/18  vessel]
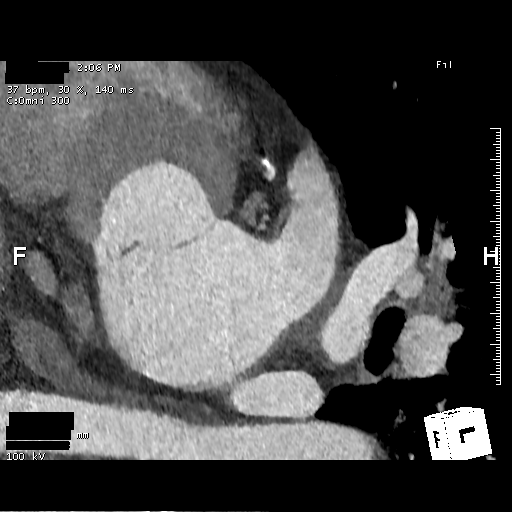
[im 17/18  lung]
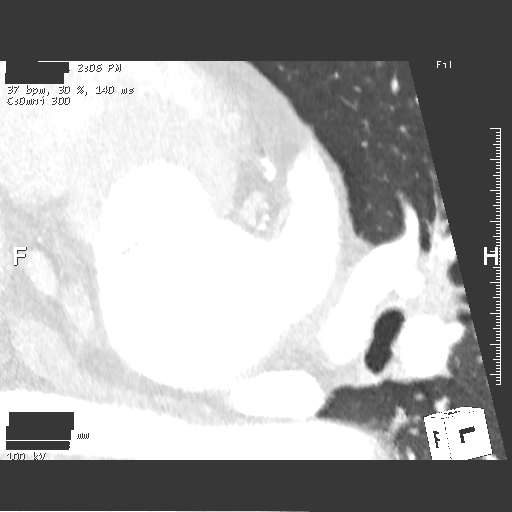

[13 of 18 positions shown; findings below may reference images not displayed]

FINDINGS: Vascular: Aortic atherosclerosis.  Tortuous thoracic aorta.

No central pulmonary embolism, on this non-dedicated study.

Mediastinum/Nodes: No imaged thoracic adenopathy.

Lungs/Pleura: No pleural fluid.  Clear imaged lungs.

Upper Abdomen: Normal imaged portions of the liver, spleen, stomach.

Musculoskeletal: Midthoracic spondylosis.
IMPRESSION: 1. No acute findings in the imaged extracardiac chest.
2. Aortic Atherosclerosis (SI4B6-F3F.F).
FINDINGS: Image quality: Excellent.

Noise artifact is: Limited.

Pulmonary Veins: There is normal pulmonary vein drainage into the
left atrium (2 on the right and 2 on the left) with ostial
measurements as follows:

RUPV: Ostium 24 mm x 21 mm  area 3.97 cm2

RLPV:  Ostium 22 mm x 20 mm  area 3.34 cm2

LUPV:  Ostium 25 mm x 19 mm area 3.55 cm2

LLPV:  Ostium 22 mm x 15 mm  area 2.64 cm2

Left Atrium: The left atrial size is dilated. There is a small PFO.
The left atrial appendage is a large windsock type with one large
lobe and ostial size 30 x 25 mm and length 35 mm. There is no
thrombus in the left atrial appendage on contrast or delayed
imaging. The esophagus runs in close proximity to the left inferior
pulmonary vein ostium.

Coronary Arteries: CAC score of 59, which is 77th percentile for
age- and sex-matched controls. Normal coronary origin. Right
dominance. The study was performed without use of NTG and is
insufficient for plaque evaluation.

Right Atrium: Right atrial size is within normal limits.

Right Ventricle: The right ventricular cavity is within normal
limits.

Left Ventricle: The ventricular cavity size is within normal limits.
There are no stigmata of prior infarction. There is no abnormal
filling defect.

Pericardium: Normal thickness with no significant effusion or
calcium present.

Pulmonary Artery: Normal caliber without proximal filling defect.

Cardiac valves: The aortic valve is trileaflet without significant
calcification. The mitral valve is normal structure without
significant calcification.

Aorta: Normal caliber with no significant disease.

Extra-cardiac findings: See attached radiology report for
non-cardiac structures.
IMPRESSION: 1. There is normal pulmonary vein drainage into the left atrium with
ostial measurements above.

2. There is no thrombus in the left atrial appendage.

3. The esophagus runs in close proximity to the left inferior
pulmonary vein ostium.

4. A small PFO is present.

5. Normal coronary origin. Right dominance.

6. CAC score of 59 which is 77th percentile for age- and sex-matched
controls.

*** End of Addendum ***
EXAM:
OVER-READ INTERPRETATION  CT CHEST

The following report is an over-read performed by radiologist Dr.
Jeorya Nataren [[email or web address]] on 03/28/2020. This over-read
does not include interpretation of cardiac or coronary anatomy or
pathology. The coronary CTA interpretation by the cardiologist is
attached.
FINDINGS: Vascular: Aortic atherosclerosis.  Tortuous thoracic aorta.

No central pulmonary embolism, on this non-dedicated study.

Mediastinum/Nodes: No imaged thoracic adenopathy.

Lungs/Pleura: No pleural fluid.  Clear imaged lungs.

Upper Abdomen: Normal imaged portions of the liver, spleen, stomach.

Musculoskeletal: Midthoracic spondylosis.
IMPRESSION: 1. No acute findings in the imaged extracardiac chest.
2. Aortic Atherosclerosis (SI4B6-F3F.F).

## 2021-08-30 NOTE — Progress Notes (Deleted)
? ?Office Visit Note ? ?Patient: Ryan [name]             ?Date of Birth: 08-26-1965           ?MRN: [id number]             ?PCP: [facility] ?Referring: Roe Coombs* ?Visit Date: 08/31/2021 ? ? ?Subjective:  ?No chief complaint on file. ? ? ?History of Present Illness: Ryan [name] is a 56 y.o. male here for follow up with joint pains and particularly lower extremity symptoms with pain worse at rest and improved with exertion. Lab testing with low positive RF and dsDNA but no obvious inflammation on exam last year. ***  ? ?Interval labs reviewed ?CK 878 ? ?Previous HPI ?09/25/20 ?Ryan [name] is a 56 y.o. male here for follow up joint pain of multiple sites with positive dsDNA and RF without inflammatory joint changes. His symptoms seemed partially better compared to a few months ago, possible relation to his multiple medications so recommended we just observe at that time without any new treatment start. Since that time he continues having pain and stiffness on a daily basis that is no better or in some areas worse. The biggest complaint is in his distal legs there is a feeling of burning sensation in the calf muscles. He feels they get very warm sometiems and feel swollen or boggy to the touch. He has not noticed obvious visible changes. ? ?Previous HPI ?08/08/20 ?Ryan [name] is a 56 y.o. male here for evaluation of arthritis with positive RF and dsDNA. He has chronic history of mild joint pains in multiple sites, and history of left THA. More recently he was evaluated for Afib with unstable angina with additional of multiple medications including flecainide, xarelto, lipitor, and others. 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 He has stiffness in the hands lasting around 30 minutes in AM and pain that worsens with prolonged use during the day. But more recently he developed increase in pain in the back, hips, legs  that is not localized to the joints but aches all over the muscles and bones. This worsens when standing all day. When he is able to exercise and stretch more such as playing a round of golf he notices some improvement following the activity. He is no taking medication for this. He discontinued taking the medications for the afib about a month ago. ?  ?Labs reviewed ?ANA pos ?dsDNA 22 ?RF 17.4 ? ?No Rheumatology ROS completed.  ? ?PMFS History:  ?Patient Active Problem List  ? Diagnosis Date Noted  ? Bilateral leg pain 09/25/2020  ? Low back pain 09/25/2020  ? Positive ANA (antinuclear antibody) 08/09/2020  ? Rash 03/07/2020  ? Tinea cruris 03/07/2020  ? Chronic pain of multiple joints 10/28/2019  ? GERD (gastroesophageal reflux disease) 04/06/2019  ? Anticoagulated 03/25/2019  ? Drug reaction 01/26/2019  ? Obstructive sleep apnea 01/26/2019  ? Normal coronary arteries 01/26/2019  ? Unstable angina (HCC)   ? Persistent atrial fibrillation (HCC)   ? Snoring 12/03/2018  ? Dyspnea on exertion 12/03/2018  ? H/O total knee replacement, right 11/11/2018  ? Osteoarthritis of left knee 06/12/2018  ? Cellulitis of neck 03/11/2017  ? Hyperlipidemia 11/07/2015  ? Plantar fasciitis 08/08/2015  ? Overweight (BMI 25.0-29.9) 02/24/2013  ? S/P left THA, AA 02/23/2013  ?  ?Past Medical History:  ?Diagnosis Date  ? Anxiety   ? Arthritis   ?  oa  ? Chest pain since 02-11-2013  ? pt thinks associated with reflux  ? Dyspnea   ? with exertion  ? GERD (gastroesophageal reflux disease)   ? H/O hiatal hernia   ?  ?Family History  ?Problem Relation Age of Onset  ? Atrial fibrillation Father   ? ALS Father   ? Healthy Son   ? Healthy Son   ? Healthy Daughter   ? ?Past Surgical History:  ?Procedure Laterality Date  ? ANKLE SURGERY Right   ? reconstruction to muscle  ? ATRIAL FIBRILLATION ABLATION N/A 03/30/2020  ? Procedure: ATRIAL FIBRILLATION ABLATION;  Surgeon: Lanier Prude, MD;  [facility] INVASIVE CV LAB;  Service: Cardiovascular;   Laterality: N/A;  ? CARDIOVERSION N/A 02/25/2019  ? Procedure: CARDIOVERSION;  Surgeon: Little Ishikawa, MD;  Location: [facility] ENDOSCOPY;  Service: Endoscopy;  Laterality: N/A;  ? CARDIOVERSION N/A 04/12/2019  ? Procedure: CARDIOVERSION;  Surgeon: Lars Masson, MD;  Location: [facility] ENDOSCOPY;  Service: Cardiovascular;  Laterality: N/A;  ? ESOPHAGEAL MANOMETRY N/A 04/17/2015  ? Procedure: ESOPHAGEAL MANOMETRY (EM);  Surgeon: Charna Elizabeth, MD;  Location: [facility] ENDOSCOPY;  Service: Endoscopy;  Laterality: N/A;  ? ESOPHAGOGASTRODUODENOSCOPY  11/09/2003  ? FOOT SURGERY    ? right  ? HIP SURGERY  age 20  ? left, done x 2  ? KNEE ARTHROSCOPY Left 10/20/2003 and 2012  ? LEFT HEART CATH AND CORONARY ANGIOGRAPHY N/A 12/29/2018  ? Procedure: LEFT HEART CATH AND CORONARY ANGIOGRAPHY;  Surgeon: Lennette Bihari, MD;  Location: [facility] INVASIVE CV LAB;  Service: Cardiovascular;  Laterality: N/A;  ? MYRINGOTOMY WITH TUBE PLACEMENT Right 08/29/2015  ? Procedure: MYRINGOTOMY WITH TUBE PLACEMENT;  Surgeon: Newman Pies, MD;  Location: [facility];  Service: ENT;  Laterality: Right;  ? PH IMPEDANCE STUDY N/A 04/17/2015  ? Procedure: PH IMPEDANCE STUDY;  Surgeon: Charna Elizabeth, MD;  Location: [facility] ENDOS[facility];  Service: Endoscopy;  Laterality: N/A;  ? SEPTOPLASTY N/A 08/29/2015  ? Procedure: SEPTOPLASTY;  Surgeon: Newman Pies, MD;  Location: [facility];  Service: ENT;  Laterality: N/A;  ? TOTAL HIP ARTHROPLASTY Left 02/23/2013  ? Procedure: LEFT TOTAL HIP ARTHROPLASTY ANTERIOR APPROACH;  Surgeon: Shelda Pal, MD;  Location: [facility];  Service: Orthopedics;  Laterality: Left;  ? TOTAL KNEE ARTHROPLASTY Right 11/11/2018  ? Procedure: TOTAL KNEE ARTHROPLASTY;  Surgeon: [name], MD;  Location: [facility];  Service: Orthopedics;  Laterality: Right;   ? TURBINATE REDUCTION Bilateral 08/29/2015  ? Procedure: TURBINATE REDUCTION;  Surgeon: Newman Pies, MD;  Location: [facility];  Service: ENT;  Laterality: Bilateral;  ?  URETHRA SURGERY   4yrs ago  ? for scar tissue  ? ?Social History  ? ?Social History Narrative  ? Not on file  ? ?Immunization History  ?Administered Date(s) Administered  ? PFIZER(Purple Top)SARS-COV-2 Vaccination 02/24/2020, 03/16/2020  ? Pneumococcal Polysaccharide-23 11/02/2012  ? Td 11/02/2012  ? Tdap 07/20/2018  ? Zoster, Live 09/15/2012  ?  ? ?Objective: ?Vital Signs: There were no vitals taken for this visit.  ? ?Physical Exam  ? ?Musculoskeletal Exam: *** ? ?CDAI Exam: ?CDAI Score: -- ?Patient Global: --; Provider Global: -- ?Swollen: --; Tender: -- ?Joint Exam 08/31/2021  ? ?No joint exam has been documented for this visit  ? ?There is currently no information documented on the homunculus. Go to the Rheumatology activity and complete the homunculus joint exam. ? ?Investigation: ?No additional findings. ? ?Imaging: ?No results found. ? ?Recent Labs: ?Lab  Results  ?Component Value Date  ? WBC 7.9 03/02/2020  ? HGB 12.9 (L) 03/02/2020  ? PLT 219 03/02/2020  ? NA 143 03/02/2020  ? K 4.4 03/02/2020  ? CL 107 (H) 03/02/2020  ? CO2 24 03/02/2020  ? GLUCOSE 81 03/02/2020  ? BUN 12 03/02/2020  ? CREATININE 1.00 03/02/2020  ? BILITOT 0.3 12/23/2018  ? ALKPHOS 97 12/23/2018  ? AST 13 12/23/2018  ? ALT 19 12/23/2018  ? PROT 6.7 12/23/2018  ? ALBUMIN 4.8 12/23/2018  ? CALCIUM 9.2 03/02/2020  ? GFRAA 99 03/02/2020  ? ? ?Speciality Comments: No specialty comments available. ? ?Procedures:  ?No procedures performed ?Allergies: Patient has no known allergies.  ? ?Assessment / Plan:     ?Visit Diagnoses: No diagnosis found. ? ?*** ? ?Orders: ?No orders of the defined types were placed in this encounter. ? ?No orders of the defined types were placed in this encounter. ? ? ? ?Follow-Up Instructions: No follow-ups on file. ? ? ?Fuller Plan, MD ? ?Note - This record has been created using [employer].  ?Chart creation errors have been sought, but may not always  ?have been located. Such creation errors do not reflect  on  ?the standard of medical care. ? ?

## 2021-08-31 ENCOUNTER — Ambulatory Visit: Payer: BC Managed Care – PPO | Admitting: Internal Medicine

## 2021-10-08 NOTE — Progress Notes (Deleted)
? ? ?Office Visit  ?  ?Patient Name: Ryan [name] ?Date of Encounter: 10/08/2021 ? ?Primary Care Provider:  [facility] ?Primary Cardiologist:  Parke Poisson, MD ? ?Chief Complaint  ?  ?56 year old male with a history of persistent atrial fibrillation s/p ablation, GERD, and arthritis who presents for follow-up related to atrial fibrillation.  ? ?Past Medical History  ?  ?Past Medical History:  ?Diagnosis Date  ? Anxiety   ? Arthritis   ? oa  ? Chest pain since 02-11-2013  ? pt thinks associated with reflux  ? Dyspnea   ? with exertion  ? GERD (gastroesophageal reflux disease)   ? H/O hiatal hernia   ? ?Past Surgical History:  ?Procedure Laterality Date  ? ANKLE SURGERY Right   ? reconstruction to muscle  ? ATRIAL FIBRILLATION ABLATION N/A 03/30/2020  ? Procedure: ATRIAL FIBRILLATION ABLATION;  Surgeon: Lanier Prude, MD;  Location: [facility] INVASIVE CV LAB;  Service: Cardiovascular;  Laterality: N/A;  ? CARDIOVERSION N/A 02/25/2019  ? Procedure: CARDIOVERSION;  Surgeon: Little Ishikawa, MD;  Location: [facility] ENDOSCOPY;  Service: Endoscopy;  Laterality: N/A;  ? CARDIOVERSION N/A 04/12/2019  ? Procedure: CARDIOVERSION;  Surgeon: Lars Masson, MD;  Location: [facility] ENDOSCOPY;  Service: Cardiovascular;  Laterality: N/A;  ? ESOPHAGEAL MANOMETRY N/A 04/17/2015  ? Procedure: ESOPHAGEAL MANOMETRY (EM);  Surgeon: Charna Elizabeth, MD;  Location: [facility] ENDOSCOPY;  Service: Endoscopy;  Laterality: N/A;  ? ESOPHAGOGASTRODUODENOSCOPY  11/09/2003  ? FOOT SURGERY    ? right  ? HIP SURGERY  age 19  ? left, done x 2  ? KNEE ARTHROSCOPY Left 10/20/2003 and 2012  ? LEFT HEART CATH AND CORONARY ANGIOGRAPHY N/A 12/29/2018  ? Procedure: LEFT HEART CATH AND CORONARY ANGIOGRAPHY;  Surgeon: Lennette Bihari, MD;  Location: [facility] INVASIVE CV LAB;  Service: Cardiovascular;  Laterality: N/A;  ? MYRINGOTOMY WITH TUBE PLACEMENT Right 08/29/2015  ? Procedure: MYRINGOTOMY WITH TUBE PLACEMENT;  Surgeon: Newman Pies, MD;   Location: [facility];  Service: ENT;  Laterality: Right;  ? PH IMPEDANCE STUDY N/A 04/17/2015  ? Procedure: PH IMPEDANCE STUDY;  Surgeon: Charna Elizabeth, MD;  Location: [facility] ENDOSCOPY;  Service: Endoscopy;  Laterality: N/A;  ? SEPTOPLASTY N/A 08/29/2015  ? Procedure: SEPTOPLASTY;  Surgeon: Newman Pies, MD;  Location: [facility];  Service: ENT;  Laterality: N/A;  ? TOTAL HIP ARTHROPLASTY Left 02/23/2013  ? Procedure: LEFT TOTAL HIP ARTHROPLASTY ANTERIOR APPROACH;  Surgeon: Shelda Pal, MD;  Location: [facility];  Service: Orthopedics;  Laterality: Left;  ? TOTAL KNEE ARTHROPLASTY Right 11/11/2018  ? Procedure: TOTAL KNEE ARTHROPLASTY;  Surgeon: [name], MD;  Location: [facility];  Service: Orthopedics;  Laterality: Right;   ? TURBINATE REDUCTION Bilateral 08/29/2015  ? Procedure: TURBINATE REDUCTION;  Surgeon: Newman Pies, MD;  Location: [facility];  Service: ENT;  Laterality: Bilateral;  ? URETHRA SURGERY   30yrs ago  ? for scar tissue  ? ? ?Allergies ? ?No Known Allergies ? ?History of Present Illness  ?  ?56 year old male with the above past medical history including persistent atrial fibrillation s/p ablation, GERD, and arthritis. ? ?Cardiac catheterization in June 2020 in the setting of chest pain showed no evidence of obstructive CAD.  He has a history of persistent atrial fibrillation s/p DCCV in August 2020.  He was back in A-fib at outpatient visit in September 2020.  Echocardiogram in September 2021 showed EF 60 to 65%, no RWMA, moderate  LAE.  Coronary CTA at the time showed calcium score 59 (77 percentile for age and sex matched controls), small PFO, no evidence of LAA thrombus.  He was initiated on statin therapy.  He underwent successful ablation for persistent atrial fibrillation in September 2021.  He has maintained sinus rhythm post ablation.  Flecainide and Xarelto were subsequently discontinued.  He last saw Dr. Lalla Brothers on 07/31/2020 at which time he discussed the  possibility of loop recorder for ongoing monitoring of A-fib recurrence, however, this was not pursued.  He did report significant leg pain at the time.  Has since been evaluated by orthopedics, neurology and rheumatology. ? ?He presents today for follow-up.  Since his last visit ? ?Persistent atrial fibrillation: ?Bilateral leg pain: ?Disposition: ?Home Medications  ?  ?Current Outpatient Medications  ?Medication Sig Dispense Refill  ? GABAPENTIN PO Take 2 tablets by mouth at bedtime.    ? terbinafine (LAMISIL) 250 MG tablet Take 1 tablet by mouth daily.    ? ?No current facility-administered medications for this visit.  ?  ? ?Review of Systems  ?  ?***.  All other systems reviewed and are otherwise negative except as noted above. ?  ? ?Physical Exam  ?  ?VS:  There were no vitals taken for this visit. , BMI There is no height or weight on file to calculate BMI. ?    ?GEN: Well nourished, well developed, in no acute distress. ?HEENT: normal. ?Neck: Supple, no JVD, carotid bruits, or masses. ?Cardiac: RRR, no murmurs, rubs, or gallops. No clubbing, cyanosis, edema.  Radials/DP/PT 2+ and equal bilaterally.  ?Respiratory:  Respirations regular and unlabored, clear to auscultation bilaterally. ?GI: Soft, nontender, nondistended, BS + x 4. ?MS: no deformity or atrophy. ?Skin: warm and dry, no rash. ?Neuro:  Strength and sensation are intact. ?Psych: Normal affect. ? ?Accessory Clinical Findings  ?  ?ECG personally reviewed by me today - *** - no acute changes. ? ?Lab Results  ?Component Value Date  ? WBC 7.9 03/02/2020  ? HGB 12.9 (L) 03/02/2020  ? HCT 37.1 (L) 03/02/2020  ? MCV 95 03/02/2020  ? PLT 219 03/02/2020  ? ?Lab Results  ?Component Value Date  ? CREATININE 1.00 03/02/2020  ? BUN 12 03/02/2020  ? NA 143 03/02/2020  ? K 4.4 03/02/2020  ? CL 107 (H) 03/02/2020  ? CO2 24 03/02/2020  ? ?Lab Results  ?Component Value Date  ? ALT 19 12/23/2018  ? AST 13 12/23/2018  ? ALKPHOS 97 12/23/2018  ? BILITOT 0.3 12/23/2018   ? ?No results found for: CHOL, HDL, LDLCALC, LDLDIRECT, TRIG, CHOLHDL  ?Lab Results  ?Component Value Date  ? HGBA1C 5.7 (H) 06/28/2020  ? ? ?Assessment & Plan  ?  ?1.  *** ? ? ?Joylene Grapes, NP ?10/08/2021, 1:23 PM ?  ? ?

## 2021-10-10 ENCOUNTER — Ambulatory Visit: Payer: BC Managed Care – PPO | Admitting: Nurse Practitioner

## 2021-10-15 ENCOUNTER — Encounter: Payer: Self-pay | Admitting: Nurse Practitioner

## 2023-03-11 ENCOUNTER — Inpatient Hospital Stay: Payer: BC Managed Care – PPO | Admitting: Hematology

## 2023-03-11 ENCOUNTER — Inpatient Hospital Stay: Payer: BC Managed Care – PPO

## 2023-03-12 NOTE — Progress Notes (Signed)
Contacted pt yon 03/11/23 regarding missed appt. Pt stated that he had already been seen at [facility] by Hem/Onc and did not need to be seen at [facility]. Appointment canceled.
